# Patient Record
Sex: Female | Born: 1990 | Race: Black or African American | Hispanic: No | Marital: Single | Smoking: Current every day smoker
Health system: Southern US, Community
[De-identification: ages and names within clinical notes are randomized; demographics above are authoritative.]

## PROBLEM LIST (undated history)

## (undated) DIAGNOSIS — R011 Cardiac murmur, unspecified: Secondary | ICD-10-CM

## (undated) HISTORY — PX: NO PAST SURGERIES: SHX2092

---

## 2012-10-09 ENCOUNTER — Encounter (HOSPITAL_COMMUNITY): Payer: Self-pay | Admitting: *Deleted

## 2012-10-09 ENCOUNTER — Emergency Department (HOSPITAL_COMMUNITY)
Admission: EM | Admit: 2012-10-09 | Discharge: 2012-10-10 | Discharge status: Against Medical Advice | Payer: No Typology Code available for payment source | Attending: Emergency Medicine | Admitting: Emergency Medicine

## 2012-10-09 ENCOUNTER — Emergency Department (HOSPITAL_COMMUNITY): Payer: No Typology Code available for payment source

## 2012-10-09 DIAGNOSIS — R079 Chest pain, unspecified: Secondary | ICD-10-CM | POA: Insufficient documentation

## 2012-10-09 DIAGNOSIS — R5381 Other malaise: Secondary | ICD-10-CM | POA: Insufficient documentation

## 2012-10-09 DIAGNOSIS — F17200 Nicotine dependence, unspecified, uncomplicated: Secondary | ICD-10-CM | POA: Insufficient documentation

## 2012-10-09 DIAGNOSIS — R011 Cardiac murmur, unspecified: Secondary | ICD-10-CM | POA: Insufficient documentation

## 2012-10-09 DIAGNOSIS — R42 Dizziness and giddiness: Secondary | ICD-10-CM | POA: Insufficient documentation

## 2012-10-09 DIAGNOSIS — R55 Syncope and collapse: Secondary | ICD-10-CM | POA: Insufficient documentation

## 2012-10-09 HISTORY — DX: Cardiac murmur, unspecified: R01.1

## 2012-10-09 LAB — POCT I-STAT, CHEM 8
BUN: 8 mg/dL (ref 6–23)
HCT: 47 % — ABNORMAL HIGH (ref 36.0–46.0)
Hemoglobin: 16 g/dL — ABNORMAL HIGH (ref 12.0–15.0)
Sodium: 140 mEq/L (ref 135–145)
TCO2: 23 mmol/L (ref 0–100)

## 2012-10-09 LAB — CBC WITH DIFFERENTIAL/PLATELET
Basophils Absolute: 0 10*3/uL (ref 0.0–0.1)
Eosinophils Absolute: 0 10*3/uL (ref 0.0–0.7)
Lymphs Abs: 5.1 10*3/uL — ABNORMAL HIGH (ref 0.7–4.0)
MCH: 28.3 pg (ref 26.0–34.0)
Neutrophils Relative %: 55 % (ref 43–77)
Platelets: 230 10*3/uL (ref 150–400)
RBC: 5.23 MIL/uL — ABNORMAL HIGH (ref 3.87–5.11)
RDW: 12.7 % (ref 11.5–15.5)
WBC: 13.7 10*3/uL — ABNORMAL HIGH (ref 4.0–10.5)

## 2012-10-09 LAB — GLUCOSE, CAPILLARY: Glucose-Capillary: 128 mg/dL — ABNORMAL HIGH (ref 70–99)

## 2012-10-09 LAB — TROPONIN I: Troponin I: <0.3 ng/mL (ref <0.30)

## 2012-10-09 MED ORDER — SODIUM CHLORIDE 0.9 % IV BOLUS (SEPSIS)
1000.0000 mL | Freq: Once | INTRAVENOUS | Status: AC
  Administered 2012-10-10: 1000 mL via INTRAVENOUS

## 2012-10-09 MED ORDER — POTASSIUM CHLORIDE CRYS ER 20 MEQ PO TBCR
40.0000 meq | EXTENDED_RELEASE_TABLET | Freq: Once | ORAL | Status: AC
  Administered 2012-10-10: 40 meq via ORAL
  Filled 2012-10-09: qty 2

## 2012-10-09 NOTE — ED Notes (Signed)
Bed:WA14<BR> Expected date:<BR> Expected time:<BR> Means of arrival:<BR> Comments:<BR> EMS

## 2012-10-09 NOTE — ED Provider Notes (Signed)
History     CSN: [id number]  Arrival date & time 10/09/12  2125   First MD Initiated Contact with Patient 10/09/12 2211      Chief Complaint  Patient presents with  . Loss of Consciousness    (Consider location/radiation/quality/duration/timing/severity/associated sxs/prior treatment) HPI Comments: Patient states she had 2 syncopal episodes.  This evening.  She reports, that she had very little to eat or drink over the and when she went out in the heat.  She felt weak and dizzy during the first episode the second episode.  She had gone in to the house into the cool, but still had a syncopal episode denies any injury.  She does, state that she has a history of his having had 2 previous episodes of acute heat, and not eating.  She is currently on her menstrual cycle  Patient is a 22 y.o. female presenting with syncope. The history is provided by the patient.  Loss of Consciousness Episode history:  Multiple Most recent episode:  Today Timing:  Sporadic Progression:  Unable to specify Chronicity:  Recurrent Context: dehydration   Witnessed: yes   Relieved by:  Nothing Worsened by:  Nothing tried Ineffective treatments:  None tried Associated symptoms: chest pain   Associated symptoms: no dizziness, no fever, no headaches, no nausea, no palpitations, no shortness of breath, no vomiting and no weakness     Past Medical History  Diagnosis Date  . Heart murmur     History reviewed. No pertinent past surgical history.  History reviewed. No pertinent family history.  History  Substance Use Topics  . Smoking status: Current Every Day Smoker -- 1.00 packs/day    Types: Cigarettes  . Smokeless tobacco: Not on file  . Alcohol Use: Yes    OB History   Grav Para Term Preterm Abortions TAB SAB Ect Mult Living                  Review of Systems  Constitutional: Negative for fever and chills.  HENT: Negative for congestion and rhinorrhea.   Respiratory: Negative for cough and  shortness of breath.   Cardiovascular: Positive for chest pain and syncope. Negative for palpitations and leg swelling.  Gastrointestinal: Negative for nausea and vomiting.  Musculoskeletal: Negative for back pain.  Skin: Negative for rash.  Neurological: Negative for dizziness, weakness and headaches.  All other systems reviewed and are negative.    Allergies  Review of patient's allergies indicates no known allergies.  Home Medications  No current outpatient prescriptions on file.  BP 130/95  Pulse 76  Temp(Src) 98 F (36.7 C) (Oral)  Resp 23  SpO2 99%  LMP 10/09/2012  Physical Exam  Nursing note and vitals reviewed. Constitutional: She is oriented to person, place, and time. She appears well-nourished.  HENT:  Head: Normocephalic.  Eyes: Pupils are equal, round, and reactive to light.  Neck: Normal range of motion.  Cardiovascular: Normal rate and regular rhythm.   Pulmonary/Chest: Effort normal and breath sounds normal.  Abdominal: Soft. Bowel sounds are normal.  Musculoskeletal: Normal range of motion.  Neurological: She is alert and oriented to person, place, and time.  Skin: Skin is warm and dry.    ED Course  Procedures (including critical care time)  Labs Reviewed  GLUCOSE, CAPILLARY - Abnormal; Notable for the following:    Glucose-Capillary 128 (*)    All other components within normal limits  CBC WITH DIFFERENTIAL - Abnormal; Notable for the following:    WBC 13.7 (*)  RBC 5.23 (*)    Lymphs Abs 5.1 (*)    Monocytes Absolute 1.1 (*)    All other components within normal limits  POCT I-STAT, CHEM 8 - Abnormal; Notable for the following:    Potassium 3.3 (*)    Hemoglobin 16.0 (*)    HCT 47.0 (*)    All other components within normal limits  TROPONIN I   Ct Head Wo Contrast  10/09/2012   *RADIOLOGY REPORT*  Clinical Data: Syncope.  CT HEAD WITHOUT CONTRAST  Technique:  Contiguous axial images were obtained from the base of the skull through the  vertex without contrast.  Comparison: None  Findings: The ventricles are normal.  No extra-axial fluid collections are seen.  The brainstem and cerebellum are unremarkable.  No acute intracranial findings such as infarction or hemorrhage.  No mass lesions.  The bony calvarium is intact.  The visualized paranasal sinuses and mastoid air cells are clear.  IMPRESSION: No acute intracranial findings or mass lesions.   Original Report Authenticated By: Rudie Meyer, M.D.     1. Syncope     ED ECG REPORT   Date: 10/10/2012  EKG Time: 12:56 AM  Rate: 80  Rhythm: normal sinus rhythm,  there are no previous tracings available for comparison  Axis: normal  Intervals:none  ST&T Change: normal  Narrative Interpretation: normal            MDM  Patient left before completion of exam        Arman Filter, NP 10/10/12 [phone]

## 2012-10-09 NOTE — ED Notes (Signed)
EMS called to home.  Found patient laying supine after syncopal episode. Witnesses state that this is the second time today that this had happen. No seizure activity noted.  Patient was alert and oriented x2.   Patient states that during hot season she has had this type of issue before

## 2012-10-10 NOTE — ED Notes (Signed)
Patient left AMA.  She states that she don't have time to sit here all day and she needed to go back [place].  She was informed that she needed to stay to finish her evaluation an she could return if she needed

## 2012-10-12 NOTE — ED Provider Notes (Signed)
Medical screening examination/treatment/procedure(s) were performed by non-physician practitioner and as supervising physician I was immediately available for consultation/collaboration.   Esten Dollar W. Danford Tat, MD 10/12/12 0754 

## 2013-09-22 ENCOUNTER — Telehealth (HOSPITAL_COMMUNITY): Payer: Self-pay | Admitting: *Deleted

## 2013-09-22 NOTE — Telephone Encounter (Signed)
Telephoned patient and left message to return call to [facility] 

## 2013-11-14 ENCOUNTER — Other Ambulatory Visit: Payer: Self-pay | Admitting: Obstetrics and Gynecology

## 2013-11-14 DIAGNOSIS — R229 Localized swelling, mass and lump, unspecified: Principal | ICD-10-CM

## 2013-11-14 DIAGNOSIS — IMO0002 Reserved for concepts with insufficient information to code with codable children: Secondary | ICD-10-CM

## 2013-11-22 ENCOUNTER — Ambulatory Visit (HOSPITAL_COMMUNITY)
Admission: RE | Admit: 2013-11-22 | Discharge: 2013-11-22 | Disposition: A | Discharge status: Home / Self Care | Payer: PRIVATE HEALTH INSURANCE | Source: Ambulatory Visit | Attending: Obstetrics and Gynecology | Admitting: Obstetrics and Gynecology

## 2013-11-22 ENCOUNTER — Ambulatory Visit: Payer: PRIVATE HEALTH INSURANCE

## 2013-12-13 ENCOUNTER — Other Ambulatory Visit: Payer: PRIVATE HEALTH INSURANCE

## 2013-12-13 ENCOUNTER — Ambulatory Visit (HOSPITAL_COMMUNITY): Payer: PRIVATE HEALTH INSURANCE

## 2015-01-24 ENCOUNTER — Emergency Department (HOSPITAL_BASED_OUTPATIENT_CLINIC_OR_DEPARTMENT_OTHER)
Admission: EM | Admit: 2015-01-24 | Discharge: 2015-01-24 | Disposition: A | Discharge status: Home / Self Care | Payer: Self-pay | Attending: Emergency Medicine | Admitting: Emergency Medicine

## 2015-01-24 ENCOUNTER — Encounter (HOSPITAL_BASED_OUTPATIENT_CLINIC_OR_DEPARTMENT_OTHER): Payer: Self-pay

## 2015-01-24 DIAGNOSIS — R590 Localized enlarged lymph nodes: Secondary | ICD-10-CM | POA: Insufficient documentation

## 2015-01-24 DIAGNOSIS — R011 Cardiac murmur, unspecified: Secondary | ICD-10-CM | POA: Insufficient documentation

## 2015-01-24 DIAGNOSIS — Z720 Tobacco use: Secondary | ICD-10-CM | POA: Insufficient documentation

## 2015-01-24 MED ORDER — DOXYCYCLINE HYCLATE 100 MG PO CAPS: 100.0000 mg | ORAL_CAPSULE | Freq: Two times a day (BID) | ORAL | Status: DC

## 2015-01-24 NOTE — Discharge Instructions (Signed)
Lymphadenopathy °Lymphadenopathy refers to swollen or enlarged lymph glands, also called lymph nodes. Lymph glands are part of your body's defense (immune) system, which protects the body from infections, germs, and diseases. Lymph glands are found in many locations in your body, including the neck, underarm, and groin.  °Many things can cause lymph glands to become enlarged. When your immune system responds to germs, such as viruses or bacteria, infection-fighting cells and fluid build up. This causes the glands to grow in size. Usually, this is not something to worry about. The swelling and any soreness often go away without treatment. However, swollen lymph glands can also be caused by a number of diseases. Your health care provider may do various tests to help determine the cause. If the cause of your swollen lymph glands cannot be found, it is important to monitor your condition to make sure the swelling goes away. °HOME CARE INSTRUCTIONS °Watch your condition for any changes. The following actions may help to lessen any discomfort you are feeling: °· Get plenty of rest. °· Take medicines only as directed by your health care provider. Your health care provider may recommend over-the-counter medicines for pain. °· Apply moist heat compresses to the site of swollen lymph nodes as directed by your health care provider. This can help reduce any pain. °· Check your lymph nodes daily for any changes. °· Keep all follow-up visits as directed by your health care provider. This is important. °SEEK MEDICAL CARE IF: °· Your lymph nodes are still swollen after 2 weeks. °· Your swelling increases or spreads to other areas. °· Your lymph nodes are hard, seem fixed to the skin, or are growing rapidly. °· Your skin over the lymph nodes is red and inflamed. °· You have a fever. °· You have chills. °· You have fatigue. °· You develop a sore throat. °· You have abdominal pain. °· You have weight loss. °· You have night  sweats. °SEEK IMMEDIATE MEDICAL CARE IF: °· You notice fluid leaking from the area of the enlarged lymph node. °· You have severe pain in any area of your body. °· You have chest pain. °· You have shortness of breath. °  °This information is not intended to replace advice given to you by your health care provider. Make sure you discuss any questions you have with your health care provider. °  °Document Released: 01/15/2008 Document Revised: 04/28/2014 Document Reviewed: 11/10/2013 °Elsevier Interactive Patient Education ©2016 Elsevier Inc. ° ° °Emergency Department Resource Guide °1) Find a Doctor and Pay Out of Pocket °Although you won't have to find out who is covered by your insurance plan, it is a good idea to ask around and get recommendations. You will then need to call the office and see if the doctor you have chosen will accept you as a new patient and what types of options they offer for patients who are self-pay. Some doctors offer discounts or will set up payment plans for their patients who do not have insurance, but you will need to ask so you aren't surprised when you get to your appointment. ° °2) Contact Your Local Health Department °Not all health departments have doctors that can see patients for sick visits, but many do, so it is worth a call to see if yours does. If you don't know where your local health department is, you can check in your phone book. The CDC also has a tool to help you locate your state's health department, and many state websites   have listings of all of their local health departments. ° °3) Find a Walk-in Clinic °If your illness is not likely to be very severe or complicated, you may want to try a walk in clinic. These are popping up all over the country in pharmacies, drugstores, and shopping centers. They're usually staffed by nurse practitioners or physician assistants that have been trained to treat common illnesses and complaints. They're usually fairly quick and  inexpensive. However, if you have serious medical issues or chronic medical problems, these are probably not your best option. ° °No Primary Care Doctor: °- Call [facility] at  832-8000 - they can help you locate a primary care doctor that  accepts your insurance, provides certain services, etc. °- Physician Referral Service- 1-800-533-3463 ° °Chronic Pain Problems: °Organization         Address  Phone   Notes  °[facility]  (336) 297-2271 Patients need to be referred by their primary care doctor.  ° °Medication Assistance: °Organization         Address  Phone   Notes  °[place] [facility] [address] °[place], [place] [address] (336) 641-8030 --Must be a resident of [place] °-- Must have NO insurance coverage whatsoever (no Medicaid/ Medicare, etc.) °-- The pt. MUST have a primary care doctor that directs their care regularly and follows them in the community °  °[facility]  (866) 331-1348   °[employer]  (888) 892-1162   ° °Agencies that provide inexpensive medical care: °Organization         Address  Phone   Notes  °[facility]  (336) 832-8035   °[facility]    (336) 832-7272   °[facility] [address] °[place], [place] [address] (336) 832-4777   °[facility] [address], °[place] (336) 271-4999   °[facility]    (336) 373-0678   °[facility]    (336) 272-1050   °[facility] ° [address], [place] Phone:  (336) 832-4444, Fax:  (336) 832-4440 Hours of Operation:  9 am - 6 pm, M-F.  Also accepts Medicaid/Medicare and self-pay.  °[facility] ° [address], [place] Phone: (336) 832-3150, Fax: (336) [phone]. Hours of Operation:  8:30 am - 5:30 pm, M-F.  Also accepts Medicaid and self-pay.  °[facility] [place] [address], [place] Phone: (336) 878-6027   °[facility] [address], [place], [place] (336)723-1848, Ext. 123 Mondays & Thursdays: 7-9 AM.  First 15 patients are seen on a first come, first serve basis. °  ° °Medicaid-accepting [place] Providers: ° °Organization         Address  Phone   Notes  °[facility] [address], [place] (336) 641-2100 Also accepts self-pay patients.  °[facility] [address], [place] ° (336) 856-9996   °[facility] [address], [place] (336) 288-8857   °[facility] [address] [place] [address], [place] (336) 299-7000   °Veita Bland [address], [place]  ° (336) 373-1557 Only accepts [place] Access Medicaid patients after they have their name applied to their card.  ° °Self-Pay (no insurance) in [place]: ° °Organization         Address  Phone     Notes  °Sickle Cell Patients, [facility] [address], [place] (336) 832-1970   °[facility] [address], [place] (336) 832-4400   °[facility] ° [address], [place] (336) 992-4800   °[facility]/Dr. Osei-Bonsu ° [address] [place] [address], [place] or [address], [place] (336) 841-8500 Phone number for both [place] and [place] locations is the same.  °[facility] [address], [place] (336) 299-0000   °[facility] [address] [place] [address], [place] or [address] (336) 852-7530 °(336) 878-2260   °[facility] [address], [place] (336) 350-1642, phone; (336) 294-5005, fax Sees patients 1st and 3rd Saturday of every month.  Must not qualify for public or private insurance (i.e. Medicaid, Medicare, [place] Health Choice, Veterans' Benefits) • Household income should be no more than 200% of the poverty level •The clinic cannot treat you if you are pregnant or  think you are pregnant • Sexually transmitted diseases are not treated at the clinic.  ° ° °Dental Care: °Organization         Address  Phone  Notes  °[facility] [address], [place] (336) 641-6152 Accepts children up to age 21 who are enrolled in Medicaid or [place] Health Choice; pregnant women with a Medicaid card; and children who have applied for Medicaid or [place] Health Choice, but were declined, whose parents can pay a reduced fee at time of service.  °[facility] [place]  [address], [place] (336) 641-7733 Accepts children up to age 21 who are enrolled in Medicaid or [place] Health Choice; pregnant women with a Medicaid card; and children who have applied for Medicaid or [place] Health Choice, but were declined, whose parents can pay a reduced fee at time of service.  °[facility] ° [address], [place] (336) 641-4533 Patients are seen by appointment only. Walk-ins are not accepted. [facility] will see patients 18 years of age and older. °Monday - Tuesday (8am-5pm) °Most Wednesdays (8:30-5pm) °$30 per visit, cash only  °[facility] ° [address], [place] (336) 641-4533 Patients are seen by appointment only. Walk-ins are not accepted. [facility] will see patients 18 years of age and older. °One Wednesday Evening (Monthly: Volunteer Based).  $30 per visit, cash only  °[facility]  (919) 537-3737 for adults; Children under age 4, call Graduate Pediatric Dentistry at (919) [phone]. Children aged 4-14, please call (919) [phone] to request a pediatric application. ° Dental services are provided in all areas of dental care including fillings, crowns and bridges, complete and partial dentures, implants, gum treatment, root canals, and extractions. Preventive care is also provided. Treatment is provided to both adults  and children. °Patients are selected via a lottery and there is often a waiting list. °  °[facility] [address], °[place] ° (336) 763-8833 [email or web address] °  °[facility] [address], [place], [place] (336)723-1848, Ext. 123 Second and Fourth Thursday of each month, opens at 6:30 AM; Clinic ends at 9 AM.  Patients are seen on a first-come first-served basis, and a limited number are seen during each clinic.  ° °[facility] ° [address], [place], [place] (336) 723-7904     Eligibility Requirements °You must have lived in [place], [place], or [place] counties for at least the last three months. °  You cannot be eligible for state or federal sponsored healthcare insurance, including Veterans Administration, Medicaid, or Medicare. °  You generally cannot be eligible for healthcare insurance through your employer.  °  How to apply: °Eligibility screenings are held every Tuesday and Wednesday afternoon from 1:00 pm until 4:00 pm. You do not need an appointment for the interview!  °[facility] [address], [place], [place] 336-631-2330   °[facility]  336-342-8273   °[facility]  336-703-3100   °[facility]  336-570-6415   ° °Behavioral Health Resources in the Community: °Intensive Outpatient Programs °Organization         Address  Phone  Notes  °[facility] [address], [place], [place] 336-878-6098   °[facility] [address], [place], [place] 336-832-9800   °[facility] [address], [place], [place] ° 336-882-2125   °[facility] [address],  °[place], [place] 1-800-853-5163 or 336-641-4981   °Substance Abuse Resources °Organization         Address  Phone  Notes  °[facility]  336-882-2125   °[facility]  336-784-9470   °[facility]  336-285-9073     °[facility]  336-845-3988   °Residential & Outpatient Substance Abuse Program  1-800-659-3381   °Psychological Services °Organization         Address  Phone  Notes  °[facility]  336- 832-9600   °[facility]  336- 378-7881   °[facility] [address], [place] 1-800-853-5163 or 336-641-4981   ° °Mobile Crisis Teams °Organization         Address  Phone  Notes  °[facility], Mobile Crisis Care Unit  1-877-626-1772   °Assertive °[facility] ° [address] [place], [place] 336-834-9664   °Sharon DeEsch [address] °[place] 336-554-5454   ° °Self-Help/Support Groups °Organization         Address  Phone             Notes  °[facility] - variety of support groups  336- 373-1402 Call for more information  °Narcotics Anonymous (NA), [facility] [address], °[place]  2 meetings at this location  ° °Residential Treatment Programs °Organization         Address  Phone  Notes  °[facility] [address],    °[place]  1-866-801-8205   °[facility] ° [address], [place], [place] 704-293-8524   °[facility] [address], [place] 336-845-3988 Admissions: 8am-3pm M-F  °[facility] [address]    °[place], [place] 336-841-1104   °[facility] [address], [place], [place] 336-379-7146   °[facility] [address]  °[place], [place] 336-285-9073   °[facility] - Intensive Outpatient [address], [place], [place] 336-852-3033   °[facility] [address]  °[place], [place] 1-877-615-2722 or 336-784-9470   °[facility] [address] [place], [place] 336-227-7417 Accepts Medicaid  °[facility] [address]  °[place]  1-800-659-3381 Substance Abuse/Addiction Treatment  ° °[place]  Behavioral Health Resources °Organization           Address  Phone  Notes  °[facility]  (888) 581-9988   °Julie Brannon, PhD [address] [place], [place]   (336) 349-5553 or (336) 951-0000   °[facility]   [address] °[place], [place] (336) 349-4454   °[facility] [address], [place], [place] (336) 342-8316 Insurance/Medicaid/sponsorship through [facility]  °[facility] [address]                                    [place], [place] (336) 342-8316 Therapy/tele-psych/case  °[facility] [address]  ° [place], [place] (336) 349-2233    °Dr. Arfeen  (336) 349-4544   °[facility] [place]  [employer] [place] [facility] 1) [address], [place] °2) [address], [place] °3)  [address], [place] (336) 349-3220 °(336) 342-7768 ° °(336) 342-8140   °[place] [facility] (336) 342-1394 or (336) 342-3537 (After Hours)    ° ° ° °

## 2015-01-24 NOTE — ED Provider Notes (Signed)
CSN: [id number]     Arrival date & time 01/24/15  1344 History   First MD Initiated Contact with Patient 01/24/15 1403     Chief Complaint  Patient presents with  . Lymphadenopathy     HPI  Patient presents evaluation of tenderness and swelling of the right neck. Noticed a tender area and area states when she looks in the mirror she feels as though it is more swollen. Coworker pointed out to her that the right side of her neck looked "swollen" no sore throat. She does have a painful left maxillary third molar. No additional signs of head and neck infection. No fever chills night sweats weight loss. No swelling in the axilla groin or abdominal pain.  Past Medical History  Diagnosis Date  . Heart murmur    History reviewed. No pertinent past surgical history. No family history on file. Social History  Substance Use Topics  . Smoking status: Current Every Day Smoker -- 1.00 packs/day    Types: Cigarettes  . Smokeless tobacco: None  . Alcohol Use: No   OB History    No data available     Review of Systems  Constitutional: Negative for fever, chills, diaphoresis, appetite change and fatigue.  HENT: Negative for mouth sores, sore throat and trouble swallowing.        Soft tissue swelling and tenderness in the lower anterior right lateral neck.  Eyes: Negative for visual disturbance.  Respiratory: Negative for cough, chest tightness, shortness of breath and wheezing.   Cardiovascular: Negative for chest pain.  Gastrointestinal: Negative for nausea, vomiting, abdominal pain, diarrhea and abdominal distention.  Endocrine: Negative for polydipsia, polyphagia and polyuria.  Genitourinary: Negative for dysuria, frequency and hematuria.  Musculoskeletal: Negative for gait problem.  Skin: Negative for color change, pallor and rash.  Neurological: Negative for dizziness, syncope, light-headedness and headaches.  Hematological: Does not bruise/bleed easily.  Psychiatric/Behavioral:  Negative for behavioral problems and confusion.      Allergies  Review of patient's allergies indicates no known allergies.  Home Medications   Prior to Admission medications   Medication Sig Start Date End Date Taking? Authorizing Provider  doxycycline (VIBRAMYCIN) 100 MG capsule Take 1 capsule (100 mg total) by mouth 2 (two) times daily. 01/24/15   Rolland Porter, MD   BP 129/71 mmHg  Pulse 101  Temp(Src) 98.5 F (36.9 C) (Oral)  Resp 20  Ht  (1.702 m)  Wt 200 lb (90.719 kg)  BMI 31.32 kg/m2  SpO2 100%  LMP 12/25/2014 Physical Exam  Constitutional: She is oriented to person, place, and time. She appears well-developed and well-nourished. No distress.  HENT:  Head: Normocephalic.  Eyes: Conjunctivae are normal. Pupils are equal, round, and reactive to light. No scleral icterus.  Neck: Normal range of motion. Neck supple. No thyromegaly present.    Cardiovascular: Normal rate and regular rhythm.  Exam reveals no gallop and no friction rub.   No murmur heard. Pulmonary/Chest: Effort normal and breath sounds normal. No respiratory distress. She has no wheezes. She has no rales.  Abdominal: Soft. Bowel sounds are normal. She exhibits no distension. There is no tenderness. There is no rebound.  Musculoskeletal: Normal range of motion.  Neurological: She is alert and oriented to person, place, and time.  Skin: Skin is warm and dry. No rash noted.  Psychiatric: She has a normal mood and affect. Her behavior is normal.    ED Course  Procedures (including critical care time) Labs Review Labs Reviewed -  No data to display  Imaging Review No results found. I have personally reviewed and evaluated these images and lab results as part of my medical decision-making.   EKG Interpretation None      MDM   Final diagnoses:  Cervical lymphadenopathy    She has some enlargement of her thyroid.  However, area of tenderness is distinctly from adjacent anterior cervical lymph  node. No posterior cervical nodes. No axillary or supraclavicular nodes. No hepatosplenomegaly or abdominal pain. Essentially symptomatic enlargement of cervical lymph nodes. Plan continue course doxycycline. Reevaluation primary care for not resolved.    Rolland Porter, MD 01/24/15 1440

## 2015-01-24 NOTE — ED Notes (Signed)
C/o swelling to right side of neck x 1 week

## 2015-01-28 ENCOUNTER — Emergency Department (HOSPITAL_BASED_OUTPATIENT_CLINIC_OR_DEPARTMENT_OTHER): Payer: Self-pay

## 2015-01-28 ENCOUNTER — Encounter (HOSPITAL_BASED_OUTPATIENT_CLINIC_OR_DEPARTMENT_OTHER): Payer: Self-pay | Admitting: Emergency Medicine

## 2015-01-28 ENCOUNTER — Emergency Department (HOSPITAL_BASED_OUTPATIENT_CLINIC_OR_DEPARTMENT_OTHER)
Admission: EM | Admit: 2015-01-28 | Discharge: 2015-01-28 | Disposition: A | Discharge status: Home / Self Care | Payer: Self-pay | Attending: Emergency Medicine | Admitting: Emergency Medicine

## 2015-01-28 DIAGNOSIS — T364X5A Adverse effect of tetracyclines, initial encounter: Secondary | ICD-10-CM | POA: Insufficient documentation

## 2015-01-28 DIAGNOSIS — Z792 Long term (current) use of antibiotics: Secondary | ICD-10-CM | POA: Insufficient documentation

## 2015-01-28 DIAGNOSIS — R011 Cardiac murmur, unspecified: Secondary | ICD-10-CM | POA: Insufficient documentation

## 2015-01-28 DIAGNOSIS — K208 Other esophagitis: Secondary | ICD-10-CM | POA: Insufficient documentation

## 2015-01-28 DIAGNOSIS — Z720 Tobacco use: Secondary | ICD-10-CM | POA: Insufficient documentation

## 2015-01-28 DIAGNOSIS — T462X5A Adverse effect of other antidysrhythmic drugs, initial encounter: Secondary | ICD-10-CM

## 2015-01-28 MED ORDER — OMEPRAZOLE 20 MG PO CPDR: 20.0000 mg | DELAYED_RELEASE_CAPSULE | Freq: Every day | ORAL | Status: DC

## 2015-01-28 MED ORDER — SUCRALFATE 1 GM/10ML PO SUSP: 1.0000 g | Freq: Three times a day (TID) | ORAL | Status: DC

## 2015-01-28 MED ORDER — ONDANSETRON 4 MG PO TBDP
4.0000 mg | ORAL_TABLET | Freq: Once | ORAL | Status: AC
  Administered 2015-01-28: 4 mg via ORAL
  Filled 2015-01-28: qty 1

## 2015-01-28 MED ORDER — GI COCKTAIL ~~LOC~~
30.0000 mL | Freq: Once | ORAL | Status: AC
  Administered 2015-01-28: 30 mL via ORAL
  Filled 2015-01-28: qty 30

## 2015-01-28 NOTE — ED Provider Notes (Signed)
CSN: [id number]     Arrival date & time 01/28/15  4098 History   First MD Initiated Contact with Patient 01/28/15 [phone]     Chief Complaint  Patient presents with  . Dysphagia  . Chest Pain     (Consider location/radiation/quality/duration/timing/severity/associated sxs/prior Treatment) Patient is a 24 y.o. female presenting with chest pain. The history is provided by the patient.  Chest Pain Pain location:  Epigastric Pain quality: burning   Pain radiates to:  Does not radiate Pain radiates to the back: no   Pain severity:  Moderate Onset quality:  Gradual Duration:  1 day Timing:  Constant Progression:  Unchanged Chronicity:  New Context comment:  Took docycycline pill, layed down, felt pain Relieved by:  Nothing Worsened by:  Nothing tried Associated symptoms: abdominal pain (mild upper)     Past Medical History  Diagnosis Date  . Heart murmur    History reviewed. No pertinent past surgical history. No family history on file. Social History  Substance Use Topics  . Smoking status: Current Every Day Smoker -- 1.00 packs/day    Types: Cigarettes  . Smokeless tobacco: None  . Alcohol Use: No   OB History    No data available     Review of Systems  Cardiovascular: Positive for chest pain.  Gastrointestinal: Positive for abdominal pain (mild upper).  All other systems reviewed and are negative.     Allergies  Review of patient's allergies indicates no known allergies.  Home Medications   Prior to Admission medications   Medication Sig Start Date End Date Taking? Authorizing Provider  doxycycline (VIBRAMYCIN) 100 MG capsule Take 1 capsule (100 mg total) by mouth 2 (two) times daily. 01/24/15   Rolland Porter, MD   BP 114/73 mmHg  Pulse 84  Temp(Src) 98.5 F (36.9 C) (Oral)  Resp 20  Ht  (1.702 m)  Wt 200 lb (90.719 kg)  BMI 31.32 kg/m2  SpO2 99%  LMP 12/25/2014 Physical Exam  Constitutional: She is oriented to person, place, and time. She appears  well-developed and well-nourished. No distress.  HENT:  Head: Normocephalic.  Eyes: Conjunctivae are normal.  Neck: Neck supple. No tracheal deviation present.  Cardiovascular: Normal rate, regular rhythm and normal heart sounds.   Pulmonary/Chest: Effort normal and breath sounds normal. No respiratory distress. She has no wheezes. She has no rales. She exhibits no tenderness.  Abdominal: Soft. She exhibits no distension. There is no tenderness. There is no rebound and no guarding.  Neurological: She is alert and oriented to person, place, and time.  Skin: Skin is warm and dry.  Psychiatric: She has a normal mood and affect.    ED Course  Procedures (including critical care time) Labs Review Labs Reviewed - No data to display  Imaging Review Dg Chest 2 View  01/28/2015   CLINICAL DATA:  Chest pain.  EXAM: CHEST  2 VIEW  COMPARISON:  July 21, 2014.  FINDINGS: The heart size and mediastinal contours are within normal limits. Both lungs are clear. No pneumothorax or pleural effusion is noted. The visualized skeletal structures are unremarkable.  IMPRESSION: No active cardiopulmonary disease.   Electronically Signed   By: Lupita Raider, M.D.   On: 01/28/2015 10:37   I have personally reviewed and evaluated these images and lab results as part of my medical decision-making.   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 No signs of perforation clinically or on chest XR. Given gi cocktail with improvement of symptoms. Doxy was empiric therapy for undifferentiated lymphadenitis so will remove offending agent. Started on PPI and carafate to protect mucosal lining. Afebrile, not septic appearing, do not feel further emergent workup of adenitis is warranted. Plan to follow up with a PCP as needed and return precautions discussed for worsening or new concerning  symptoms.    Lyndal Pulley, MD 01/29/15 [phone]

## 2015-01-28 NOTE — Discharge Instructions (Signed)
Esophagitis °Esophagitis is inflammation of the esophagus. The esophagus is the tube that carries food and liquids from your mouth to your stomach. Esophagitis can cause soreness or pain in the esophagus. This condition can make it difficult and painful to swallow.  °CAUSES °Most causes of esophagitis are not serious. Common causes of this condition include: °· Gastroesophageal reflux disease (GERD). This is when stomach contents move back up into the esophagus (reflux). °· Repeated vomiting. °· An allergic-type reaction, especially caused by food allergies (eosinophilic esophagitis). °· Injury to the esophagus by swallowing large pills with or without water, or swallowing certain types of medicines. °· Swallowing (ingesting) harmful chemicals, such as household cleaning products. °· Heavy alcohol use. °· An infection of the esophagus. This most often occurs in people who have a weakened immune system. °· Radiation or chemotherapy treatment for cancer. °· Certain diseases such as sarcoidosis, Crohn disease, and scleroderma. °SYMPTOMS °Symptoms of this condition include: °· Difficult or painful swallowing. °· Pain with swallowing acidic liquids, such as citrus juices. °· Pain with burping. °· Chest pain. °· Difficulty breathing. °· Nausea. °· Vomiting. °· Pain in the abdomen. °· Weight loss. °· Ulcers in the mouth. °· Patches of white material in the mouth (candidiasis). °· Fever. °· Coughing up blood or vomiting blood. °· Stool that is black, tarry, or bright red. °DIAGNOSIS °Your health care provider will take a medical history and perform a physical exam. You may also have other tests, including: °· An endoscopy to examine your stomach and esophagus with a small camera. °· A test that measures the acidity level in your esophagus. °· A test that measures how much pressure is on your esophagus. °· A barium swallow or modified barium swallow to show the shape, size, and functioning of your esophagus. °· Allergy  tests. °TREATMENT °Treatment for this condition depends on the cause of your esophagitis. In some cases, steroids or other medicines may be given to help relieve your symptoms or to treat the underlying cause of your condition. You may have to make some lifestyle changes, such as: °· Avoiding alcohol. °· Quitting smoking. °· Changing your diet. °· Exercising. °· Changing your sleep habits and your sleep environment. °HOME CARE INSTRUCTIONS °Take these actions to decrease your discomfort and to help avoid complications. °Diet °· Follow a diet as recommended by your health care provider. This may involve avoiding foods and drinks such as: °¨ Coffee and tea (with or without caffeine). °¨ Drinks that contain alcohol. °¨ Energy drinks and sports drinks. °¨ Carbonated drinks or sodas. °¨ Chocolate and cocoa. °¨ Peppermint and mint flavorings. °¨ Garlic and onions. °¨ Horseradish. °¨ Spicy and acidic foods, including peppers, chili powder, curry powder, vinegar, hot sauces, and barbecue sauce. °¨ Citrus fruit juices and citrus fruits, such as oranges, lemons, and limes. °¨ Tomato-based foods, such as red sauce, chili, salsa, and pizza with red sauce. °¨ Fried and fatty foods, such as donuts, french fries, potato chips, and high-fat dressings. °¨ High-fat meats, such as hot dogs and fatty cuts of red and white meats, such as rib eye steak, sausage, ham, and bacon. °¨ High-fat dairy items, such as whole milk, butter, and cream cheese. °· Eat small, frequent meals instead of large meals. °· Avoid drinking large amounts of liquid with your meals. °· Avoid eating meals during the 2-3 hours before bedtime. °· Avoid lying down right after you eat. °· Do not exercise right after you eat. °· Avoid foods and drinks that seem to   make your symptoms worse. °General Instructions °· Pay attention to any changes in your symptoms. °· Take over-the-counter and prescription medicines only as told by your health care provider. Do not take  aspirin, ibuprofen, or other NSAIDs unless your health care provider told you to do so. °· If you have trouble taking pills, use a pill splitter to decrease the size of the pill. This will decrease the chance of the pill getting stuck or injuring your esophagus on the way down. Also, drink water after you take a pill. °· Do not use any tobacco products, including cigarettes, chewing tobacco, and e-cigarettes. If you need help quitting, ask your health care provider. °· Wear loose-fitting clothing. Do not wear anything tight around your waist that causes pressure on your abdomen. °· Raise (elevate) the head of your bed about 6 inches (15 cm). °· Try to reduce your stress, such as with yoga or meditation. If you need help reducing stress, ask your health care provider. °· If you are overweight, reduce your weight to an amount that is healthy for you. Ask your health care provider for guidance about a safe weight loss goal. °· Keep all follow-up visits as told by your health care provider. This is important. °SEEK MEDICAL CARE IF: °· You have new symptoms. °· You have unexplained weight loss. °· You have difficulty swallowing, or it hurts to swallow. °· You have wheezing or a persistent cough. °· Your symptoms do not improve with treatment. °· You have frequent heartburn for more than two weeks. °SEEK IMMEDIATE MEDICAL CARE IF: °· You have severe pain in your arms, neck, jaw, teeth, or back. °· You feel sweaty, dizzy, or light-headed. °· You have chest pain or shortness of breath. °· You vomit and your vomit looks like blood or coffee grounds. °· Your stool is bloody or black. °· You have a fever. °· You cannot swallow, drink, or eat. °  °This information is not intended to replace advice given to you by your health care provider. Make sure you discuss any questions you have with your health care provider. °  °Document Released: 05/15/2004 Document Revised: 12/27/2014 Document Reviewed: 08/02/2014 °Elsevier Interactive  Patient Education ©2016 [employer] ° °

## 2015-01-28 NOTE — ED Notes (Signed)
Pt seen on weds for swollen neck glands and last night began having chest pain related to swallowing food or drink.  Pt states it "feels like something is stuck" and has been nauseous and vomiting off and on since weds.

## 2015-04-10 ENCOUNTER — Emergency Department (HOSPITAL_BASED_OUTPATIENT_CLINIC_OR_DEPARTMENT_OTHER)
Admission: EM | Admit: 2015-04-10 | Discharge: 2015-04-10 | Disposition: A | Discharge status: Home / Self Care | Payer: Self-pay | Attending: Emergency Medicine | Admitting: Emergency Medicine

## 2015-04-10 ENCOUNTER — Encounter (HOSPITAL_BASED_OUTPATIENT_CLINIC_OR_DEPARTMENT_OTHER): Payer: Self-pay

## 2015-04-10 DIAGNOSIS — R011 Cardiac murmur, unspecified: Secondary | ICD-10-CM | POA: Insufficient documentation

## 2015-04-10 DIAGNOSIS — Z79899 Other long term (current) drug therapy: Secondary | ICD-10-CM | POA: Insufficient documentation

## 2015-04-10 DIAGNOSIS — K029 Dental caries, unspecified: Secondary | ICD-10-CM | POA: Insufficient documentation

## 2015-04-10 DIAGNOSIS — F17210 Nicotine dependence, cigarettes, uncomplicated: Secondary | ICD-10-CM | POA: Insufficient documentation

## 2015-04-10 MED ORDER — PENICILLIN V POTASSIUM 500 MG PO TABS: 500.0000 mg | ORAL_TABLET | Freq: Three times a day (TID) | ORAL | Status: AC

## 2015-04-10 MED ORDER — ETODOLAC 500 MG PO TABS: 500.0000 mg | ORAL_TABLET | Freq: Two times a day (BID) | ORAL | Status: DC

## 2015-04-10 NOTE — ED Provider Notes (Signed)
CSN: [id number]     Arrival date & time 04/10/15  [id number] History   First MD Initiated Contact with Patient 04/10/15 [phone]     Chief Complaint  Patient presents with  . Dental Pain   HPI Pt has been having trouble with tooth pain for at least the last week.  Pt was seen at [facility].  She was referred to a dentist.  She is scheduled for oral surgery.  She saw the dentist this am.  Pt states they didn't do anything for her pain.  The pain goes to her neck area.  It hurts to chew and press on that side of her face. Past Medical History  Diagnosis Date  . Heart murmur    History reviewed. No pertinent past surgical history. No family history on file. Social History  Substance Use Topics  . Smoking status: Current Every Day Smoker -- 1.00 packs/day    Types: Cigarettes  . Smokeless tobacco: None  . Alcohol Use: No   OB History    No data available     Review of Systems  All other systems reviewed and are negative.     Allergies  Review of patient's allergies indicates no known allergies.  Home Medications   Prior to Admission medications   Medication Sig Start Date End Date Taking? Authorizing Provider  etodolac (LODINE) 500 MG tablet Take 1 tablet (500 mg total) by mouth 2 (two) times daily. 04/10/15   Linwood DibblesJon Jaikob Borgwardt, MD  omeprazole (PRILOSEC) 20 MG capsule Take 1 capsule (20 mg total) by mouth daily. 01/28/15   Lyndal Pulleyaniel Knott, MD  penicillin v potassium (VEETID) 500 MG tablet Take 1 tablet (500 mg total) by mouth 3 (three) times daily. 04/10/15 04/17/15  Linwood DibblesJon Sherie Dobrowolski, MD  sucralfate (CARAFATE) 1 GM/10ML suspension Take 10 mLs (1 g total) by mouth 4 (four) times daily -  with meals and at bedtime. 01/28/15   Lyndal Pulleyaniel Knott, MD   BP 128/92 mmHg  Pulse 82  Temp(Src) 98.2 F (36.8 C) (Oral)  Resp 16  Ht 5\' 7"  (1.702 m)  Wt 90.719 kg  BMI 31.32 kg/m2  SpO2 100%  LMP 03/17/2015 Physical Exam  Constitutional: She appears well-developed and well-nourished. No distress.  HENT:   Head: Normocephalic and atraumatic.  Right Ear: External ear normal.  Left Ear: External ear normal.  Mouth/Throat: No oropharyngeal exudate.  Dental caries left upper molar, tooth decayed to the gums, no swelling   Eyes: Conjunctivae are normal. Right eye exhibits no discharge. Left eye exhibits no discharge. No scleral icterus.  Neck: Neck supple. No tracheal deviation present.  No mass or significant lymphadenopathy, ttp,   Cardiovascular: Normal rate and regular rhythm.  Exam reveals no friction rub.   No murmur heard. Pulmonary/Chest: Effort normal and breath sounds normal. No stridor. No respiratory distress. She has no wheezes.  Musculoskeletal: She exhibits no edema.  Neurological: She is alert. Cranial nerve deficit: no gross deficits.  Skin: Skin is warm and dry. No rash noted. She is not diaphoretic.  Psychiatric: She has a normal mood and affect.  Nursing note and vitals reviewed.   ED Course  Procedures (including critical care time)   MDM   Final diagnoses:  Dental caries    Symptoms are consistent with a tooth infection. There are no obvious signs of an abscess. SHe has no significant facial swelling. At this time there does not appear to be any evidence of an emergency medical condition. Pt has follow up to  have her tooth pulled.  Patient will be given prescriptions  for pain medications and antibiotics.      Linwood Dibbles, MD 04/10/15 [phone]

## 2015-04-10 NOTE — Discharge Instructions (Signed)
Dental Caries °Dental caries is tooth decay. This decay can cause a hole in teeth (cavity) that can get bigger and deeper over time. °HOME CARE °· Brush and floss your teeth. Do this at least two times a day. °· Use a fluoride toothpaste. °· Use a mouth rinse if told by your dentist or doctor. °· Eat less sugary and starchy foods. Drink less sugary drinks. °· Avoid snacking often on sugary and starchy foods. Avoid sipping often on sugary drinks. °· Keep regular checkups and cleanings with your dentist. °· Use fluoride supplements if told by your dentist or doctor. °· Allow fluoride to be applied to teeth if told by your dentist or doctor. °  °This information is not intended to replace advice given to you by your health care provider. Make sure you discuss any questions you have with your health care provider. °  °Document Released: 01/15/2008 Document Revised: 04/28/2014 Document Reviewed: 04/09/2012 °Elsevier Interactive Patient Education ©2016 [employer] ° °

## 2015-04-10 NOTE — ED Notes (Signed)
MD at bedside. 

## 2015-04-10 NOTE — ED Notes (Signed)
Pt was seen at [facility] on 03/25/15 for the same and given a nerve block, abx and motrin

## 2015-04-10 NOTE — ED Notes (Addendum)
Pt c/o dental pain r/t broken tooth and on upper left jaw with swelling and redness since yesterday.  States she saw the dentist today and they did not give her any medication, told her that she would need oral surgery though.

## 2017-11-08 ENCOUNTER — Encounter (HOSPITAL_COMMUNITY): Payer: Self-pay

## 2017-11-08 ENCOUNTER — Other Ambulatory Visit: Payer: Self-pay

## 2017-11-08 ENCOUNTER — Emergency Department (HOSPITAL_COMMUNITY)
Admission: EM | Admit: 2017-11-08 | Discharge: 2017-11-08 | Disposition: A | Discharge status: Home / Self Care | Payer: Self-pay | Attending: Emergency Medicine | Admitting: Emergency Medicine

## 2017-11-08 DIAGNOSIS — K029 Dental caries, unspecified: Secondary | ICD-10-CM | POA: Insufficient documentation

## 2017-11-08 DIAGNOSIS — Z720 Tobacco use: Secondary | ICD-10-CM

## 2017-11-08 DIAGNOSIS — K047 Periapical abscess without sinus: Secondary | ICD-10-CM

## 2017-11-08 DIAGNOSIS — Z79899 Other long term (current) drug therapy: Secondary | ICD-10-CM | POA: Insufficient documentation

## 2017-11-08 DIAGNOSIS — Z91040 Latex allergy status: Secondary | ICD-10-CM | POA: Insufficient documentation

## 2017-11-08 DIAGNOSIS — F17210 Nicotine dependence, cigarettes, uncomplicated: Secondary | ICD-10-CM | POA: Insufficient documentation

## 2017-11-08 MED ORDER — PENICILLIN V POTASSIUM 500 MG PO TABS: 1000.0000 mg | ORAL_TABLET | Freq: Two times a day (BID) | ORAL | 0 refills | Status: DC

## 2017-11-08 NOTE — ED Provider Notes (Signed)
[facility]-EMERGENCY DEPT Provider Note   CSN: [id number] Arrival date & time: 11/08/17  1153     History   Chief Complaint Chief Complaint  Patient presents with  . Dental Pain    HPI Melinda Padilla is a 27 y.o. female with a PMHx of heart murmur, who presents to the ED with complaints of left lower dental pain x 2 days.  She describes the pain as 10/10 constant aching nonradiating left lower dental pain that worsens with cold air exposure and has been unrelieved with Excedrin and ibuprofen.  She reports associated swelling to the gums and face on that side.  She does not have a dentist.  She admits to being a cigarette smoker.  She denies any gum drainage, drooling, trismus, fevers, chills, or any other complaints at this time.  The history is provided by the patient and medical records. No language interpreter was used.  Dental Pain      Past Medical History:  Diagnosis Date  . Heart murmur     There are no active problems to display for this patient.   History reviewed. No pertinent surgical history.   OB History   None      Home Medications    Prior to Admission medications   Medication Sig Start Date End Date Taking? Authorizing Provider  etodolac (LODINE) 500 MG tablet Take 1 tablet (500 mg total) by mouth 2 (two) times daily. 04/10/15   Linwood DibblesKnapp, Jon, MD  omeprazole (PRILOSEC) 20 MG capsule Take 1 capsule (20 mg total) by mouth daily. 01/28/15   Lyndal PulleyKnott, Daniel, MD  sucralfate (CARAFATE) 1 GM/10ML suspension Take 10 mLs (1 g total) by mouth 4 (four) times daily -  with meals and at bedtime. 01/28/15   Lyndal PulleyKnott, Daniel, MD    Family History No family history on file.  Social History Social History   Tobacco Use  . Smoking status: Current Every Day Smoker    Packs/day: 1.00    Types: Cigarettes  . Smokeless tobacco: Never Used  Substance Use Topics  . Alcohol use: No  . Drug use: Yes    Types: Marijuana     Allergies    Latex   Review of Systems Review of Systems  Constitutional: Negative for chills and fever.  HENT: Positive for dental problem and facial swelling. Negative for drooling and trouble swallowing.   Allergic/Immunologic: Negative for immunocompromised state.     Physical Exam Updated Vital Signs BP (!) 135/91 (BP Location: Left Arm)   Pulse 84   Temp 98.7 F (37.1 C) (Oral)   Resp 14   Ht 5\' 7"  (1.702 m)   Wt 95.3 kg (210 lb)   LMP 11/08/2017   SpO2 100%   BMI 32.89 kg/m   Physical Exam  Constitutional: She is oriented to person, place, and time. Vital signs are normal. She appears well-developed and well-nourished.  Non-toxic appearance. No distress.  Afebrile, nontoxic, NAD  HENT:  Head: Normocephalic and atraumatic.  Nose: Nose normal.  Mouth/Throat: Uvula is midline, oropharynx is clear and moist and mucous membranes are normal. No trismus in the jaw. Dental caries present. No dental abscesses or uvula swelling. Tonsils are 0 on the right. Tonsils are 0 on the left. No tonsillar exudate.  Nose clear.  Tooth #17 decayed with moderate TTP, with minimal surrounding gingival swelling and erythema, no definite abscess, no evidence of ludwig's. Multiple other decayed teeth, notably #15 Oropharynx clear and moist, without uvular swelling or deviation, no  trismus or drooling, no tonsillar swelling or erythema, no exudates.    No facial swelling appreciated.   Eyes: Conjunctivae and EOM are normal. Right eye exhibits no discharge. Left eye exhibits no discharge.  Neck: Normal range of motion. Neck supple.  Cardiovascular: Normal rate and intact distal pulses.  Pulmonary/Chest: Effort normal. No respiratory distress.  Abdominal: Normal appearance. She exhibits no distension.  Musculoskeletal: Normal range of motion.  Neurological: She is alert and oriented to person, place, and time. She has normal strength. No sensory deficit.  Skin: Skin is warm, dry and intact. No rash noted.   Psychiatric: She has a normal mood and affect. Her behavior is normal.  Nursing note and vitals reviewed.    ED Treatments / Results  Labs (all labs ordered are listed, but only abnormal results are displayed) Labs Reviewed - No data to display  EKG None  Radiology No results found.  Procedures Procedures (including critical care time)  Medications Ordered in ED Medications - No data to display   Initial Impression / Assessment and Plan / ED Course  I have reviewed the triage vital signs and the nursing notes.  Pertinent labs & imaging results that were available during my care of the patient were reviewed by me and considered in my medical decision making (see chart for details).     28 y.o. female here with Dental pain associated with dental decay and possible dental infection/early abscess, not yet visible along the gum line, not amendable to I&D today; patient afebrile, non toxic appearing and swallowing secretions well, no evidence of ludwig's. I gave patient referral to dentist and stressed the importance of dental follow up for ultimate management of dental pain.  I have also discussed reasons to return immediately to the ER.  Patient expresses understanding and agrees with plan.  I will also give PCN VK and discussed OTC remedies for pain control. Smoking cessation advised.    Final Clinical Impressions(s) / ED Diagnoses   Final diagnoses:  Pain due to dental caries  Infected dental caries  Tobacco user    ED Discharge Orders        Ordered    penicillin v potassium (VEETID) 500 MG tablet  2 times daily     11/08/17 [address] [place], [place] 11/08/17 1249    Bethann Berkshire, MD 11/08/17 1539

## 2017-11-08 NOTE — Discharge Instructions (Addendum)
Apply warm compresses to jaw throughout the day. Take antibiotic until finished. Alternate between tylenol and motrin as needed for pain. Perform salt water swishes to help with pain/swelling. Use over the counter oragel as needed for additional relief. STOP SMOKING! Followup with a dentist is very important for ongoing evaluation and management of recurrent dental pain, call the dentist listed above in the next 24-48 hours to schedule ongoing dental care, or use the list below to find a dentist in the next 24-48 hours for ongoing management of your dental issue. Return to emergency department for emergent changing or worsening symptoms.  °

## 2017-11-08 NOTE — ED Triage Notes (Addendum)
Pt states her face started swelling on Friday. Pt c/o that it's her wisdom teeth. States that she is unable to eat because of the pain. States tried to take excederin extra strength, but couldn't keep it down. Respirs easy, NAD, able to answer questions.

## 2018-07-03 ENCOUNTER — Encounter (HOSPITAL_BASED_OUTPATIENT_CLINIC_OR_DEPARTMENT_OTHER): Payer: Self-pay | Admitting: Emergency Medicine

## 2018-07-03 ENCOUNTER — Emergency Department (HOSPITAL_BASED_OUTPATIENT_CLINIC_OR_DEPARTMENT_OTHER)
Admission: EM | Admit: 2018-07-03 | Discharge: 2018-07-04 | Disposition: A | Discharge status: Home / Self Care | Payer: Self-pay | Attending: Emergency Medicine | Admitting: Emergency Medicine

## 2018-07-03 ENCOUNTER — Other Ambulatory Visit: Payer: Self-pay

## 2018-07-03 DIAGNOSIS — R202 Paresthesia of skin: Secondary | ICD-10-CM | POA: Insufficient documentation

## 2018-07-03 DIAGNOSIS — R112 Nausea with vomiting, unspecified: Secondary | ICD-10-CM | POA: Insufficient documentation

## 2018-07-03 DIAGNOSIS — Z79899 Other long term (current) drug therapy: Secondary | ICD-10-CM | POA: Insufficient documentation

## 2018-07-03 DIAGNOSIS — F17210 Nicotine dependence, cigarettes, uncomplicated: Secondary | ICD-10-CM | POA: Insufficient documentation

## 2018-07-03 DIAGNOSIS — R42 Dizziness and giddiness: Secondary | ICD-10-CM | POA: Insufficient documentation

## 2018-07-03 DIAGNOSIS — J00 Acute nasopharyngitis [common cold]: Secondary | ICD-10-CM | POA: Insufficient documentation

## 2018-07-03 NOTE — ED Notes (Signed)
Pt states she was at work and began to feel weak, began vomiting and feeling "tingly" in her hands. States tingling then moved down to her arms. Also reports dizziness. Pt is able to stand without assistance but states she feels that she cannot walk far.

## 2018-07-03 NOTE — ED Triage Notes (Signed)
Pt BIB [facility] for generalized weakness. Pt states she felt dizzy at work so sat down and tried drinking water. Vomited three times and weakness became progressively worse. She has been treated for hypokalemia before and feels similar

## 2018-07-04 LAB — CBC WITH DIFFERENTIAL/PLATELET
Abs Immature Granulocytes: 0.04 10*3/uL (ref 0.00–0.07)
BASOS PCT: 0 %
Basophils Absolute: 0 10*3/uL (ref 0.0–0.1)
EOS ABS: 0 10*3/uL (ref 0.0–0.5)
Eosinophils Relative: 0 %
HCT: 44.1 % (ref 36.0–46.0)
Hemoglobin: 14.2 g/dL (ref 12.0–15.0)
Immature Granulocytes: 1 %
Lymphocytes Relative: 7 %
Lymphs Abs: 0.6 10*3/uL — ABNORMAL LOW (ref 0.7–4.0)
MCH: 29.3 pg (ref 26.0–34.0)
MCHC: 32.2 g/dL (ref 30.0–36.0)
MCV: 90.9 fL (ref 80.0–100.0)
MONO ABS: 0.9 10*3/uL (ref 0.1–1.0)
Monocytes Relative: 11 %
Neutro Abs: 6.9 10*3/uL (ref 1.7–7.7)
Neutrophils Relative %: 81 %
Platelets: 196 10*3/uL (ref 150–400)
RBC: 4.85 MIL/uL (ref 3.87–5.11)
RDW: 12.1 % (ref 11.5–15.5)
WBC: 8.4 10*3/uL (ref 4.0–10.5)
nRBC: 0 % (ref 0.0–0.2)

## 2018-07-04 LAB — LIPASE, BLOOD: Lipase: 24 U/L (ref 11–51)

## 2018-07-04 LAB — URINALYSIS, ROUTINE W REFLEX MICROSCOPIC
Bilirubin Urine: NEGATIVE
Glucose, UA: NEGATIVE mg/dL
HGB URINE DIPSTICK: NEGATIVE
Ketones, ur: 40 mg/dL — AB
Leukocytes,Ua: NEGATIVE
NITRITE: NEGATIVE
PROTEIN: NEGATIVE mg/dL
Specific Gravity, Urine: 1.015 (ref 1.005–1.030)
pH: 8.5 — ABNORMAL HIGH (ref 5.0–8.0)

## 2018-07-04 LAB — PREGNANCY, URINE: PREG TEST UR: NEGATIVE

## 2018-07-04 LAB — COMPREHENSIVE METABOLIC PANEL
ALBUMIN: 4.1 g/dL (ref 3.5–5.0)
ALK PHOS: 41 U/L (ref 38–126)
ALT: 14 U/L (ref 0–44)
ANION GAP: 6 (ref 5–15)
AST: 25 U/L (ref 15–41)
BILIRUBIN TOTAL: 0.6 mg/dL (ref 0.3–1.2)
BUN: 11 mg/dL (ref 6–20)
CALCIUM: 8.9 mg/dL (ref 8.9–10.3)
CO2: 23 mmol/L (ref 22–32)
CREATININE: 0.83 mg/dL (ref 0.44–1.00)
Chloride: 103 mmol/L (ref 98–111)
GFR calc Af Amer: >60 mL/min (ref >60)
GFR calc non Af Amer: >60 mL/min (ref >60)
Glucose, Bld: 94 mg/dL (ref 70–99)
Potassium: 3.5 mmol/L (ref 3.5–5.1)
Sodium: 132 mmol/L — ABNORMAL LOW (ref 135–145)
TOTAL PROTEIN: 7.5 g/dL (ref 6.5–8.1)

## 2018-07-04 MED ORDER — SODIUM CHLORIDE 0.9 % IV BOLUS
1000.0000 mL | Freq: Once | INTRAVENOUS | Status: AC
  Administered 2018-07-04: 1000 mL via INTRAVENOUS

## 2018-07-04 NOTE — ED Notes (Signed)
ED Provider at bedside. 

## 2018-07-04 NOTE — ED Notes (Signed)
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 Scripts given at [employer]. NAD noted. All questions answered to satisfaction. Pt and mother escorted to check out window.

## 2018-07-04 NOTE — ED Provider Notes (Signed)
[facility] EMERGENCY DEPARTMENT Provider Note  CSN: [id number] Arrival date & time: 07/03/18 2257  Chief Complaint(s) Weakness and Emesis  HPI Melinda Padilla is a 28 y.o. female   The history is provided by the patient.  Dizziness  Quality:  Lightheadedness Severity:  Moderate Onset quality:  Sudden Duration:  1 day Timing:  Intermittent Progression:  Waxing and waning Chronicity:  Recurrent (similar to times when K+ was low) Context: head movement   Context: not with loss of consciousness   Relieved by:  Being still Worsened by:  Movement Associated symptoms: nausea and vomiting (NBNB)   Associated symptoms: no diarrhea    Recently seen 2 weeks ago for flu-like illness.   Past Medical History Past Medical History:  Diagnosis Date  . Heart murmur    There are no active problems to display for this patient.  Home Medication(s) Prior to Admission medications   Medication Sig Start Date End Date Taking? Authorizing Provider  etodolac (LODINE) 500 MG tablet Take 1 tablet (500 mg total) by mouth 2 (two) times daily. 04/10/15   Linwood Dibbles, MD  omeprazole (PRILOSEC) 20 MG capsule Take 1 capsule (20 mg total) by mouth daily. 01/28/15   Lyndal Pulley, MD  penicillin v potassium (VEETID) 500 MG tablet Take 2 tablets (1,000 mg total) by mouth 2 (two) times daily. X 7 days 11/08/17   Street, [place], PA-C  sucralfate (CARAFATE) 1 GM/10ML suspension Take 10 mLs (1 g total) by mouth 4 (four) times daily -  with meals and at bedtime. 01/28/15   Lyndal Pulley, MD                                                                                                                                    Past Surgical History History reviewed. No pertinent surgical history. Family History No family history on file.  Social History Social History   Tobacco Use  . Smoking status: Current Every Day Smoker    Packs/day: 1.00    Types: Cigarettes  . Smokeless tobacco: Never Used   Substance Use Topics  . Alcohol use: No  . Drug use: Yes    Types: Marijuana   Allergies Latex  Review of Systems Review of Systems  Gastrointestinal: Positive for nausea and vomiting (NBNB). Negative for diarrhea.  Neurological: Positive for dizziness.   All other systems are reviewed and are negative for acute change except as noted in the HPI   Physical Exam Vital Signs  I have reviewed the triage vital signs BP 123/82   Pulse 96   Temp 99.3 F (37.4 C) (Oral)   Resp 18   Ht 5\' 7"  (1.702 m)   Wt 97.5 kg   SpO2 100%   BMI 33.67 kg/m   Physical Exam Vitals signs reviewed.  Constitutional:      General: She is not in acute distress.    Appearance: She is well-developed. She is  not diaphoretic.  HENT:     Head: Normocephalic and atraumatic.     Nose: Mucosal edema and rhinorrhea present.     Mouth/Throat:     Pharynx: No oropharyngeal exudate.     Tonsils: No tonsillar exudate.     Comments: Post nasal drip Eyes:     General: No scleral icterus.       Right eye: No discharge.        Left eye: No discharge.     Conjunctiva/sclera: Conjunctivae normal.     Pupils: Pupils are equal, round, and reactive to light.  Neck:     Musculoskeletal: Normal range of motion and neck supple.  Cardiovascular:     Rate and Rhythm: Normal rate and regular rhythm.     Heart sounds: No murmur. No friction rub. No gallop.   Pulmonary:     Effort: Pulmonary effort is normal. No respiratory distress.     Breath sounds: Normal breath sounds. No stridor. No rales.  Abdominal:     General: There is no distension.     Palpations: Abdomen is soft.     Tenderness: There is no abdominal tenderness.  Musculoskeletal:        General: No tenderness.  Skin:    General: Skin is warm and dry.     Findings: No erythema or rash.  Neurological:     Mental Status: She is alert and oriented to person, place, and time.     ED Results and Treatments Labs (all labs ordered are listed, but  only abnormal results are displayed) Labs Reviewed  URINALYSIS, ROUTINE W REFLEX MICROSCOPIC - Abnormal; Notable for the following components:      Result Value   APPearance CLOUDY (*)    pH 8.5 (*)    Ketones, ur 40 (*)    All other components within normal limits  CBC WITH DIFFERENTIAL/PLATELET - Abnormal; Notable for the following components:   Lymphs Abs 0.6 (*)    All other components within normal limits  COMPREHENSIVE METABOLIC PANEL - Abnormal; Notable for the following components:   Sodium 132 (*)    All other components within normal limits  PREGNANCY, URINE  LIPASE, BLOOD                                                                                                                         EKG  EKG Interpretation  Date/Time:    Ventricular Rate:    PR Interval:    QRS Duration:   QT Interval:    QTC Calculation:   R Axis:     Text Interpretation:        Radiology No results found. Pertinent labs & imaging results that were available during my care of the patient were reviewed by me and considered in my medical decision making (see chart for details).  Medications Ordered in ED Medications  sodium chloride 0.9 % bolus 1,000 mL (0 mLs Intravenous Stopped 07/04/18 0129)  Procedures Procedures  (including critical care time)  Medical Decision Making / ED Course I have reviewed the nursing notes for this encounter and the patient's prior records (if available in EHR or on provided paperwork).    Patient presents with URI symptoms and lightheadedness with associated nausea and emesis.  She is afebrile with stable vital signs.  Well-appearing, well-hydrated and nontoxic.  Abdomen benign.  Labs reassuring without leukocytosis, anemia.  No significant electrolyte derangements or renal sufficiency.  No evidence of bili obstruction or  pancreatitis.  UPT negative.  Provided with IV fluids.  Lungs clear to auscultation bilaterally.  Likely viral process. Doubt PNA.  The patient appears reasonably screened and/or stabilized for discharge and I doubt any other medical condition or other [facility] requiring further screening, evaluation, or treatment in the ED at this time prior to discharge.  The patient is safe for discharge with strict return precautions.   Final Clinical Impression(s) / ED Diagnoses Final diagnoses:  Acute nasopharyngitis  Lightheadedness   Disposition: Discharge  Condition: Good  I have discussed the results, Dx and Tx plan with the patient who expressed understanding and agree(s) with the plan. Discharge instructions discussed at great length. The patient was given strict return precautions who verbalized understanding of the instructions. No further questions at time of discharge.    ED Discharge Orders    None       Follow Up: Primary care provider  Schedule an appointment as soon as possible for a visit  If you do not have a primary care physician, contact [facility] at [phone] for referral      This chart was dictated using voice recognition software.  Despite best efforts to proofread,  errors can occur which can change the documentation meaning.   Nira Conn, MD 07/04/18 [phone]

## 2018-07-04 NOTE — Discharge Instructions (Addendum)
You may take over-the-counter medicine for symptomatic relief, such as Tylenol, Motrin, TheraFlu, Alka seltzer , black elderberry, etc. Please limit acetaminophen (Tylenol) to 4000 mg and Ibuprofen (Motrin, Advil, etc.) to 2400 mg for a 24hr period. Please note that other over-the-counter medicine may contain acetaminophen or ibuprofen as a component of their ingredients.   

## 2018-07-08 ENCOUNTER — Other Ambulatory Visit: Payer: Self-pay

## 2018-07-08 ENCOUNTER — Emergency Department (HOSPITAL_COMMUNITY): Payer: Self-pay

## 2018-07-08 ENCOUNTER — Emergency Department (HOSPITAL_COMMUNITY)
Admission: EM | Admit: 2018-07-08 | Discharge: 2018-07-08 | Disposition: A | Discharge status: Home / Self Care | Payer: Self-pay | Attending: Emergency Medicine | Admitting: Emergency Medicine

## 2018-07-08 ENCOUNTER — Encounter (HOSPITAL_COMMUNITY): Payer: Self-pay

## 2018-07-08 DIAGNOSIS — F17210 Nicotine dependence, cigarettes, uncomplicated: Secondary | ICD-10-CM | POA: Insufficient documentation

## 2018-07-08 DIAGNOSIS — R69 Illness, unspecified: Secondary | ICD-10-CM

## 2018-07-08 DIAGNOSIS — Z79899 Other long term (current) drug therapy: Secondary | ICD-10-CM | POA: Insufficient documentation

## 2018-07-08 DIAGNOSIS — J111 Influenza due to unidentified influenza virus with other respiratory manifestations: Secondary | ICD-10-CM | POA: Insufficient documentation

## 2018-07-08 LAB — BASIC METABOLIC PANEL
Anion gap: 12 (ref 5–15)
BUN: 11 mg/dL (ref 6–20)
CO2: 24 mmol/L (ref 22–32)
CREATININE: 0.87 mg/dL (ref 0.44–1.00)
Calcium: 9 mg/dL (ref 8.9–10.3)
Chloride: 99 mmol/L (ref 98–111)
GFR calc Af Amer: >60 mL/min (ref >60)
GLUCOSE: 99 mg/dL (ref 70–99)
Potassium: 3 mmol/L — ABNORMAL LOW (ref 3.5–5.1)
Sodium: 135 mmol/L (ref 135–145)

## 2018-07-08 MED ORDER — ONDANSETRON HCL 4 MG/2ML IJ SOLN
4.0000 mg | Freq: Once | INTRAMUSCULAR | Status: AC
  Administered 2018-07-08: 4 mg via INTRAVENOUS
  Filled 2018-07-08: qty 2

## 2018-07-08 MED ORDER — GUAIFENESIN-DM 100-10 MG/5ML PO SYRP: 5.0000 mL | ORAL_SOLUTION | Freq: Three times a day (TID) | ORAL | 0 refills | Status: DC | PRN

## 2018-07-08 MED ORDER — KETOROLAC TROMETHAMINE 30 MG/ML IJ SOLN
15.0000 mg | Freq: Once | INTRAMUSCULAR | Status: AC
  Administered 2018-07-08: 15 mg via INTRAVENOUS
  Filled 2018-07-08: qty 1

## 2018-07-08 MED ORDER — SODIUM CHLORIDE 0.9 % IV BOLUS
1000.0000 mL | Freq: Once | INTRAVENOUS | Status: AC
  Administered 2018-07-08: 1000 mL via INTRAVENOUS

## 2018-07-08 MED ORDER — PROMETHAZINE HCL 25 MG PO TABS: 25.0000 mg | ORAL_TABLET | Freq: Four times a day (QID) | ORAL | 0 refills | Status: DC | PRN

## 2018-07-08 MED ORDER — ONDANSETRON 4 MG PO TBDP: 4.0000 mg | ORAL_TABLET | Freq: Three times a day (TID) | ORAL | 0 refills | Status: DC | PRN

## 2018-07-08 NOTE — ED Provider Notes (Signed)
[facility]-EMERGENCY DEPT Provider Note   CSN: [id number] Arrival date & time: 07/08/18  0855    History   Chief Complaint Chief Complaint  Patient presents with  . Cough    HPI Melinda Padilla is a 28 y.o. female.     HPI Patient presents with cough fatigue nausea and vomiting for the last week.  Seen in the ER 4 days ago and had negative work-up at that time but did not chest x-ray.  Continues to feel bad.  Still has nausea.  No sick contacts.  No fevers but has had some chills.  No production of the cough. Past Medical History:  Diagnosis Date  . Heart murmur     There are no active problems to display for this patient.   History reviewed. No pertinent surgical history.   OB History   No obstetric history on file.      Home Medications    Prior to Admission medications   Medication Sig Start Date End Date Taking? Authorizing Provider  etodolac (LODINE) 500 MG tablet Take 1 tablet (500 mg total) by mouth 2 (two) times daily. 04/10/15   Linwood Dibbles, MD  guaiFENesin-dextromethorphan (ROBITUSSIN DM) 100-10 MG/5ML syrup Take 5 mLs by mouth 3 (three) times daily as needed for cough. 07/08/18   Benjiman Core, MD  omeprazole (PRILOSEC) 20 MG capsule Take 1 capsule (20 mg total) by mouth daily. 01/28/15   Lyndal Pulley, MD  ondansetron (ZOFRAN-ODT) 4 MG disintegrating tablet Take 1 tablet (4 mg total) by mouth every 8 (eight) hours as needed for nausea or vomiting. 07/08/18   Benjiman Core, MD  penicillin v potassium (VEETID) 500 MG tablet Take 2 tablets (1,000 mg total) by mouth 2 (two) times daily. X 7 days 11/08/17   Street, [place], PA-C  promethazine (PHENERGAN) 25 MG tablet Take 1 tablet (25 mg total) by mouth every 6 (six) hours as needed for nausea. 07/08/18   Benjiman Core, MD  sucralfate (CARAFATE) 1 GM/10ML suspension Take 10 mLs (1 g total) by mouth 4 (four) times daily -  with meals and at bedtime. 01/28/15   Lyndal Pulley, MD     Family History History reviewed. No pertinent family history.  Social History Social History   Tobacco Use  . Smoking status: Current Every Day Smoker    Packs/day: 1.00    Types: Cigarettes  . Smokeless tobacco: Never Used  Substance Use Topics  . Alcohol use: No  . Drug use: Yes    Types: Marijuana     Allergies   Latex   Review of Systems Review of Systems  Constitutional: Positive for appetite change, chills and fatigue.  HENT: Negative for congestion.   Respiratory: Positive for cough and shortness of breath.   Cardiovascular: Positive for chest pain.  Gastrointestinal: Positive for nausea and vomiting.  Genitourinary: Negative for enuresis.  Musculoskeletal: Negative for back pain.  Skin: Negative for pallor.  Neurological: Positive for weakness.  Psychiatric/Behavioral: Negative for confusion.     Physical Exam Updated Vital Signs BP (!) 134/97 (BP Location: Left Arm)   Pulse 66   Temp 98 F (36.7 C) (Oral)   Resp 19   Ht 5\' 7"  (1.702 m)   Wt 97.5 kg   LMP 07/08/2018   SpO2 100%   BMI 33.67 kg/m   Physical Exam Vitals signs and nursing note reviewed.  Constitutional:      General: She is not in acute distress. HENT:     Head: Atraumatic.  Mouth/Throat:     Pharynx: No posterior oropharyngeal erythema.  Eyes:     Pupils: Pupils are equal, round, and reactive to light.  Cardiovascular:     Rate and Rhythm: Regular rhythm.  Pulmonary:     Comments: Mildly harsh breath sounds. Abdominal:     Tenderness: There is no abdominal tenderness.  Musculoskeletal:     Right lower leg: No edema.     Left lower leg: No edema.  Skin:    General: Skin is warm.     Capillary Refill: Capillary refill takes less than 2 seconds.  Neurological:     General: No focal deficit present.     Mental Status: She is alert.      ED Treatments / Results  Labs (all labs ordered are listed, but only abnormal results are displayed) Labs Reviewed  BASIC  METABOLIC PANEL - Abnormal; Notable for the following components:      Result Value   Potassium 3.0 (*)    All other components within normal limits    EKG None  Radiology Dg Chest 2 View  Result Date: 07/08/2018 CLINICAL DATA:  Nonproductive cough with fatigue EXAM: CHEST - 2 VIEW COMPARISON:  01/28/2015 FINDINGS: Normal heart size and mediastinal contours. No acute infiltrate or edema. No effusion or pneumothorax. No acute osseous findings. IMPRESSION: Negative chest. Electronically Signed   By: Marnee Spring M.D.   On: 07/08/2018 09:50    Procedures Procedures (including critical care time)  Medications Ordered in ED Medications  sodium chloride 0.9 % bolus 1,000 mL (0 mLs Intravenous Stopped 07/08/18 1207)  sodium chloride 0.9 % bolus 1,000 mL (0 mLs Intravenous Stopped 07/08/18 1354)  ondansetron (ZOFRAN) injection 4 mg (4 mg Intravenous Given 07/08/18 1210)  ketorolac (TORADOL) 30 MG/ML injection 15 mg (15 mg Intravenous Given 07/08/18 1209)     Initial Impression / Assessment and Plan / ED Course  I have reviewed the triage vital signs and the nursing notes.  Pertinent labs & imaging results that were available during my care of the patient were reviewed by me and considered in my medical decision making (see chart for details).        Patient with URI type symptoms.  Mild hypokalemia.  X-ray reassuring.  Feels mildly improved after treatment.  Think likely viral or potentially influenza.  Does not have other risk factors for covid 19 infection.  Will discharge home.  Symptomatic treatment.  Final Clinical Impressions(s) / ED Diagnoses   Final diagnoses:  Influenza-like illness    ED Discharge Orders         Ordered    ondansetron (ZOFRAN-ODT) 4 MG disintegrating tablet  Every 8 hours PRN     07/08/18 1345    promethazine (PHENERGAN) 25 MG tablet  Every 6 hours PRN     07/08/18 1345    guaiFENesin-dextromethorphan (ROBITUSSIN DM) 100-10 MG/5ML syrup  3 times  daily PRN,   Status:  Discontinued     07/08/18 1345    guaiFENesin-dextromethorphan (ROBITUSSIN DM) 100-10 MG/5ML syrup  3 times daily PRN     07/08/18 1346           Benjiman Core, MD 07/08/18 1512

## 2018-07-08 NOTE — ED Triage Notes (Signed)
Patient c/o a non productive cough, fatigue and chest hurts when taking a deep breath x 1 week.

## 2018-07-15 ENCOUNTER — Emergency Department (HOSPITAL_BASED_OUTPATIENT_CLINIC_OR_DEPARTMENT_OTHER)
Admission: EM | Admit: 2018-07-15 | Discharge: 2018-07-15 | Disposition: A | Discharge status: Home / Self Care | Payer: Self-pay | Attending: Emergency Medicine | Admitting: Emergency Medicine

## 2018-07-15 ENCOUNTER — Other Ambulatory Visit: Payer: Self-pay

## 2018-07-15 ENCOUNTER — Encounter (HOSPITAL_BASED_OUTPATIENT_CLINIC_OR_DEPARTMENT_OTHER): Payer: Self-pay | Admitting: Emergency Medicine

## 2018-07-15 DIAGNOSIS — J069 Acute upper respiratory infection, unspecified: Secondary | ICD-10-CM | POA: Insufficient documentation

## 2018-07-15 DIAGNOSIS — Z79899 Other long term (current) drug therapy: Secondary | ICD-10-CM | POA: Insufficient documentation

## 2018-07-15 DIAGNOSIS — B9789 Other viral agents as the cause of diseases classified elsewhere: Secondary | ICD-10-CM | POA: Insufficient documentation

## 2018-07-15 DIAGNOSIS — F17210 Nicotine dependence, cigarettes, uncomplicated: Secondary | ICD-10-CM | POA: Insufficient documentation

## 2018-07-15 MED ORDER — DEXAMETHASONE 6 MG PO TABS
10.0000 mg | ORAL_TABLET | Freq: Once | ORAL | Status: AC
  Administered 2018-07-15: 10 mg via ORAL
  Filled 2018-07-15: qty 1

## 2018-07-15 MED ORDER — AZITHROMYCIN 250 MG PO TABS: 250.0000 mg | ORAL_TABLET | Freq: Every day | ORAL | 0 refills | Status: DC

## 2018-07-15 MED ORDER — MECLIZINE HCL 25 MG PO TABS: 25.0000 mg | ORAL_TABLET | Freq: Three times a day (TID) | ORAL | 0 refills | Status: DC | PRN

## 2018-07-15 NOTE — Discharge Instructions (Signed)
Take tylenol 2 pills 4 times a day and motrin 4 pills 3 times a day.  Drink plenty of fluids.  Return for worsening shortness of breath, headache, confusion. Follow up with your family doctor.   

## 2018-07-15 NOTE — ED Triage Notes (Signed)
Pt c/o prod cough since around 3/10; also sts she feels dizzy at times

## 2018-07-15 NOTE — ED Provider Notes (Signed)
[facility] EMERGENCY DEPARTMENT Provider Note   CSN: [id number] Arrival date & time: 07/15/18  1257    History   Chief Complaint Chief Complaint  Patient presents with  . Cough    HPI Melinda Padilla is a 28 y.o. female.     28 yo F with a chief complaint of persistent cough.  This been going on for about 3 weeks now.  Started with cough congestion fever chills myalgias vomiting and diarrhea.  The other symptoms have resolved but she continues to have a cough.  She also feels dizzy which she has trouble describing.  Feels like a lightheaded sensation like she may pass out and that the world is spinning around her.  Worse with rapid head movement or sudden standing.  She denies ongoing issues with eating or drinking.  Denies shortness of breath.  She thinks she got this illness from her nephew.  The history is provided by the patient.  Cough  Associated symptoms: no chest pain, no chills, no fever, no headaches, no myalgias, no rhinorrhea, no shortness of breath and no wheezing   Illness  Severity:  Mild Onset quality:  Gradual Duration:  4 weeks Timing:  Constant Progression:  Unchanged Chronicity:  New Associated symptoms: congestion and cough   Associated symptoms: no chest pain, no fever, no headaches, no myalgias, no nausea, no rhinorrhea, no shortness of breath, no vomiting and no wheezing     Past Medical History:  Diagnosis Date  . Heart murmur     There are no active problems to display for this patient.   History reviewed. No pertinent surgical history.   OB History   No obstetric history on file.      Home Medications    Prior to Admission medications   Medication Sig Start Date End Date Taking? Authorizing Provider  azithromycin (ZITHROMAX) 250 MG tablet Take 1 tablet (250 mg total) by mouth daily. Take first 2 tablets together, then 1 every day until finished. 07/15/18   Melene Plan, DO  etodolac (LODINE) 500 MG tablet Take 1 tablet (500  mg total) by mouth 2 (two) times daily. 04/10/15   Linwood Dibbles, MD  guaiFENesin-dextromethorphan (ROBITUSSIN DM) 100-10 MG/5ML syrup Take 5 mLs by mouth 3 (three) times daily as needed for cough. 07/08/18   Benjiman Core, MD  meclizine (ANTIVERT) 25 MG tablet Take 1 tablet (25 mg total) by mouth 3 (three) times daily as needed for dizziness. 07/15/18   Melene Plan, DO  omeprazole (PRILOSEC) 20 MG capsule Take 1 capsule (20 mg total) by mouth daily. 01/28/15   Lyndal Pulley, MD  ondansetron (ZOFRAN-ODT) 4 MG disintegrating tablet Take 1 tablet (4 mg total) by mouth every 8 (eight) hours as needed for nausea or vomiting. 07/08/18   Benjiman Core, MD  penicillin v potassium (VEETID) 500 MG tablet Take 2 tablets (1,000 mg total) by mouth 2 (two) times daily. X 7 days 11/08/17   Street, [place], PA-C  promethazine (PHENERGAN) 25 MG tablet Take 1 tablet (25 mg total) by mouth every 6 (six) hours as needed for nausea. 07/08/18   Benjiman Core, MD  sucralfate (CARAFATE) 1 GM/10ML suspension Take 10 mLs (1 g total) by mouth 4 (four) times daily -  with meals and at bedtime. 01/28/15   Lyndal Pulley, MD    Family History No family history on file.  Social History Social History   Tobacco Use  . Smoking status: Current Every Day Smoker    Packs/day: 1.00  Types: Cigarettes  . Smokeless tobacco: Never Used  Substance Use Topics  . Alcohol use: No  . Drug use: Yes    Types: Marijuana     Allergies   Latex   Review of Systems Review of Systems  Constitutional: Negative for chills and fever.  HENT: Positive for congestion. Negative for rhinorrhea.   Eyes: Negative for redness and visual disturbance.  Respiratory: Positive for cough. Negative for shortness of breath and wheezing.   Cardiovascular: Negative for chest pain and palpitations.  Gastrointestinal: Negative for nausea and vomiting.  Genitourinary: Negative for dysuria and urgency.  Musculoskeletal: Negative for arthralgias and  myalgias.  Skin: Negative for pallor and wound.  Neurological: Positive for dizziness. Negative for headaches.     Physical Exam Updated Vital Signs BP 112/81 (BP Location: Right Arm)   Pulse 90   Temp 98.6 F (37 C) (Oral)   Resp 14   Ht  (1.702 m)   Wt 95.3 kg   LMP 07/08/2018   SpO2 97%   BMI 32.89 kg/m   Physical Exam Vitals signs and nursing note reviewed.  Constitutional:      General: She is not in acute distress.    Appearance: She is well-developed. She is not diaphoretic.  HENT:     Head: Normocephalic and atraumatic.     Comments: Swollen turbinates, posterior nasal drip, no noted sinus ttp, tm normal bilaterally.   Eyes:     Pupils: Pupils are equal, round, and reactive to light.  Neck:     Musculoskeletal: Normal range of motion and neck supple.  Cardiovascular:     Rate and Rhythm: Normal rate and regular rhythm.     Heart sounds: No murmur. No friction rub. No gallop.   Pulmonary:     Effort: Pulmonary effort is normal.     Breath sounds: No wheezing or rales.  Abdominal:     General: There is no distension.     Palpations: Abdomen is soft.     Tenderness: There is no abdominal tenderness.  Musculoskeletal:        General: No tenderness.  Skin:    General: Skin is warm and dry.  Neurological:     Mental Status: She is alert and oriented to person, place, and time.     GCS: GCS eye subscore is 4. GCS verbal subscore is 5. GCS motor subscore is 6.     Cranial Nerves: Cranial nerves are intact.     Sensory: Sensation is intact.     Motor: Motor function is intact.     Coordination: Coordination is intact. Romberg sign negative. Coordination normal. Finger-Nose-Finger Test and Heel to [facility] Test normal.     Gait: Gait is intact.     Comments: Ambulates without difficulty, no noted nystagmus.    Psychiatric:        Behavior: Behavior normal.      ED Treatments / Results  Labs (all labs ordered are listed, but only abnormal results are  displayed) Labs Reviewed - No data to display  EKG None  Radiology No results found.  Procedures Procedures (including critical care time) Discussed smoking cessation with patient and was they were offerred resources to help stop.  Total time was 5 min CPT code 16109.   Medications Ordered in ED Medications  dexamethasone (DECADRON) tablet 10 mg (has no administration in time range)     Initial Impression / Assessment and Plan / ED Course  I have reviewed the triage vital  signs and the nursing notes.  Pertinent labs & imaging results that were available during my care of the patient were reviewed by me and considered in my medical decision making (see chart for details).        28 yo F with a chief complaint of persistent cough after an upper respiratory illness.  She has been coughing for about 3 weeks to a month now.  This may be due to her being an active pack-a-day smoker.  I discussed this with her and talked about possibly quitting.  [place] pertuss[place] endemic [place] I will treat her presumptively with a Z-Pak.  She is feeling dizzy which she has trouble describing, she has a benign neurologic exam appears well-hydrated clinically.  I will have her push fluids at home for the next 48 hours and will trial a course of meclizine.  With his recent respiratory illness we will give a dose of Decadron to treat for possible labyrinthitis.  Discharge home.  1:28 PM:  I have discussed the diagnosis/risks/treatment options with the patient and believe the pt to be eligible for discharge home to follow-up with PCP. We also discussed returning to the ED immediately if new or worsening sx occur. We discussed the sx which are most concerning (e.g., sudden worsening pain, fever, inability to tolerate by mouth) that necessitate immediate return. Medications administered to the patient during their visit and any new prescriptions provided to the patient are listed below.  Medications  given during this visit Medications  dexamethasone (DECADRON) tablet 10 mg (has no administration in time range)     The patient appears reasonably screen and/or stabilized for discharge and I doubt any other medical condition or other [facility] requiring further screening, evaluation, or treatment in the ED at this time prior to discharge.    Final Clinical Impressions(s) / ED Diagnoses   Final diagnoses:  Viral URI with cough    ED Discharge Orders         Ordered    azithromycin (ZITHROMAX) 250 MG tablet  Daily     07/15/18 1324    meclizine (ANTIVERT) 25 MG tablet  3 times daily PRN     07/15/18 1324           Melene Plan, DO 07/15/18 1328

## 2019-04-22 ENCOUNTER — Other Ambulatory Visit: Payer: Self-pay

## 2019-04-22 ENCOUNTER — Encounter (HOSPITAL_COMMUNITY): Payer: Self-pay | Admitting: Emergency Medicine

## 2019-04-22 ENCOUNTER — Ambulatory Visit (HOSPITAL_COMMUNITY)
Admission: EM | Admit: 2019-04-22 | Discharge: 2019-04-22 | Disposition: A | Discharge status: Home / Self Care | Payer: Self-pay | Attending: Physician Assistant | Admitting: Physician Assistant

## 2019-04-22 DIAGNOSIS — K047 Periapical abscess without sinus: Secondary | ICD-10-CM

## 2019-04-22 MED ORDER — HYDROCODONE-ACETAMINOPHEN 5-325 MG PO TABS: 1.0000 | ORAL_TABLET | ORAL | 0 refills | Status: DC | PRN

## 2019-04-22 MED ORDER — AMOXICILLIN-POT CLAVULANATE 875-125 MG PO TABS: 1.0000 | ORAL_TABLET | Freq: Two times a day (BID) | ORAL | 0 refills | Status: AC

## 2019-04-22 NOTE — Discharge Instructions (Signed)
I have sent in an antibiotic, augmentin, take this as prescribed.  I have also sent in pain medications, please use 1-2 tablets for moderate to severe pain. For mild pain use ibuprofen or tylenol.   Avoid chewing hard foods on your right side. Rinse your mouth following any meals.  Please call to schedule a dental appoint as soon as possible.   If you are not improving at all or worsening over the next 48 hours please return to this clinic for re-evaluation.

## 2019-04-22 NOTE — ED Provider Notes (Addendum)
[facility]    CSN: [id number] Arrival date & time: 04/22/19  1453      History   Chief Complaint Chief Complaint  Patient presents with  . Dental Pain    HPI Melinda Padilla is a 29 y.o. female.   Patient reports to urgent care today for 2 days of worsening right upper dental pain. Patient reports 7/10 and worsening pain in her right upper teeth that is shooting into her right cheek area and in front of her right ear. She reports any touching or chewing on this right side causes the shooting pain. She is also reporting pain into her right lower jaw as well. She denies foul breath or noticing any discharge. She denies nasal drainage or recent upper respiratory symptoms. She has been taking over the counter medications and avoiding eating to help with the pain. She denies fever and chills. She is able to swallow liquids currently.  She reports a known history of issues with her dental health however has not been able to follow up due to cost. She reports broken and lost teeth on the right side of her mouth.      Past Medical History:  Diagnosis Date  . Heart murmur     There are no problems to display for this patient.   History reviewed. No pertinent surgical history.  OB History   No obstetric history on file.      Home Medications    Prior to Admission medications   Medication Sig Start Date End Date Taking? Authorizing Provider  amoxicillin-clavulanate (AUGMENTIN) 875-125 MG tablet Take 1 tablet by mouth every 12 (twelve) hours for 10 days. 04/22/19 05/02/19  Mackenna Kamer, Veryl Speak, PA-C  azithromycin (ZITHROMAX) 250 MG tablet Take 1 tablet (250 mg total) by mouth daily. Take first 2 tablets together, then 1 every day until finished. 07/15/18   Melene Plan, DO  etodolac (LODINE) 500 MG tablet Take 1 tablet (500 mg total) by mouth 2 (two) times daily. 04/10/15   Linwood Dibbles, MD  guaiFENesin-dextromethorphan (ROBITUSSIN DM) 100-10 MG/5ML syrup Take 5 mLs by mouth 3  (three) times daily as needed for cough. 07/08/18   Benjiman Core, MD  HYDROcodone-acetaminophen (NORCO/VICODIN) 5-325 MG tablet Take 1-2 tablets by mouth every 4 (four) hours as needed for moderate pain or severe pain. 04/22/19   Lewanna Petrak, Veryl Speak, PA-C  meclizine (ANTIVERT) 25 MG tablet Take 1 tablet (25 mg total) by mouth 3 (three) times daily as needed for dizziness. 07/15/18   Melene Plan, DO  omeprazole (PRILOSEC) 20 MG capsule Take 1 capsule (20 mg total) by mouth daily. 01/28/15   Lyndal Pulley, MD  ondansetron (ZOFRAN-ODT) 4 MG disintegrating tablet Take 1 tablet (4 mg total) by mouth every 8 (eight) hours as needed for nausea or vomiting. 07/08/18   Benjiman Core, MD  penicillin v potassium (VEETID) 500 MG tablet Take 2 tablets (1,000 mg total) by mouth 2 (two) times daily. X 7 days 11/08/17   Street, [place], PA-C  promethazine (PHENERGAN) 25 MG tablet Take 1 tablet (25 mg total) by mouth every 6 (six) hours as needed for nausea. 07/08/18   Benjiman Core, MD  sucralfate (CARAFATE) 1 GM/10ML suspension Take 10 mLs (1 g total) by mouth 4 (four) times daily -  with meals and at bedtime. 01/28/15   Lyndal Pulley, MD    Family History History reviewed. No pertinent family history.  Social History Social History   Tobacco Use  . Smoking status: Current Every Day  Smoker    Packs/day: 1.00    Types: Cigarettes  . Smokeless tobacco: Never Used  Substance Use Topics  . Alcohol use: No  . Drug use: Yes    Types: Marijuana     Allergies   Latex   Review of Systems Review of Systems  Constitutional: Negative for chills and fever.  HENT: Positive for dental problem, facial swelling and sinus pain. Negative for congestion, drooling, ear discharge, ear pain, mouth sores, sinus pressure, sore throat and trouble swallowing.   Respiratory: Negative for cough and shortness of breath.   Cardiovascular: Negative for chest pain.  Musculoskeletal: Negative for myalgias, neck pain and neck  stiffness.  Neurological: Negative for facial asymmetry and headaches.  Hematological: Negative.      Physical Exam Triage Vital Signs ED Triage Vitals  Enc Vitals Group     BP 04/22/19 1529 128/71     Pulse Rate 04/22/19 1529 63     Resp 04/22/19 1529 20     Temp 04/22/19 1529 98.4 F (36.9 C)     Temp Source 04/22/19 1529 Oral     SpO2 04/22/19 1529 100 %     Weight --      Height --      Head Circumference --      Peak Flow --      Pain Score 04/22/19 1527 2     Pain Loc --      Pain Edu? --      Excl. in GC? --    No data found.  Updated Vital Signs BP 128/71 (BP Location: Right Arm)   Pulse 63   Temp 98.4 F (36.9 C) (Oral)   Resp 20   LMP 04/17/2019   SpO2 100%   Visual Acuity Right Eye Distance:   Left Eye Distance:   Bilateral Distance:    Right Eye Near:   Left Eye Near:    Bilateral Near:     Physical Exam Vitals and nursing note reviewed.  Constitutional:      General: She is not in acute distress.    Appearance: She is well-developed.     Comments: Uncomfortable appearing  HENT:     Head: Normocephalic and atraumatic.     Comments: Mild right sided facial swelling. TTP over right side of face    Right Ear: External ear normal.     Left Ear: External ear normal.     Nose: Nose normal. No congestion or rhinorrhea.     Mouth/Throat:     Mouth: Mucous membranes are moist.     Dentition: Abnormal dentition. Dental tenderness, gingival swelling and dental caries present.     Tongue: No lesions. Tongue does not deviate from midline.     Palate: No mass and lesions.     Pharynx: Oropharynx is clear. No pharyngeal swelling, oropharyngeal exudate or posterior oropharyngeal erythema.      Comments: Poor overall dentition with multiple missing teeth.  Eyes:     General: No scleral icterus.       Right eye: No discharge.        Left eye: No discharge.     Extraocular Movements: Extraocular movements intact.     Conjunctiva/sclera: Conjunctivae  normal.     Pupils: Pupils are equal, round, and reactive to light.  Cardiovascular:     Rate and Rhythm: Normal rate and regular rhythm.  Pulmonary:     Effort: Pulmonary effort is normal. No respiratory distress.  Musculoskeletal:  General: Normal range of motion.     Cervical back: Neck supple.     Right lower leg: No edema.     Left lower leg: No edema.  Skin:    General: Skin is warm and dry.     Capillary Refill: Capillary refill takes less than 2 seconds.  Neurological:     General: No focal deficit present.     Mental Status: She is alert and oriented to person, place, and time.  Psychiatric:        Mood and Affect: Mood normal.        Behavior: Behavior normal.        Thought Content: Thought content normal.        Judgment: Judgment normal.      UC Treatments / Results  Labs (all labs ordered are listed, but only abnormal results are displayed) Labs Reviewed - No data to display  EKG   Radiology No results found.  Procedures Procedures (including critical care time)  Medications Ordered in UC Medications - No data to display  Initial Impression / Assessment and Plan / UC Course  I have reviewed the triage vital signs and the nursing notes.  Pertinent labs & imaging results that were available during my care of the patient were reviewed by me and considered in my medical decision making (see chart for details).     #Dental infection - Poor dentition. Exam and history consistent with an acute dental infection. No current systemic involvement, however stressed the importance of evaluation in the next 2-3 days by a dentist to patient and resources given. Strict return precautions given. Sent augmentin for infection and norco for pain management until she can be evaluated.    Final Clinical Impressions(s) / UC Diagnoses   Final diagnoses:  Dental infection     Discharge Instructions     I have sent in an antibiotic, augmentin, take this as  prescribed.  I have also sent in pain medications, please use 1-2 tablets for moderate to severe pain. For mild pain use ibuprofen or tylenol.   Avoid chewing hard foods on your right side. Rinse your mouth following any meals.  Please call to schedule a dental appoint as soon as possible.   If you are not improving at all or worsening over the next 48 hours please return to this clinic for re-evaluation.       ED Prescriptions    Medication Sig Dispense Auth. Provider   amoxicillin-clavulanate (AUGMENTIN) 875-125 MG tablet Take 1 tablet by mouth every 12 (twelve) hours for 10 days. 14 tablet Gryffin Altice, Marguerita Beards, PA-C   HYDROcodone-acetaminophen (NORCO/VICODIN) 5-325 MG tablet Take 1-2 tablets by mouth every 4 (four) hours as needed for moderate pain or severe pain. 10 tablet Jeanita Carneiro, Marguerita Beards, PA-C     I have reviewed the PDMP during this encounter.   Purnell Shoemaker, PA-C 04/22/19 1621    Alfrieda Tarry, Marguerita Beards, PA-C 04/22/19 1622

## 2019-04-22 NOTE — ED Triage Notes (Signed)
Pt here for right upper dental pain onset last night  Taking Aleve w/no relief  Denies fevers  A&O x4... NAD.Marland Kitchen. ambulatory

## 2019-09-22 ENCOUNTER — Ambulatory Visit (HOSPITAL_COMMUNITY)
Admission: EM | Admit: 2019-09-22 | Discharge: 2019-09-22 | Disposition: A | Discharge status: Home / Self Care | Payer: Medicaid Other | Attending: Family Medicine | Admitting: Family Medicine

## 2019-09-22 ENCOUNTER — Encounter (HOSPITAL_COMMUNITY): Payer: Self-pay

## 2019-09-22 ENCOUNTER — Other Ambulatory Visit: Payer: Self-pay

## 2019-09-22 DIAGNOSIS — U071 COVID-19: Secondary | ICD-10-CM | POA: Insufficient documentation

## 2019-09-22 DIAGNOSIS — R5383 Other fatigue: Secondary | ICD-10-CM | POA: Diagnosis present

## 2019-09-22 DIAGNOSIS — J029 Acute pharyngitis, unspecified: Secondary | ICD-10-CM | POA: Diagnosis present

## 2019-09-22 DIAGNOSIS — Z20822 Contact with and (suspected) exposure to covid-19: Secondary | ICD-10-CM | POA: Diagnosis present

## 2019-09-22 DIAGNOSIS — R0981 Nasal congestion: Secondary | ICD-10-CM | POA: Diagnosis present

## 2019-09-22 DIAGNOSIS — Z1152 Encounter for screening for COVID-19: Secondary | ICD-10-CM

## 2019-09-22 LAB — SARS CORONAVIRUS 2 (TAT 6-24 HRS): SARS Coronavirus 2: POSITIVE — AB

## 2019-09-22 NOTE — Discharge Instructions (Signed)
Your COVID test is pending.  You should self quarantine until the test result is back.    Take Tylenol as needed for fever or discomfort.  Rest and keep yourself hydrated.    Go to the emergency department if you develop shortness of breath, severe diarrhea, high fever not relieved by Tylenol or ibuprofen, or other concerning symptoms.    

## 2019-09-22 NOTE — ED Triage Notes (Signed)
Pt c/o sore throat and difficulty swallowing, HA, congestion, fatigue for approx 6 days; also reports subjective fever.  Denies abdom pain, n/v/d, dysuria, cough, SOB.  Last dose OTC ibuprofen 2 days ago.

## 2019-09-22 NOTE — ED Provider Notes (Signed)
[place]   [id number] 09/22/19 Arrival Time: 4287   CC: COVID symptoms  SUBJECTIVE: History from: patient.  Melinda Padilla is a 29 y.o. female who presents with abrupt onset of nasal congestion, PND, sore throat, and persistent dry cough for the last 5 days. Reports 2 positive Covid cases at work with a direct contact exposure. Has not had Covid vaccines. Denies recent travel. Has taken zyrtec and ibuprofen with little relief. There are no aggravating symptoms. Denies previous symptoms in the past. Denies fever, chills, sinus pain, rhinorrhea, SOB, wheezing, chest pain, nausea, changes in bowel or bladder habits.    ROS: As per HPI.  All other pertinent ROS negative.     Past Medical History:  Diagnosis Date  . Heart murmur    History reviewed. No pertinent surgical history. Allergies  Allergen Reactions  . Latex Hives   No current facility-administered medications on file prior to encounter.   Current Outpatient Medications on File Prior to Encounter  Medication Sig Dispense Refill  . azithromycin (ZITHROMAX) 250 MG tablet Take 1 tablet (250 mg total) by mouth daily. Take first 2 tablets together, then 1 every day until finished. 6 tablet 0  . etodolac (LODINE) 500 MG tablet Take 1 tablet (500 mg total) by mouth 2 (two) times daily. 20 tablet 0  . guaiFENesin-dextromethorphan (ROBITUSSIN DM) 100-10 MG/5ML syrup Take 5 mLs by mouth 3 (three) times daily as needed for cough. 118 mL 0  . HYDROcodone-acetaminophen (NORCO/VICODIN) 5-325 MG tablet Take 1-2 tablets by mouth every 4 (four) hours as needed for moderate pain or severe pain. 10 tablet 0  . meclizine (ANTIVERT) 25 MG tablet Take 1 tablet (25 mg total) by mouth 3 (three) times daily as needed for dizziness. 30 tablet 0  . omeprazole (PRILOSEC) 20 MG capsule Take 1 capsule (20 mg total) by mouth daily. 30 capsule 0  . ondansetron (ZOFRAN-ODT) 4 MG disintegrating tablet Take 1 tablet (4 mg total) by mouth every 8  (eight) hours as needed for nausea or vomiting. 8 tablet 0  . penicillin v potassium (VEETID) 500 MG tablet Take 2 tablets (1,000 mg total) by mouth 2 (two) times daily. X 7 days 28 tablet 0  . promethazine (PHENERGAN) 25 MG tablet Take 1 tablet (25 mg total) by mouth every 6 (six) hours as needed for nausea. 10 tablet 0  . sucralfate (CARAFATE) 1 GM/10ML suspension Take 10 mLs (1 g total) by mouth 4 (four) times daily -  with meals and at bedtime. 420 mL 0   Social History   Socioeconomic History  . Marital status: Single    Spouse name: Not on file  . Number of children: Not on file  . Years of education: Not on file  . Highest education level: Not on file  Occupational History  . Not on file  Tobacco Use  . Smoking status: Current Every Day Smoker    Packs/day: 1.00    Types: Cigarettes  . Smokeless tobacco: Never Used  Substance and Sexual Activity  . Alcohol use: No  . Drug use: Yes    Types: Marijuana  . Sexual activity: Never    Birth control/protection: None  Other Topics Concern  . Not on file  Social History Narrative  . Not on file   Social Determinants of Health   Financial Resource Strain:   . Difficulty of Paying Living Expenses:   Food Insecurity:   . Worried About Charity fundraiser in the Last Year:   .  Ran Out of Food in the Last Year:   Transportation Needs:   . Freight forwarder (Medical):   Marland Kitchen Lack of Transportation (Non-Medical):   Physical Activity:   . Days of Exercise per Week:   . Minutes of Exercise per Session:   Stress:   . Feeling of Stress :   Social Connections:   . Frequency of Communication with Friends and Family:   . Frequency of Social Gatherings with Friends and Family:   . Attends Religious Services:   . Active Member of Clubs or Organizations:   . Attends Banker Meetings:   Marland Kitchen Marital Status:   Intimate Partner Violence:   . Fear of Current or Ex-Partner:   . Emotionally Abused:   Marland Kitchen Physically Abused:     . Sexually Abused:    History reviewed. No pertinent family history.  OBJECTIVE:  Vitals:   09/22/19 0856  BP: 106/76  Pulse: 80  Resp: 17  Temp: 98.2 F (36.8 C)  TempSrc: Oral  SpO2: 100%     General appearance: alert; appears fatigued, but nontoxic; speaking in full sentences and tolerating own secretions HEENT: NCAT; Ears: EACs clear, TMs pearly gray; Eyes: PERRL.  EOM grossly intact. Sinuses: nontender; Nose: nares patent without rhinorrhea, Throat: oropharynx clear, tonsils non erythematous or enlarged, uvula midline  Neck: supple without LAD Lungs: unlabored respirations, symmetrical air entry; cough: mild; no respiratory distress; CTAB Heart: regular rate and rhythm.  Radial pulses 2+ symmetrical bilaterally Skin: warm and dry Psychological: alert and cooperative; normal mood and affect  LABS:  No results found for this or any previous visit (from the past 24 hour(s)).   ASSESSMENT & PLAN:  1. Pharyngitis, unspecified etiology   2. Nasal congestion   3. Other fatigue   4. Contact with and (suspected) exposure to covid-19   5. Encounter for screening for COVID-19     No orders of the defined types were placed in this encounter.   Pharyngitis Nasal Congestion Fatigue Covid exposure Covid screen COVID testing ordered.  It will take between 1-2 days for test results.  Someone will contact you regarding abnormal results.    Patient should remain in quarantine until they have received Covid results.  If negative you may resume normal activities (go back to work/school) while practicing hand hygiene, social distance, and mask wearing.  If positive, patient should remain in quarantine for 10 days from symptom onset AND greater than 72 hours after symptoms resolution (absence of fever without the use of fever-reducing medication and improvement in respiratory symptoms), whichever is longer Get plenty of rest and push fluids Use OTC zyrtec for nasal congestion, runny  nose, and/or sore throat Use OTC flonase for nasal congestion and runny nose Use medications daily for symptom relief Use OTC medications like ibuprofen or tylenol as needed fever or pain Call or go to the ED if you have any new or worsening symptoms such as fever, worsening cough, shortness of breath, chest tightness, chest pain, turning blue, changes in mental status.  Reviewed expectations re: course of current medical issues. Questions answered. Outlined signs and symptoms indicating need for more acute intervention. Patient verbalized understanding. After Visit Summary given.         Moshe Cipro, NP 09/22/19 0930

## 2019-09-23 ENCOUNTER — Telehealth: Payer: Self-pay | Admitting: Physician Assistant

## 2019-09-23 NOTE — Telephone Encounter (Signed)
Called to discuss with Melinda Padilla about Covid symptoms and the use of bamlanivimab/etesevimab or casirivimab/imdevimab, a monoclonal antibody infusion for those with mild to moderate Covid symptoms and at a high risk of hospitalization.     Pt is qualified for this infusion at the [facility] infusion center due to co-morbid conditions and/or a member of an at-risk group (BMI >25), however she wants to think more about the infusion at this time. Symptoms tier reviewed as well as criteria for ending isolation.  Symptoms reviewed that would warrant ED/Hospital evaluation. Preventative practices reviewed. Patient verbalized understanding. Patient advised to call back if he decides that he does want to get infusion. Callback number to the infusion center given. Patient advised to go to Urgent care or ED with severe symptoms. Last date pt would be eligible for infusion is 6/8/1. She has our hotline number if she decides she wants the infusion.    Cline Crock PA-C

## 2020-10-22 ENCOUNTER — Emergency Department (HOSPITAL_COMMUNITY)
Admission: EM | Admit: 2020-10-22 | Discharge: 2020-10-23 | Disposition: A | Discharge status: Home / Self Care | Payer: Self-pay | Attending: Emergency Medicine | Admitting: Emergency Medicine

## 2020-10-22 ENCOUNTER — Other Ambulatory Visit: Payer: Self-pay

## 2020-10-22 ENCOUNTER — Emergency Department (HOSPITAL_COMMUNITY): Payer: Self-pay

## 2020-10-22 ENCOUNTER — Encounter (HOSPITAL_COMMUNITY): Payer: Self-pay | Admitting: Emergency Medicine

## 2020-10-22 DIAGNOSIS — W19XXXA Unspecified fall, initial encounter: Secondary | ICD-10-CM | POA: Insufficient documentation

## 2020-10-22 DIAGNOSIS — S82831A Other fracture of upper and lower end of right fibula, initial encounter for closed fracture: Secondary | ICD-10-CM | POA: Insufficient documentation

## 2020-10-22 DIAGNOSIS — F17210 Nicotine dependence, cigarettes, uncomplicated: Secondary | ICD-10-CM | POA: Insufficient documentation

## 2020-10-22 DIAGNOSIS — Z91040 Latex allergy status: Secondary | ICD-10-CM | POA: Insufficient documentation

## 2020-10-22 MED ORDER — OXYCODONE-ACETAMINOPHEN 5-325 MG PO TABS: 1.0000 | ORAL_TABLET | Freq: Three times a day (TID) | ORAL | 0 refills | Status: DC | PRN

## 2020-10-22 MED ORDER — OXYCODONE-ACETAMINOPHEN 5-325 MG PO TABS
1.0000 | ORAL_TABLET | Freq: Once | ORAL | Status: AC
  Administered 2020-10-22: 1 via ORAL
  Filled 2020-10-22: qty 1

## 2020-10-22 MED ORDER — KETOROLAC TROMETHAMINE 30 MG/ML IJ SOLN
60.0000 mg | Freq: Once | INTRAMUSCULAR | Status: AC
  Administered 2020-10-22: 60 mg via INTRAMUSCULAR
  Filled 2020-10-22: qty 2

## 2020-10-22 NOTE — Discharge Instructions (Addendum)
Do not bear weight on the leg and use the crutches. Follow-up with Dr. Luiz Blare, the orthopedist that I spoke to today regarding her fracture. Return to the ER if you start to experience worsening pain, additional injuries

## 2020-10-22 NOTE — ED Provider Notes (Signed)
[facility]-EMERGENCY DEPT Provider Note   CSN: [id number] Arrival date & time: 10/22/20  1918     History Chief Complaint  Patient presents with   Ankle Injury    Melinda Padilla is a 30 y.o. female.   Ankle Injury This is a new problem. The current episode started 1 to 2 hours ago. The problem has not changed since onset.The symptoms are aggravated by walking. Nothing relieves the symptoms. She has tried nothing for the symptoms.      Past Medical History:  Diagnosis Date   Heart murmur     There are no problems to display for this patient.   History reviewed. No pertinent surgical history.   OB History   No obstetric history on file.     No family history on file.  Social History   Tobacco Use   Smoking status: Every Day    Packs/day: 1.00    Pack years: 0.00    Types: Cigarettes   Smokeless tobacco: Never  Vaping Use   Vaping Use: Never used  Substance Use Topics   Alcohol use: No   Drug use: Yes    Types: Marijuana    Home Medications Prior to Admission medications   Medication Sig Start Date End Date Taking? Authorizing Provider  oxyCODONE-acetaminophen (PERCOCET/ROXICET) 5-325 MG tablet Take 1 tablet by mouth every 8 (eight) hours as needed for severe pain. 10/22/20  Yes Myran Arcia, PA-C  azithromycin (ZITHROMAX) 250 MG tablet Take 1 tablet (250 mg total) by mouth daily. Take first 2 tablets together, then 1 every day until finished. 07/15/18   Melene Plan, DO  etodolac (LODINE) 500 MG tablet Take 1 tablet (500 mg total) by mouth 2 (two) times daily. 04/10/15   Linwood Dibbles, MD  guaiFENesin-dextromethorphan (ROBITUSSIN DM) 100-10 MG/5ML syrup Take 5 mLs by mouth 3 (three) times daily as needed for cough. 07/08/18   Benjiman Core, MD  HYDROcodone-acetaminophen (NORCO/VICODIN) 5-325 MG tablet Take 1-2 tablets by mouth every 4 (four) hours as needed for moderate pain or severe pain. 04/22/19   Darr, Gerilyn Pilgrim, PA-C  meclizine (ANTIVERT)  25 MG tablet Take 1 tablet (25 mg total) by mouth 3 (three) times daily as needed for dizziness. 07/15/18   Melene Plan, DO  omeprazole (PRILOSEC) 20 MG capsule Take 1 capsule (20 mg total) by mouth daily. 01/28/15   Lyndal Pulley, MD  ondansetron (ZOFRAN-ODT) 4 MG disintegrating tablet Take 1 tablet (4 mg total) by mouth every 8 (eight) hours as needed for nausea or vomiting. 07/08/18   Benjiman Core, MD  penicillin v potassium (VEETID) 500 MG tablet Take 2 tablets (1,000 mg total) by mouth 2 (two) times daily. X 7 days 11/08/17   Street, Hazen, PA-C  promethazine (PHENERGAN) 25 MG tablet Take 1 tablet (25 mg total) by mouth every 6 (six) hours as needed for nausea. 07/08/18   Benjiman Core, MD  sucralfate (CARAFATE) 1 GM/10ML suspension Take 10 mLs (1 g total) by mouth 4 (four) times daily -  with meals and at bedtime. 01/28/15   Lyndal Pulley, MD    Allergies    Latex  Review of Systems   Review of Systems  Constitutional:  Negative for chills and fever.  Musculoskeletal:  Positive for arthralgias and joint swelling.  Neurological:  Negative for numbness.   Physical Exam Updated Vital Signs BP (!) 134/93 (BP Location: Right Arm)   Pulse 76   Temp 98.7 F (37.1 C) (Oral)   Resp (!) 22  LMP 10/22/2020 (Exact Date)   SpO2 100%   Physical Exam Vitals and nursing note reviewed.  Constitutional:      General: She is not in acute distress.    Appearance: She is well-developed. She is not diaphoretic.  HENT:     Head: Normocephalic and atraumatic.  Eyes:     General: No scleral icterus.    Conjunctiva/sclera: Conjunctivae normal.  Pulmonary:     Effort: Pulmonary effort is normal. No respiratory distress.  Musculoskeletal:        General: Swelling and tenderness present.     Cervical back: Normal range of motion.     Comments: Diffuse edema noted of the right ankle with tenderness palpation of the lateral aspect.  2+ DP pulse palpated.  Pain with active range of motion of  the ankle.  Normal sensation to light touch.  Moving digits.  No overlying skin changes.  Skin:    Findings: No rash.  Neurological:     Mental Status: She is alert.    ED Results / Procedures / Treatments   Labs (all labs ordered are listed, but only abnormal results are displayed) Labs Reviewed - No data to display  EKG None  Radiology DG Ankle Complete Right  Result Date: 10/22/2020 CLINICAL DATA:  Status post fall swelling.  Unable to bear weight. EXAM: RIGHT ANKLE - COMPLETE 3+ VIEW COMPARISON:  None. FINDINGS: Transsyndesmotic distal fibular laterally and posteriorly displaced comminuted fracture. Associated widening of the medial clear space. No definite posterior malleolar fracture. Ankle effusion. Associated subcutaneus soft tissue edema. IMPRESSION: 1. Acute displaced, comminuted transsyndesmotic distal fibular fracture. 2. Widening of the medial clear space suggestive of medial collateral ligament disruption. Electronically Signed   By: Tish Frederickson M.D.   On: 10/22/2020 20:10    Procedures Procedures   Medications Ordered in ED Medications  oxyCODONE-acetaminophen (PERCOCET/ROXICET) 5-325 MG per tablet 1 tablet (1 tablet Oral Given 10/22/20 2033)    ED Course  I have reviewed the triage vital signs and the nursing notes.  Pertinent labs & imaging results that were available during my care of the patient were reviewed by me and considered in my medical decision making (see chart for details).  Clinical Course as of 10/22/20 2146  Mon Oct 22, 2020  2050 Compressive splint with padding and kerlex. Posterior splint, compressor dressing first, then splint. Crutches, NWB, follow up Dr. Luiz Blare tomorrow [HK]    Clinical Course User Index [HK] Dietrich Pates, PA-C   MDM Rules/Calculators/A&P                          30 year old female presenting to the ED for right ankle pain and swelling after accidentally twisting it prior to arrival while doing squats.  There is edema  and tenderness noted of the right lateral ankle on physical exam.  Areas are vastly intact.  There is not an open fracture.  X-ray shows a comminuted trances does myotic distal fibular fracture and possible medial collateral ligament disruption.  Per Dr. Luiz Blare recommendations will place in a compressive posterior splint, advised nonweightbearing and give crutches.  Advised to follow-up with Dr. Luiz Blare tomorrow.  Patient is agreeable to the plan.  Return precautions given.  All imaging, if done today, including plain films, CT scans, and ultrasounds, independently reviewed by me, and interpretations confirmed via formal radiology reads.  Patient is hemodynamically stable, in NAD. Evaluation does not show pathology that would require ongoing emergent intervention or inpatient  treatment. I explained the diagnosis to the patient. Pain has been managed and has no complaints prior to discharge. Patient is comfortable with above plan and is stable for discharge at this time. All questions were answered prior to disposition. Strict return precautions for returning to the ED were discussed. Encouraged follow up with PCP.   Prior to providing a prescription for a controlled substance, I independently reviewed the patient's recent prescription history on the [place] Controlled Substance Reporting System. The patient had no recent or regular prescriptions and was deemed appropriate for a brief, less than 3 day prescription of narcotic for acute analgesia.  An After Visit Summary was printed and given to the patient.   Portions of this note were generated with Scientist, clinical (histocompatibility and immunogenetics). Dictation errors may occur despite best attempts at proofreading.  Final Clinical Impression(s) / ED Diagnoses Final diagnoses:  Other closed fracture of distal end of right fibula, initial encounter    Rx / DC Orders ED Discharge Orders          Ordered    oxyCODONE-acetaminophen (PERCOCET/ROXICET) 5-325 MG tablet   Every 8 hours PRN        10/22/20 2141             Dietrich Pates, PA-C 10/22/20 2146    Terrilee Files, MD 10/23/20 1145

## 2020-10-22 NOTE — ED Notes (Signed)
Pt's friend [place] would like a call when the ortho tech is here. Pt's number is in the chart.

## 2020-10-22 NOTE — ED Triage Notes (Signed)
Patient arrives complaining of an ankle injury. Patient was in a squatting position, when she lost her balance and her ankle "snapped" underneath her. No tib/fib pain, no foot pain, swelling localized to ankle. Patient unable to bear weight.

## 2020-10-23 NOTE — Progress Notes (Signed)
Orthopedic Tech Progress Note Patient Details:  Melinda Padilla [birth year] [id number]  Ortho Devices Type of Ortho Device: Post (short leg) splint Ortho Device/Splint Location: rle. I applied a well padded splint with kerlix applied on top of the padding as requested. Ortho Device/Splint Interventions: Ordered, Application, Adjustment   Post Interventions Patient Tolerated: Well Instructions Provided: Care of device, Adjustment of device  [name] Post 10/23/2020, 12:59 AM

## 2020-10-23 NOTE — ED Provider Notes (Signed)
Patient received at signout pending compressive posterior splint placement.  Blood placed by [facility].  Patient has been given crutches.  Pain is well controlled at time of discharge.  Please see previous provider's note for outpatient care plan.   Frederik Pear A, PA-C 10/23/20 0041    Gilda Crease, MD 10/23/20 [phone]

## 2020-10-29 ENCOUNTER — Other Ambulatory Visit: Payer: Self-pay

## 2020-10-29 ENCOUNTER — Emergency Department (HOSPITAL_COMMUNITY): Payer: Self-pay

## 2020-10-29 ENCOUNTER — Encounter (HOSPITAL_COMMUNITY): Payer: Self-pay

## 2020-10-29 ENCOUNTER — Emergency Department (HOSPITAL_COMMUNITY)
Admission: EM | Admit: 2020-10-29 | Discharge: 2020-10-30 | Disposition: A | Discharge status: Home / Self Care | Payer: Self-pay | Attending: Emergency Medicine | Admitting: Emergency Medicine

## 2020-10-29 DIAGNOSIS — Z4789 Encounter for other orthopedic aftercare: Secondary | ICD-10-CM

## 2020-10-29 DIAGNOSIS — Z91040 Latex allergy status: Secondary | ICD-10-CM | POA: Insufficient documentation

## 2020-10-29 DIAGNOSIS — F17210 Nicotine dependence, cigarettes, uncomplicated: Secondary | ICD-10-CM | POA: Insufficient documentation

## 2020-10-29 DIAGNOSIS — Z4689 Encounter for fitting and adjustment of other specified devices: Secondary | ICD-10-CM | POA: Insufficient documentation

## 2020-10-29 DIAGNOSIS — R202 Paresthesia of skin: Secondary | ICD-10-CM | POA: Insufficient documentation

## 2020-10-29 MED ORDER — OXYCODONE HCL 5 MG PO TABS
5.0000 mg | ORAL_TABLET | Freq: Once | ORAL | Status: AC
  Administered 2020-10-29: 5 mg via ORAL
  Filled 2020-10-29: qty 1

## 2020-10-29 NOTE — ED Triage Notes (Signed)
+  Tib/fib fracture 7/4. Today patient started having pin/needle sensation to right lower extremity. Patient is crying on arrival. Pt denies fall/trauma.

## 2020-10-29 NOTE — Progress Notes (Addendum)
Orthopedic Tech Progress Note Patient Details:  Melinda Padilla 08-May-1990 [id number]     Post Interventions Patient Tolerated: Fair Instructions Provided: Adjustment of device    Ortho Devices Type of Ortho Device: Ankle splint Ortho Device/Splint Location: R posterior short leg splint, used plaster this time (was in fiberglass) Ortho Device/Splint Interventions: Application   Post Interventions Patient Tolerated: Fair Instructions Provided: Adjustment of device  Pt reports her previous pre fab fiberglass splint was causing significant heel pain (old splint was padded with ABD pad).  Splint was already removed.  Ortho tech inspected skin, no signs of heel break down, however, tender to the touch.  Pt padded with stokinette and multiple layers of webril, posterior short leg plaster splint applied and then ortho tech attempted to get her into more neutral ankle, however, she has been plantarflexed since 7/4 and did not tolerate getting to neutral or even close to neutral dorsiflexion (she is plantarflexed nearly 30 degrees).  I held her at end range as best I could until the plaster hardened.  No other fracture blisters, or pressure spots seen.  Pt requesting pain meds, Geophysical data processor made aware.    Thanks,  Corinna Capra, PT, DPT  Acute Rehabilitation Ortho Tech Supervisor [phone] pager #[phone] office     Lurena Joiner B Caidon Foti 10/29/2020, 8:17 PM

## 2020-10-29 NOTE — ED Provider Notes (Signed)
Emergency Medicine Provider Triage Evaluation Note  Melinda Padilla , a 30 y.o. female  was evaluated in triage.  Pt complains of patient presents with severe right ankle ankle pain, started today, describes as a burning sensation in the back of her heel, states that on the fourth she had a tibia-fibula fracture, was placed in a splint, [place] follow-up with orthopedic surgery, she is unfortunate unable to do so.  She denies any leaving factors,.  Review of Systems  Positive: Right heel pain and right foot pain Negative: Paresthesias or weakness  Physical Exam  BP 132/78 (BP Location: Left Arm)   Pulse (!) 110   Temp 98 F (36.7 C) (Oral)   Resp (!) 40   Ht 5\' 7"  (1.702 m)   Wt 95 kg   LMP 10/22/2020 (Exact Date)   SpO2 98%   BMI 32.80 kg/m  Gen:   Awake, no distress   Resp:  Normal effort  MSK:   Moves extremities without difficulty  Other:  Splint was removed, palpable pedal pulse, good capillary refill, sensation intact, of the right foot.  Medical Decision Making  Medically screening exam initiated at 7:30 PM.  Appropriate orders placed.  [name] was informed that the remainder of the evaluation will be completed by another provider, this initial triage assessment does not replace that evaluation, and the importance of remaining in the ED until their evaluation is complete.  Patient presents with right heel pain, imaging has been ordered, will order redressing of right ankle, patient will need further work-up in the emergency department.   12/23/2020, PA-C 10/29/20 1932    12/30/20, MD 10/30/20 [phone]

## 2020-10-30 MED ORDER — OXYCODONE-ACETAMINOPHEN 5-325 MG PO TABS: 1.0000 | ORAL_TABLET | Freq: Three times a day (TID) | ORAL | 0 refills | Status: DC | PRN

## 2020-10-30 MED ORDER — IBUPROFEN 200 MG PO TABS: 600.0000 mg | ORAL_TABLET | Freq: Once | ORAL | Status: DC

## 2020-10-30 MED ORDER — OXYCODONE-ACETAMINOPHEN 5-325 MG PO TABS
1.0000 | ORAL_TABLET | Freq: Once | ORAL | Status: AC
  Administered 2020-10-30: 1 via ORAL
  Filled 2020-10-30: qty 1

## 2020-10-30 NOTE — Discharge Instructions (Addendum)
Keep your scheduled appointment with the orthopedist for August 8. Return to the ED as needed for further concerning symptoms.

## 2020-10-30 NOTE — ED Provider Notes (Signed)
[facility]-EMERGENCY DEPT Provider Note   CSN: [id number] Arrival date & time: 10/29/20  1848     History Chief Complaint  Patient presents with   Leg Injury    Melinda Padilla is a 30 y.o. female.  Patient to eD for evaluation of increasing pain and throbbing associated with recent right ankle injury on 7/4, wearing splint since that was placed in the ED. Ortho follow up scheduled for 8/8. She states her pain has not been managed at home and is now experiencing a tingling sensation to her toes and a burning sensation to her heel. Chart reviewed: ankle injury included a displaced, comminuted transsyndesmotic distal fibular fracture and widening of the medial clear space suggestive of medial collateral ligament disruption. No swelling above the short leg splint. No new injury.  The history is provided by the patient. No language interpreter was used.      Past Medical History:  Diagnosis Date   Heart murmur     There are no problems to display for this patient.   History reviewed. No pertinent surgical history.   OB History   No obstetric history on file.     History reviewed. No pertinent family history.  Social History   Tobacco Use   Smoking status: Every Day    Packs/day: 1.00    Pack years: 0.00    Types: Cigarettes   Smokeless tobacco: Never  Vaping Use   Vaping Use: Never used  Substance Use Topics   Alcohol use: No   Drug use: Yes    Types: Marijuana    Home Medications Prior to Admission medications   Medication Sig Start Date End Date Taking? Authorizing Provider  oxyCODONE-acetaminophen (PERCOCET/ROXICET) 5-325 MG tablet Take 1 tablet by mouth every 8 (eight) hours as needed for severe pain. 10/22/20   Khatri, Hillary Bow, PA-C    Allergies    Latex  Review of Systems   Review of Systems  Constitutional:  Negative for chills and fever.  Respiratory: Negative.  Negative for shortness of breath.   Cardiovascular: Negative.   Negative for chest pain and leg swelling.  Musculoskeletal:        See HPI.  Skin: Negative.  Negative for color change.  Neurological:  Negative for weakness and numbness.       Paresthesia of toes on right (splinted foot).    Physical Exam Updated Vital Signs BP 131/82 (BP Location: Left Arm)   Pulse 68   Temp 98 F (36.7 C) (Oral)   Resp 20   Ht 5\' 7"  (1.702 m)   Wt 95 kg   LMP 10/22/2020 (Exact Date)   SpO2 98%   BMI 32.80 kg/m   Physical Exam Vitals and nursing note reviewed.  Constitutional:      General: She is not in acute distress.    Appearance: She is well-developed. She is not ill-appearing.  Pulmonary:     Effort: Pulmonary effort is normal.  Musculoskeletal:        General: Normal range of motion.     Cervical back: Normal range of motion.     Comments: Patient examined after splint change during triage and MSE process. No swelling or discoloration of right toes or proximal splint border. Good cap refill.   Skin:    General: Skin is warm and dry.  Neurological:     Mental Status: She is alert and oriented to person, place, and time.    ED Results / Procedures / Treatments  Labs (all labs ordered are listed, but only abnormal results are displayed) Labs Reviewed - No data to display  EKG None  Radiology DG Ankle Complete Right  Result Date: 10/29/2020 CLINICAL DATA:  Increased pain status post knee ankle fracture EXAM: RIGHT ANKLE - COMPLETE 3+ VIEW COMPARISON:  October 22, 2020. FINDINGS: Similar appearance of the transsyndesmotic comminuted distal fibular fracture with lateral posterior displacement of the distal fracture fragment. There is associated widening of the medial clear space. Associated subcutaneous edema. IMPRESSION: Similar appearance of the transsyndesmotic distal fibular fracture and medial clear space widening. Electronically Signed   By: Maudry Mayhew MD   On: 10/29/2020 20:34    Procedures Procedures   Medications Ordered in  ED Medications  oxyCODONE (Oxy IR/ROXICODONE) immediate release tablet 5 mg (5 mg Oral Given 10/29/20 2020)    ED Course  I have reviewed the triage vital signs and the nursing notes.  Pertinent labs & imaging results that were available during my care of the patient were reviewed by me and considered in my medical decision making (see chart for details).    MDM Rules/Calculators/A&P                          The patient reports presenting symptoms are improved or resolved after splint change. Will provide additional Percocet as she has demonstrated responsible use and has a significant time until ortho follow up.   Final Clinical Impression(s) / ED Diagnoses Final diagnoses:  None   Splint change  Rx / DC Orders ED Discharge Orders     None        Danne Harbor 10/30/20 2355    Tegeler, Canary Brim, MD 10/30/20 [phone]

## 2020-11-22 ENCOUNTER — Other Ambulatory Visit: Payer: Self-pay

## 2020-11-22 ENCOUNTER — Encounter: Payer: Self-pay | Admitting: Orthopedic Surgery

## 2020-11-22 ENCOUNTER — Ambulatory Visit (INDEPENDENT_AMBULATORY_CARE_PROVIDER_SITE_OTHER): Payer: 59 | Admitting: Orthopedic Surgery

## 2020-11-22 ENCOUNTER — Ambulatory Visit (INDEPENDENT_AMBULATORY_CARE_PROVIDER_SITE_OTHER): Payer: 59

## 2020-11-22 DIAGNOSIS — S8261XA Displaced fracture of lateral malleolus of right fibula, initial encounter for closed fracture: Secondary | ICD-10-CM | POA: Diagnosis not present

## 2020-11-22 DIAGNOSIS — M25571 Pain in right ankle and joints of right foot: Secondary | ICD-10-CM | POA: Diagnosis not present

## 2020-11-22 MED ORDER — OXYCODONE-ACETAMINOPHEN 5-325 MG PO TABS: 1.0000 | ORAL_TABLET | ORAL | 0 refills | Status: DC | PRN

## 2020-11-22 NOTE — Progress Notes (Signed)
Office Visit Note   Patient: Melinda Padilla           Date of Birth: February 27, 1991           MRN: [id number] Visit Date: 11/22/2020              Requested by: No referring provider defined for this encounter. PCP: Patient, No Pcp Per (Inactive)  Chief Complaint  Patient presents with   Right Ankle - Pain, Injury      HPI: Patient is a 30 year old woman who states that a month ago on October 22, 2020 she was squatting and lost her balance to the right and she states her foot went to the left.  Patient states she has had difficulty with insurance and has been unable to be seen for her ankle fracture.  Assessment & Plan: Visit Diagnoses:  1. Pain in right ankle and joints of right foot   2. Closed displaced fracture of lateral malleolus of right fibula, initial encounter     Plan:  We will have patient seen by Melinda Padilla tomorrow morning with anticipated surgery.  We will place her in a fracture boot at this time.  Discussed the importance of not smoking for the next 4 weeks.  Prescription provided for Percocet.  Follow-Up Instructions: Return in about 1 day (around 11/23/2020) for Follow-up with Melinda Padilla tomorrow Friday.     Ortho Exam  Patient is alert, oriented, no adenopathy, well-dressed, normal affect, normal respiratory effort. Examination patient has swelling ecchymosis and bruising around the right ankle there are no blisters no skin breakdown.  She has a displaced Weber B fibular fracture.  The deltoid is tender to palpation.  Imaging: XR Ankle Complete Right  Result Date: 11/22/2020 Three-view radiographs of the right ankle shows a widened mortise with a displaced Weber B fibular fracture.  The syndesmosis is intact.  No images are attached to the encounter.  Labs: No results found for: HGBA1C, ESRSEDRATE, CRP, LABURIC, REPTSTATUS, GRAMSTAIN, CULT, LABORGA   Lab Results  Component Value Date   ALBUMIN 4.1 07/04/2018    No results found for: MG No results found for:  VD25OH  No results found for: PREALBUMIN CBC EXTENDED Latest Ref Rng & Units 07/04/2018 10/09/2012 10/09/2012  WBC 4.0 - 10.5 K/uL 8.4 - 13.7(H)  RBC 3.87 - 5.11 MIL/uL 4.85 - 5.23(H)  HGB 12.0 - 15.0 g/dL 56.4 16.0(H) 14.8  HCT 36.0 - 46.0 % 44.1 47.0(H) 42.5  PLT 150 - 400 K/uL 196 - 230  NEUTROABS 1.7 - 7.7 K/uL 6.9 - 7.5  LYMPHSABS 0.7 - 4.0 K/uL 0.6(L) - 5.1(H)     There is no height or weight on file to calculate BMI.  Orders:  Orders Placed This Encounter  Procedures   XR Ankle Complete Right   No orders of the defined types were placed in this encounter.    Procedures: No procedures performed  Clinical Data: No additional findings.  ROS:  All other systems negative, except as noted in the HPI. Review of Systems  Objective: Vital Signs: There were no vitals taken for this visit.  Specialty Comments:  No specialty comments available.  PMFS History: There are no problems to display for this patient.  Past Medical History:  Diagnosis Date   Heart murmur     History reviewed. No pertinent family history.  History reviewed. No pertinent surgical history. Social History   Occupational History   Not on file  Tobacco Use   Smoking status: Every Day  Packs/day: 1.00    Types: Cigarettes   Smokeless tobacco: Never  Vaping Use   Vaping Use: Never used  Substance and Sexual Activity   Alcohol use: No   Drug use: Yes    Types: Marijuana   Sexual activity: Never    Birth control/protection: None

## 2020-11-23 ENCOUNTER — Encounter (HOSPITAL_BASED_OUTPATIENT_CLINIC_OR_DEPARTMENT_OTHER): Payer: Self-pay | Admitting: Orthopaedic Surgery

## 2020-11-26 ENCOUNTER — Ambulatory Visit (HOSPITAL_BASED_OUTPATIENT_CLINIC_OR_DEPARTMENT_OTHER): Payer: 59 | Admitting: Certified Registered"

## 2020-11-26 ENCOUNTER — Other Ambulatory Visit: Payer: Self-pay

## 2020-11-26 ENCOUNTER — Encounter (HOSPITAL_BASED_OUTPATIENT_CLINIC_OR_DEPARTMENT_OTHER): Payer: Self-pay | Admitting: Orthopaedic Surgery

## 2020-11-26 ENCOUNTER — Ambulatory Visit (HOSPITAL_COMMUNITY): Payer: 59

## 2020-11-26 ENCOUNTER — Ambulatory Visit (HOSPITAL_BASED_OUTPATIENT_CLINIC_OR_DEPARTMENT_OTHER)
Admission: RE | Admit: 2020-11-26 | Discharge: 2020-11-26 | Disposition: A | Discharge status: Home / Self Care | Payer: 59 | Attending: Orthopaedic Surgery | Admitting: Orthopaedic Surgery

## 2020-11-26 ENCOUNTER — Encounter (HOSPITAL_BASED_OUTPATIENT_CLINIC_OR_DEPARTMENT_OTHER)
Admission: RE | Disposition: A | Discharge status: Home / Self Care | Payer: Self-pay | Source: Home / Self Care | Attending: Orthopaedic Surgery

## 2020-11-26 DIAGNOSIS — X58XXXA Exposure to other specified factors, initial encounter: Secondary | ICD-10-CM | POA: Diagnosis not present

## 2020-11-26 DIAGNOSIS — Z419 Encounter for procedure for purposes other than remedying health state, unspecified: Secondary | ICD-10-CM

## 2020-11-26 DIAGNOSIS — S8261XA Displaced fracture of lateral malleolus of right fibula, initial encounter for closed fracture: Secondary | ICD-10-CM | POA: Insufficient documentation

## 2020-11-26 DIAGNOSIS — Z87891 Personal history of nicotine dependence: Secondary | ICD-10-CM | POA: Insufficient documentation

## 2020-11-26 HISTORY — PX: ORIF ANKLE FRACTURE: SHX5408

## 2020-11-26 LAB — POCT PREGNANCY, URINE: Preg Test, Ur: NEGATIVE

## 2020-11-26 SURGERY — OPEN REDUCTION INTERNAL FIXATION (ORIF) ANKLE FRACTURE
Anesthesia: General | Site: Ankle | Laterality: Right

## 2020-11-26 MED ORDER — FENTANYL CITRATE (PF) 100 MCG/2ML IJ SOLN
INTRAMUSCULAR | Status: DC | PRN
  Administered 2020-11-26 (×2): 50 ug via INTRAVENOUS

## 2020-11-26 MED ORDER — CEFAZOLIN SODIUM-DEXTROSE 2-4 GM/100ML-% IV SOLN
2.0000 g | INTRAVENOUS | Status: AC
  Administered 2020-11-26: 3 g via INTRAVENOUS

## 2020-11-26 MED ORDER — MIDAZOLAM HCL 2 MG/2ML IJ SOLN
2.0000 mg | Freq: Once | INTRAMUSCULAR | Status: AC
  Administered 2020-11-26: 2 mg via INTRAVENOUS

## 2020-11-26 MED ORDER — MIDAZOLAM HCL 2 MG/2ML IJ SOLN
INTRAMUSCULAR | Status: AC
  Filled 2020-11-26: qty 2

## 2020-11-26 MED ORDER — CEFAZOLIN SODIUM-DEXTROSE 2-4 GM/100ML-% IV SOLN
INTRAVENOUS | Status: AC
  Filled 2020-11-26: qty 100

## 2020-11-26 MED ORDER — OXYCODONE HCL 5 MG/5ML PO SOLN
5.0000 mg | Freq: Once | ORAL | Status: DC | PRN
Start: 2020-11-26 — End: 2020-11-26

## 2020-11-26 MED ORDER — FENTANYL CITRATE (PF) 100 MCG/2ML IJ SOLN
INTRAMUSCULAR | Status: AC
  Filled 2020-11-26: qty 2

## 2020-11-26 MED ORDER — KETOROLAC TROMETHAMINE 30 MG/ML IJ SOLN
INTRAMUSCULAR | Status: AC
  Filled 2020-11-26: qty 1

## 2020-11-26 MED ORDER — ONDANSETRON HCL 4 MG/2ML IJ SOLN
INTRAMUSCULAR | Status: DC | PRN
  Administered 2020-11-26: 4 mg via INTRAVENOUS

## 2020-11-26 MED ORDER — MEPERIDINE HCL 25 MG/ML IJ SOLN: 6.2500 mg | INTRAMUSCULAR | Status: DC | PRN

## 2020-11-26 MED ORDER — LACTATED RINGERS IV SOLN: INTRAVENOUS | Status: DC

## 2020-11-26 MED ORDER — OXYCODONE HCL 5 MG PO TABS: 5.0000 mg | ORAL_TABLET | Freq: Once | ORAL | Status: DC | PRN

## 2020-11-26 MED ORDER — MIDAZOLAM HCL 5 MG/5ML IJ SOLN
INTRAMUSCULAR | Status: DC | PRN
  Administered 2020-11-26: .5 mg via INTRAVENOUS

## 2020-11-26 MED ORDER — ONDANSETRON HCL 4 MG/2ML IJ SOLN
INTRAMUSCULAR | Status: AC
  Filled 2020-11-26: qty 2

## 2020-11-26 MED ORDER — CEFAZOLIN SODIUM-DEXTROSE 1-4 GM/50ML-% IV SOLN
INTRAVENOUS | Status: AC
  Filled 2020-11-26: qty 50

## 2020-11-26 MED ORDER — DEXAMETHASONE SODIUM PHOSPHATE 10 MG/ML IJ SOLN
INTRAMUSCULAR | Status: AC
  Filled 2020-11-26: qty 1

## 2020-11-26 MED ORDER — LIDOCAINE HCL (CARDIAC) PF 100 MG/5ML IV SOSY
PREFILLED_SYRINGE | INTRAVENOUS | Status: DC | PRN
  Administered 2020-11-26: 60 mg via INTRAVENOUS

## 2020-11-26 MED ORDER — AMISULPRIDE (ANTIEMETIC) 5 MG/2ML IV SOLN: 10.0000 mg | Freq: Once | INTRAVENOUS | Status: DC | PRN

## 2020-11-26 MED ORDER — PROPOFOL 10 MG/ML IV BOLUS
INTRAVENOUS | Status: DC | PRN
Start: 2020-11-26 — End: 2020-11-26
  Administered 2020-11-26: 200 mg via INTRAVENOUS

## 2020-11-26 MED ORDER — PROMETHAZINE HCL 25 MG/ML IJ SOLN: 6.2500 mg | INTRAMUSCULAR | Status: DC | PRN

## 2020-11-26 MED ORDER — ZINC SULFATE 220 (50 ZN) MG PO CAPS: 220.0000 mg | ORAL_CAPSULE | Freq: Every day | ORAL | 0 refills | Status: DC

## 2020-11-26 MED ORDER — ROPIVACAINE HCL 5 MG/ML IJ SOLN
INTRAMUSCULAR | Status: DC | PRN
  Administered 2020-11-26: 30 mL via PERINEURAL

## 2020-11-26 MED ORDER — DEXAMETHASONE SODIUM PHOSPHATE 10 MG/ML IJ SOLN
INTRAMUSCULAR | Status: DC | PRN
  Administered 2020-11-26: 10 mg via INTRAVENOUS

## 2020-11-26 MED ORDER — HYDROMORPHONE HCL 1 MG/ML IJ SOLN: 0.2500 mg | INTRAMUSCULAR | Status: DC | PRN

## 2020-11-26 MED ORDER — ASPIRIN EC 81 MG PO TBEC: 81.0000 mg | DELAYED_RELEASE_TABLET | Freq: Two times a day (BID) | ORAL | 0 refills | Status: DC

## 2020-11-26 MED ORDER — PROPOFOL 500 MG/50ML IV EMUL
INTRAVENOUS | Status: AC
  Filled 2020-11-26: qty 50

## 2020-11-26 MED ORDER — TRAMADOL HCL 50 MG PO TABS: 50.0000 mg | ORAL_TABLET | Freq: Every day | ORAL | 0 refills | Status: DC | PRN

## 2020-11-26 MED ORDER — KETOROLAC TROMETHAMINE 30 MG/ML IJ SOLN
INTRAMUSCULAR | Status: DC | PRN
  Administered 2020-11-26: 30 mg via INTRAVENOUS

## 2020-11-26 MED ORDER — FENTANYL CITRATE (PF) 100 MCG/2ML IJ SOLN
100.0000 ug | Freq: Once | INTRAMUSCULAR | Status: AC
  Administered 2020-11-26: 100 ug via INTRAVENOUS

## 2020-11-26 MED ORDER — 0.9 % SODIUM CHLORIDE (POUR BTL) OPTIME
TOPICAL | Status: DC | PRN
  Administered 2020-11-26: 3000 mL

## 2020-11-26 MED ORDER — CALCIUM CARBONATE-VITAMIN D 500-200 MG-UNIT PO TABS: 1.0000 | ORAL_TABLET | Freq: Three times a day (TID) | ORAL | 6 refills | Status: DC

## 2020-11-26 SURGICAL SUPPLY — 92 items
BANDAGE ESMARK 6X9 LF (GAUZE/BANDAGES/DRESSINGS) ×1 IMPLANT
BIT DRILL 2.4X140 LONG SOLID (BIT) ×2 IMPLANT
BIT DRILL 2.8 (BIT) ×2
BIT DRILL LNG 140X2.8XSLD (BIT) ×1 IMPLANT
BIT DRILL SOLID 2.0 X 110MM (DRILL) ×1 IMPLANT
BIT DRL LNG 140X2.8XSLD (BIT) ×1
BLADE HEX COATED 2.75 (ELECTRODE) IMPLANT
BLADE SURG 15 STRL LF DISP TIS (BLADE) ×2 IMPLANT
BLADE SURG 15 STRL SS (BLADE) ×4
BNDG CMPR 9X6 STRL LF SNTH (GAUZE/BANDAGES/DRESSINGS) ×1
BNDG COHESIVE 6X5 TAN ST LF (GAUZE/BANDAGES/DRESSINGS) ×2 IMPLANT
BNDG ELASTIC 4X5.8 VLCR STR LF (GAUZE/BANDAGES/DRESSINGS) IMPLANT
BNDG ELASTIC 6X5.8 VLCR STR LF (GAUZE/BANDAGES/DRESSINGS) ×2 IMPLANT
BNDG ESMARK 6X9 LF (GAUZE/BANDAGES/DRESSINGS) ×2
BRUSH SCRUB EZ PLAIN DRY (MISCELLANEOUS) ×2 IMPLANT
CANISTER SUCT 1200ML W/VALVE (MISCELLANEOUS) ×2 IMPLANT
COVER BACK TABLE 60X90IN (DRAPES) ×2 IMPLANT
COVER MAYO STAND STRL (DRAPES) IMPLANT
CUFF TOURN SGL QUICK 34 (TOURNIQUET CUFF)
CUFF TRNQT CYL 34X4.125X (TOURNIQUET CUFF) IMPLANT
DECANTER SPIKE VIAL GLASS SM (MISCELLANEOUS) IMPLANT
DRAPE C-ARM 42X72 X-RAY (DRAPES) ×2 IMPLANT
DRAPE C-ARMOR (DRAPES) ×2 IMPLANT
DRAPE EXTREMITY T 121X128X90 (DISPOSABLE) ×2 IMPLANT
DRAPE IMP U-DRAPE 54X76 (DRAPES) ×2 IMPLANT
DRAPE INCISE IOBAN 66X45 STRL (DRAPES) IMPLANT
DRAPE SURG 17X23 STRL (DRAPES) ×4 IMPLANT
DRAPE U-SHAPE 47X51 STRL (DRAPES) ×2 IMPLANT
DRILL SOLID 2.0 X 110MM (DRILL) ×2
DRSG PAD ABDOMINAL 8X10 ST (GAUZE/BANDAGES/DRESSINGS) ×4 IMPLANT
DURAPREP 26ML APPLICATOR (WOUND CARE) ×4 IMPLANT
ELECT REM PT RETURN 9FT ADLT (ELECTROSURGICAL) ×2
ELECTRODE REM PT RTRN 9FT ADLT (ELECTROSURGICAL) ×1 IMPLANT
GAUZE SPONGE 4X4 12PLY STRL (GAUZE/BANDAGES/DRESSINGS) ×2 IMPLANT
GAUZE XEROFORM 1X8 LF (GAUZE/BANDAGES/DRESSINGS) ×2 IMPLANT
GLOVE SURG NEOP MICRO LF SZ7.5 (GLOVE) ×2 IMPLANT
GLOVE SURG SYN 7.5  E (GLOVE) ×1
GLOVE SURG SYN 7.5 E (GLOVE) ×1 IMPLANT
GLOVE SURG UNDER POLY LF SZ7 (GLOVE) ×2 IMPLANT
GLOVE SURG UNDER POLY LF SZ7.5 (GLOVE) ×2 IMPLANT
GOWN STRL REIN XL XLG (GOWN DISPOSABLE) ×2 IMPLANT
GOWN STRL REUS W/ TWL LRG LVL3 (GOWN DISPOSABLE) ×1 IMPLANT
GOWN STRL REUS W/ TWL XL LVL3 (GOWN DISPOSABLE) ×1 IMPLANT
GOWN STRL REUS W/TWL LRG LVL3 (GOWN DISPOSABLE) ×2
GOWN STRL REUS W/TWL XL LVL3 (GOWN DISPOSABLE) ×2
MANIFOLD NEPTUNE II (INSTRUMENTS) ×2 IMPLANT
NEEDLE HYPO 22GX1.5 SAFETY (NEEDLE) IMPLANT
NS IRRIG 1000ML POUR BTL (IV SOLUTION) ×2 IMPLANT
PACK BASIN DAY SURGERY FS (CUSTOM PROCEDURE TRAY) ×2 IMPLANT
PAD CAST 3X4 CTTN HI CHSV (CAST SUPPLIES) IMPLANT
PAD CAST 4YDX4 CTTN HI CHSV (CAST SUPPLIES) IMPLANT
PADDING CAST COTTON 3X4 STRL (CAST SUPPLIES)
PADDING CAST COTTON 4X4 STRL (CAST SUPPLIES)
PADDING CAST COTTON 6X4 STRL (CAST SUPPLIES) IMPLANT
PADDING CAST SYN 6 (CAST SUPPLIES) ×1
PADDING CAST SYNTHETIC 4 (CAST SUPPLIES) ×1
PADDING CAST SYNTHETIC 4X4 STR (CAST SUPPLIES) ×1 IMPLANT
PADDING CAST SYNTHETIC 6X4 NS (CAST SUPPLIES) ×1 IMPLANT
PENCIL SMOKE EVACUATOR (MISCELLANEOUS) ×2 IMPLANT
PLATE FIBULA 9HOLE ANATOMICAL (Plate) ×2 IMPLANT
PUTTY DBM STAGRAFT 2CC (Putty) ×2 IMPLANT
SCREW 3.5X16 NONLOCKING (Screw) ×4 IMPLANT
SCREW LOCK PLATE R3 2.7X17 (Screw) ×6 IMPLANT
SCREW LOCK PLATE R3 4.2X16 (Screw) ×2 IMPLANT
SCREW LOCK PLT 16X4.2XNS R3CON (Screw) ×1 IMPLANT
SCREW NON LOCK 3.5X18 (Screw) ×2 IMPLANT
SCREW NON LOCKING 3.5X12 (Screw) ×2 IMPLANT
SCREW NON LOCKING 3.5X14 (Screw) ×2 IMPLANT
SHEET MEDIUM DRAPE 40X70 STRL (DRAPES) ×4 IMPLANT
SLEEVE SCD COMPRESS KNEE MED (STOCKING) ×2 IMPLANT
SPLINT FIBERGLASS 4X30 (CAST SUPPLIES) IMPLANT
SPONGE T-LAP 18X18 ~~LOC~~+RFID (SPONGE) ×2 IMPLANT
STOCKINETTE 6  STRL (DRAPES) ×1
STOCKINETTE 6 STRL (DRAPES) ×1 IMPLANT
STOCKINETTE TUBULAR 6 INCH (GAUZE/BANDAGES/DRESSINGS) ×2 IMPLANT
SUCTION FRAZIER HANDLE 10FR (MISCELLANEOUS) ×1
SUCTION TUBE FRAZIER 10FR DISP (MISCELLANEOUS) ×1 IMPLANT
SUT ETHILON 3 0 PS 1 (SUTURE) IMPLANT
SUT VIC AB 0 CT1 27 (SUTURE)
SUT VIC AB 0 CT1 27XBRD ANBCTR (SUTURE) IMPLANT
SUT VIC AB 2-0 CT1 27 (SUTURE) ×2
SUT VIC AB 2-0 CT1 TAPERPNT 27 (SUTURE) ×1 IMPLANT
SUT VIC AB 3-0 SH 27 (SUTURE)
SUT VIC AB 3-0 SH 27X BRD (SUTURE) IMPLANT
SYR BULB EAR ULCER 3OZ GRN STR (SYRINGE) IMPLANT
SYR CONTROL 10ML LL (SYRINGE) IMPLANT
TOWEL GREEN STERILE FF (TOWEL DISPOSABLE) ×2 IMPLANT
TRAY DSU PREP LF (CUSTOM PROCEDURE TRAY) ×2 IMPLANT
TUBE CONNECTING 20X1/4 (TUBING) ×2 IMPLANT
UNDERPAD 30X36 HEAVY ABSORB (UNDERPADS AND DIAPERS) ×2 IMPLANT
WIRE OLIVE SMOOTH 1.4MMX60MM (WIRE) ×4 IMPLANT
YANKAUER SUCT BULB TIP NO VENT (SUCTIONS) ×2 IMPLANT

## 2020-11-26 NOTE — Anesthesia Procedure Notes (Signed)
Procedure Name: LMA Insertion Date/Time: 11/26/2020 1:09 PM Performed by: Lance Coon, CRNA Pre-anesthesia Checklist: Patient identified, Emergency Drugs available, Suction available and Patient being monitored Patient Re-evaluated:Patient Re-evaluated prior to induction Oxygen Delivery Method: Circle system utilized Preoxygenation: Pre-oxygenation with 100% oxygen Induction Type: IV induction Ventilation: Mask ventilation without difficulty LMA: LMA inserted LMA Size: 4.0 Number of attempts: 1 Airway Equipment and Method: Bite block Placement Confirmation: positive ETCO2 Tube secured with: Tape Dental Injury: Teeth and Oropharynx as per pre-operative assessment

## 2020-11-26 NOTE — H&P (Signed)
    PREOPERATIVE H&P  Chief Complaint: right lateral malleolus fracture  HPI: Melinda Padilla is a 30 y.o. female who presents for surgical treatment of right lateral malleolus fracture.  She denies any changes in medical history.  Past Medical History:  Diagnosis Date   Heart murmur    Past Surgical History:  Procedure Laterality Date   NO PAST SURGERIES     Social History   Socioeconomic History   Marital status: Single    Spouse name: Not on file   Number of children: Not on file   Years of education: Not on file   Highest education level: Not on file  Occupational History   Not on file  Tobacco Use   Smoking status: Former    Packs/day: 1.00    Types: Cigarettes    Quit date: 08/19/2020    Years since quitting: 0.2   Smokeless tobacco: Never  Vaping Use   Vaping Use: Some days  Substance and Sexual Activity   Alcohol use: No   Drug use: Yes    Types: Marijuana   Sexual activity: Never    Birth control/protection: None  Other Topics Concern   Not on file  Social History Narrative   Not on file   Social Determinants of Health   Financial Resource Strain: Not on file  Food Insecurity: Not on file  Transportation Needs: Not on file  Physical Activity: Not on file  Stress: Not on file  Social Connections: Not on file   History reviewed. No pertinent family history. Allergies  Allergen Reactions   Latex Hives   Prior to Admission medications   Medication Sig Start Date End Date Taking? Authorizing Provider  oxyCODONE-acetaminophen (PERCOCET/ROXICET) 5-325 MG tablet Take 1 tablet by mouth every 8 (eight) hours as needed for severe pain. 10/30/20  Yes Elpidio Anis, PA-C  oxyCODONE-acetaminophen (PERCOCET/ROXICET) 5-325 MG tablet Take 1 tablet by mouth every 4 (four) hours as needed for severe pain. 11/22/20  Yes Nadara Mustard, MD     Positive ROS: All other systems have been reviewed and were otherwise negative with the exception of those mentioned in the  HPI and as above.  Physical Exam: General: Alert, no acute distress Cardiovascular: No pedal edema Respiratory: No cyanosis, no use of accessory musculature GI: abdomen soft Skin: No lesions in the area of chief complaint Neurologic: Sensation intact distally Psychiatric: Patient is competent for consent with normal mood and affect Lymphatic: no lymphedema  MUSCULOSKELETAL: exam stable  Assessment: right lateral malleolus fracture  Plan: Plan for Procedure(s): OPEN REDUCTION INTERNAL FIXATION (ORIF) RIGHT LATERAL MALLEOLUS  The risks benefits and alternatives were discussed with the patient including but not limited to the risks of nonoperative treatment, versus surgical intervention including infection, bleeding, nerve injury,  blood clots, cardiopulmonary complications, morbidity, mortality, among others, and they were willing to proceed.   Preoperative templating of the joint replacement has been completed, documented, and submitted to the Operating Room personnel in order to optimize intra-operative equipment management.   Glee Arvin, MD 11/26/2020 12:40 PM

## 2020-11-26 NOTE — Discharge Instructions (Addendum)
    1. Keep splint clean and dry 2. Elevate foot above level of the heart 3. Take aspirin to prevent blood clots 4. Take pain meds as needed 5. Strict non weight bearing to operative extremity  May take NSAIDS (Ibuprofen, Motrin) after 8:50pm, if needed.   Post Anesthesia Home Care Instructions  Activity: Get plenty of rest for the remainder of the day. A responsible individual must stay with you for 24 hours following the procedure.  For the next 24 hours, DO NOT: -Drive a car -Advertising copywriter -Drink alcoholic beverages -Take any medication unless instructed by your physician -Make any legal decisions or sign important papers.  Meals: Start with liquid foods such as gelatin or soup. Progress to regular foods as tolerated. Avoid greasy, spicy, heavy foods. If nausea and/or vomiting occur, drink only clear liquids until the nausea and/or vomiting subsides. Call your physician if vomiting continues.  Special Instructions/Symptoms: Your throat may feel dry or sore from the anesthesia or the breathing tube placed in your throat during surgery. If this causes discomfort, gargle with warm salt water. The discomfort should disappear within 24 hours.  If you had a scopolamine patch placed behind your ear for the management of post- operative nausea and/or vomiting:  1. The medication in the patch is effective for 72 hours, after which it should be removed.  Wrap patch in a tissue and discard in the trash. Wash hands thoroughly with soap and water. 2. You may remove the patch earlier than 72 hours if you experience unpleasant side effects which may include dry mouth, dizziness or visual disturbances. 3. Avoid touching the patch. Wash your hands with soap and water after contact with the patch.      Regional Anesthesia Blocks  1. Numbness or the inability to move the "blocked" extremity may last from 3-48 hours after placement. The length of time depends on the medication injected and  your individual response to the medication. If the numbness is not going away after 48 hours, call your surgeon.  2. The extremity that is blocked will need to be protected until the numbness is gone and the  Strength has returned. Because you cannot feel it, you will need to take extra care to avoid injury. Because it may be weak, you may have difficulty moving it or using it. You may not know what position it is in without looking at it while the block is in effect.  3. For blocks in the legs and feet, returning to weight bearing and walking needs to be done carefully. You will need to wait until the numbness is entirely gone and the strength has returned. You should be able to move your leg and foot normally before you try and bear weight or walk. You will need someone to be with you when you first try to ensure you do not fall and possibly risk injury.  4. Bruising and tenderness at the needle site are common side effects and will resolve in a few days.  5. Persistent numbness or new problems with movement should be communicated to the surgeon or the [facility] [phone] [facility] [phone]).

## 2020-11-26 NOTE — Transfer of Care (Signed)
Immediate Anesthesia Transfer of Care Note  Patient: Melinda Padilla  Procedure(s) Performed: OPEN REDUCTION INTERNAL FIXATION (ORIF) RIGHT LATERAL MALLEOLUS (Right: Ankle)  Patient Location: PACU  Anesthesia Type:GA combined with regional for post-op pain  Level of Consciousness: awake  Airway & Oxygen Therapy: Patient Spontanous Breathing and Patient connected to face mask oxygen  Post-op Assessment: Report given to RN and Post -op Vital signs reviewed and stable  Post vital signs: Reviewed and stable  Last Vitals:  Vitals Value Taken Time  BP 117/82 11/26/20 1509  Temp    Pulse 82 11/26/20 1510  Resp 16 11/26/20 1510  SpO2 97 % 11/26/20 1510  Vitals shown include unvalidated device data.  Last Pain:  Vitals:   11/26/20 1036  TempSrc: Oral  PainSc: 2          Complications: No notable events documented.

## 2020-11-26 NOTE — Anesthesia Preprocedure Evaluation (Signed)
Anesthesia Evaluation  Patient identified by MRN, date of birth, ID band Patient awake    Reviewed: Allergy & Precautions, NPO status , Patient's Chart, lab work & pertinent test results  Airway Mallampati: II  TM Distance: >3 FB Neck ROM: Full    Dental no notable dental hx.    Pulmonary neg pulmonary ROS, Patient abstained from smoking., former smoker,    Pulmonary exam normal breath sounds clear to auscultation       Cardiovascular negative cardio ROS Normal cardiovascular exam Rhythm:Regular Rate:Normal     Neuro/Psych negative neurological ROS  negative psych ROS   GI/Hepatic negative GI ROS, Neg liver ROS,   Endo/Other  Morbid obesity  Renal/GU negative Renal ROS  negative genitourinary   Musculoskeletal negative musculoskeletal ROS (+)   Abdominal (+) + obese,   Peds negative pediatric ROS (+)  Hematology negative hematology ROS (+)   Anesthesia Other Findings   Reproductive/Obstetrics negative OB ROS                             Anesthesia Physical Anesthesia Plan  ASA: 3  Anesthesia Plan: General   Post-op Pain Management:  Regional for Post-op pain   Induction: Intravenous  PONV Risk Score and Plan: 3 and Ondansetron, Dexamethasone, Midazolam and Treatment may vary due to age or medical condition  Airway Management Planned: LMA  Additional Equipment:   Intra-op Plan:   Post-operative Plan: Extubation in OR  Informed Consent: I have reviewed the patients History and Physical, chart, labs and discussed the procedure including the risks, benefits and alternatives for the proposed anesthesia with the patient or authorized representative who has indicated his/her understanding and acceptance.     Dental advisory given  Plan Discussed with: CRNA  Anesthesia Plan Comments:         Anesthesia Quick Evaluation

## 2020-11-26 NOTE — Anesthesia Postprocedure Evaluation (Signed)
Anesthesia Post Note  Patient: Public house manager  Procedure(s) Performed: OPEN REDUCTION INTERNAL FIXATION (ORIF) RIGHT LATERAL MALLEOLUS (Right: Ankle)     Patient location during evaluation: PACU Anesthesia Type: General Level of consciousness: awake and alert Pain management: pain level controlled Vital Signs Assessment: post-procedure vital signs reviewed and stable Respiratory status: spontaneous breathing, nonlabored ventilation and respiratory function stable Cardiovascular status: blood pressure returned to baseline and stable Postop Assessment: no apparent nausea or vomiting Anesthetic complications: no   No notable events documented.  Last Vitals:  Vitals:   11/26/20 1530 11/26/20 1545  BP: 116/81 123/86  Pulse: 71 67  Resp: (!) 22 (!) 9  Temp:    SpO2: 97% 95%    Last Pain:  Vitals:   11/26/20 1601  TempSrc:   PainSc: 0-No pain                 Lowella Curb

## 2020-11-26 NOTE — Op Note (Signed)
Date of Surgery: 11/26/2020  INDICATIONS: Melinda Padilla is a 30 y.o.-year-old female who sustained a right ankle fracture approximately 4 weeks ago.  Unfortunately she had trouble finding an orthopedic office that took her insurance; she was indicated for open reduction and internal fixation due to the displaced nature of the articular fracture and came to the operating room today for this procedure. The patient and Patient did consent to the procedure after discussion of the risks and benefits.  PREOPERATIVE DIAGNOSIS: right lateral malleolus ankle fracture  POSTOPERATIVE DIAGNOSIS: Same.  PROCEDURE: Open treatment of right ankle fracture with internal fixation. Lateral malleolar CPT [phone]  SURGEON: N. Glee Arvin, M.D.  ASSIST: Oneal Grout, PA-C  ANESTHESIA:  general, popliteal block  TOURNIQUET TIME: 1 hour  IV FLUIDS AND URINE: See anesthesia.  ESTIMATED BLOOD LOSS: Minimal mL.  IMPLANTS: [employer]  COMPLICATIONS: see description of procedure.  DESCRIPTION OF PROCEDURE: The patient was brought to the operating room and placed supine on the operating table.  The patient had been signed prior to the procedure and this was documented. The patient had the anesthesia placed by the anesthesiologist.  A nonsterile tourniquet was placed on the upper thigh.  The prep verification and incision time-outs were performed to confirm that this was the correct patient, site, side and location. The patient had an SCD on the opposite lower extremity. The patient did receive antibiotics prior to the incision and was re-dosed during the procedure as needed at indicated intervals.  The patient had the lower extremity prepped and draped in the standard surgical fashion.  The extremity was exsanguinated using an esmarch bandage and the tourniquet was inflated to 300 mm Hg.  A lateral incision was made over the ankle.  Dissection was carried down through the subcutaneous tissue onto the fascia which  was sharply incised in line with the incision.  Subperiosteal elevation was then performed.  Soft tissue releases were performed in order to mobilize the distal fibula.  There was already immature callus within the fracture site which had to be removed in order to mobilize the fracture.  Periosteum was removed from the fracture site as well.  I used combination of curette, rondure, osteotome in order to mobilize the fracture.  There was a small void in the bone.  Once the fracture was adequately mobilized provisional reduction was obtained and checked under fluoroscopy.  The clamp was then released and I first placed about 1.5 cc of DBM putty within the fracture site.  I then we reduced the fracture with a clamp.  I was able to place a lag screw across the fracture.  A precontoured distal fibula plate was then placed at the appropriate position using fluoroscopic guidance.  The appropriate holes were then filled with nonlocking and locking screws.  Each screw had excellent fixation.  Stress exam of the ankle showed no widening of the medial clear space.  Surgical site was thoroughly irrigated and closed in a layered fashion.  Sterile dressings were applied.  Short leg splint placed.  Patient tolerated procedure well had no many complications.  Melinda Padilla, my PA, was a medical necessity for the entirety of the surgery including opening, closing, limb positioning, retracting, exposing, and repairing.  POSTOPERATIVE PLAN: Melinda Padilla will remain nonweightbearing on this leg for approximately 6 weeks; Melinda Padilla will return for suture removal in 2 weeks.  He will be immobilized in a short leg splint and then transitioned to a CAM walker at his first follow up  appointment.  Melinda Padilla will receive DVT prophylaxis based on other medications, activity level, and risk ratio of bleeding to thrombosis.  Melinda Reel, MD [facility] 3:45 PM

## 2020-11-26 NOTE — Progress Notes (Signed)
Assisted Dr. Miller with right, ultrasound guided block. Side rails up, monitors on throughout procedure. See vital signs in flow sheet. Tolerated Procedure well. 

## 2020-11-26 NOTE — Anesthesia Procedure Notes (Signed)
Anesthesia Regional Block: Popliteal block   Pre-Anesthetic Checklist: , timeout performed,  Correct Patient, Correct Site, Correct Laterality,  Correct Procedure, Correct Position, site marked,  Risks and benefits discussed,  Surgical consent,  Pre-op evaluation,  At surgeon's request and post-op pain management  Laterality: Right  Prep: chloraprep       Needles:  Injection technique: Single-shot  Needle Type: Stimiplex     Needle Length: 9cm  Needle Gauge: 21     Additional Needles:   Procedures:,,,, ultrasound used (permanent image in chart),,    Narrative:  Start time: 11/26/2020 12:22 PM End time: 11/26/2020 12:27 PM Injection made incrementally with aspirations every 5 mL.  Performed by: Personally  Anesthesiologist: Lowella Curb, MD

## 2020-11-27 ENCOUNTER — Encounter (HOSPITAL_BASED_OUTPATIENT_CLINIC_OR_DEPARTMENT_OTHER): Payer: Self-pay | Admitting: Orthopaedic Surgery

## 2020-12-11 ENCOUNTER — Encounter: Payer: Self-pay | Admitting: Orthopaedic Surgery

## 2020-12-11 ENCOUNTER — Ambulatory Visit (INDEPENDENT_AMBULATORY_CARE_PROVIDER_SITE_OTHER): Payer: 59

## 2020-12-11 ENCOUNTER — Ambulatory Visit (INDEPENDENT_AMBULATORY_CARE_PROVIDER_SITE_OTHER): Payer: 59 | Admitting: Orthopaedic Surgery

## 2020-12-11 VITALS — Ht 67.0 in | Wt 266.0 lb

## 2020-12-11 DIAGNOSIS — S82891D Other fracture of right lower leg, subsequent encounter for closed fracture with routine healing: Secondary | ICD-10-CM | POA: Diagnosis not present

## 2020-12-11 NOTE — Progress Notes (Signed)
Post-Op Visit Note   Patient: Melinda Padilla           Date of Birth: 03-30-1991           MRN: [id number] Visit Date: 12/11/2020 PCP: Patient, No Pcp Per (Inactive)   Assessment & Plan:  Chief Complaint:  Chief Complaint  Patient presents with   Right Ankle - Routine Post Op    ORIF right ankle 11/26/2020   Visit Diagnoses:  1. Closed fracture of right ankle with routine healing, subsequent encounter     Plan: Patient is a pleasant 30 year old female who comes in today 2 weeks status post ORIF right lateral malleolus fracture, date of surgery 11/26/2020.  She has been doing well.  She has been compliant nonweightbearing in a short leg splint.  She has been in a moderate amount of pain which is relieved with narcotic pain medication.  She says she has been elevating for swelling.  No chest current chest pain or shortness of breath.  No personal or family history of dvt/pe.  She is a non-smoker and is not on oral contraceptives.  She has been taking a baby aspirin daily.  Examination of her right ankle reveals a well-healed surgical incision with nylon sutures in place.  Moderate swelling.  Calf is moderately tender.  I am unable to have her dorsiflex her ankle secondary to apprehension.  Decreased sensation to the top of the foot.  At this point, we will remove her sutures and apply Steri-Strips.  We will allow her to transition into a cam walker nonweightbearing.  We have also provided her with an ASO brace to wear at night.  We will go ahead and start her in outpatient physical therapy to work on gentle range of motion without bearing weight.  Internal referral has been made.  For the calf pain, we order an ultrasound to rule out DVT.  She will go to the ED should she develop chest pain or shortness of breath.  She will follow-up with [place] in 4 weeks time for repeat evaluation and three-view x-rays of the right ankle.  Call with concerns or questions in the meantime.  Follow-Up Instructions:  Return in about 4 weeks (around 01/08/2021).   Orders:  Orders Placed This Encounter  Procedures   XR Ankle Complete Right   Ambulatory referral to Physical Therapy   VAS [place] LOWER EXTREMITY VENOUS (DVT)   No orders of the defined types were placed in this encounter.   Imaging: XR Ankle Complete Right  Result Date: 12/11/2020 X-rays demonstrate stable fixation of the fracture without hardware complication   PMFS History: Patient Active Problem List   Diagnosis Date Noted   Displaced fracture of lateral malleolus of right fibula, initial encounter for closed fracture    Past Medical History:  Diagnosis Date   Heart murmur     No family history on file.  Past Surgical History:  Procedure Laterality Date   NO PAST SURGERIES     ORIF ANKLE FRACTURE Right 11/26/2020   Procedure: OPEN REDUCTION INTERNAL FIXATION (ORIF) RIGHT LATERAL MALLEOLUS;  Surgeon: Tarry Kos, MD;  Location: [facility];  Service: Orthopedics;  Laterality: Right;   Social History   Occupational History   Not on file  Tobacco Use   Smoking status: Former    Packs/day: 1.00    Types: Cigarettes    Quit date: 08/19/2020    Years since quitting: 0.3   Smokeless tobacco: Never  Vaping Use  Vaping Use: Some days  Substance and Sexual Activity   Alcohol use: No   Drug use: Yes    Types: Marijuana   Sexual activity: Never    Birth control/protection: None

## 2020-12-12 ENCOUNTER — Other Ambulatory Visit: Payer: Self-pay

## 2020-12-12 ENCOUNTER — Ambulatory Visit (HOSPITAL_COMMUNITY): Payer: 59

## 2020-12-12 ENCOUNTER — Ambulatory Visit (HOSPITAL_COMMUNITY)
Admission: RE | Admit: 2020-12-12 | Discharge: 2020-12-12 | Disposition: A | Discharge status: Home / Self Care | Payer: 59 | Source: Ambulatory Visit | Attending: Orthopaedic Surgery | Admitting: Orthopaedic Surgery

## 2020-12-12 ENCOUNTER — Encounter (HOSPITAL_COMMUNITY): Payer: 59

## 2020-12-12 ENCOUNTER — Telehealth: Payer: Self-pay

## 2020-12-12 DIAGNOSIS — S82891D Other fracture of right lower leg, subsequent encounter for closed fracture with routine healing: Secondary | ICD-10-CM | POA: Diagnosis present

## 2020-12-12 NOTE — Progress Notes (Signed)
Lower extremity venous RT study completed.   Please see CV Proc for preliminary results.   Austen Wygant, RDMS, RVT  

## 2020-12-12 NOTE — Telephone Encounter (Signed)
Pt called stating that her medication was never sent in for pain

## 2020-12-12 NOTE — Telephone Encounter (Signed)
Pt called again asking about her medication again.

## 2020-12-13 ENCOUNTER — Ambulatory Visit: Payer: 59

## 2020-12-13 ENCOUNTER — Other Ambulatory Visit: Payer: Self-pay | Admitting: Physician Assistant

## 2020-12-13 MED ORDER — OXYCODONE-ACETAMINOPHEN 5-325 MG PO TABS: 1.0000 | ORAL_TABLET | Freq: Three times a day (TID) | ORAL | 0 refills | Status: DC | PRN

## 2020-12-13 MED ORDER — METHOCARBAMOL 500 MG PO TABS: 500.0000 mg | ORAL_TABLET | Freq: Two times a day (BID) | ORAL | 0 refills | Status: DC | PRN

## 2020-12-13 NOTE — Telephone Encounter (Signed)
Patient aware.

## 2020-12-13 NOTE — Telephone Encounter (Signed)
Sent in

## 2020-12-14 ENCOUNTER — Other Ambulatory Visit: Payer: Self-pay | Admitting: Physician Assistant

## 2020-12-14 ENCOUNTER — Telehealth: Payer: Self-pay | Admitting: Physician Assistant

## 2020-12-14 MED ORDER — TRAMADOL HCL 50 MG PO TABS: 50.0000 mg | ORAL_TABLET | Freq: Three times a day (TID) | ORAL | 2 refills | Status: DC | PRN

## 2020-12-14 NOTE — Telephone Encounter (Signed)
Pt called requesting a refill of oxycodone. Pt states she called to pick up meds and they informed that medication was not filled and need Dr. Roda Shutters or PA Dub Mikes to resend. Please call pt about this matter at [phone].

## 2020-12-14 NOTE — Telephone Encounter (Signed)
Patient aware.  Tramadol sent into pharm patient aware.

## 2020-12-18 ENCOUNTER — Ambulatory Visit: Payer: 59 | Attending: Physician Assistant

## 2020-12-18 ENCOUNTER — Other Ambulatory Visit: Payer: Self-pay

## 2020-12-18 DIAGNOSIS — M6281 Muscle weakness (generalized): Secondary | ICD-10-CM | POA: Diagnosis present

## 2020-12-18 DIAGNOSIS — M25572 Pain in left ankle and joints of left foot: Secondary | ICD-10-CM | POA: Diagnosis present

## 2020-12-18 DIAGNOSIS — M25672 Stiffness of left ankle, not elsewhere classified: Secondary | ICD-10-CM | POA: Insufficient documentation

## 2020-12-18 DIAGNOSIS — R262 Difficulty in walking, not elsewhere classified: Secondary | ICD-10-CM | POA: Diagnosis present

## 2020-12-19 NOTE — Therapy (Signed)
[facility] [address] [place], [place], [address] Phone: [phone]   Fax:  [phone]  Physical Therapy Evaluation  Patient Details  Name: Melinda Padilla MRN: [id number] Date of Birth: 1990/11/03 Referring Provider (PT): Cristie HemStanbery, Mary L, [place]   Encounter Date: 12/18/2020   PT End of Session - 12/18/20 1528     Visit Number 1    Number of Visits 17    Date for PT Re-Evaluation 02/16/21    Authorization Type [employer]    PT Start Time 1528    PT Stop Time 1611    PT Time Calculation (min) 43 min    Equipment Utilized During Treatment Other (comment)   axillary crutches and cam boot   Activity Tolerance Patient tolerated treatment well    Behavior During Therapy [facility] for tasks assessed/performed             Past Medical History:  Diagnosis Date   Heart murmur     Past Surgical History:  Procedure Laterality Date   NO PAST SURGERIES     ORIF ANKLE FRACTURE Right 11/26/2020   Procedure: OPEN REDUCTION INTERNAL FIXATION (ORIF) RIGHT LATERAL MALLEOLUS;  Surgeon: Tarry KosXu, Naiping M, MD;  Location: [facility];  Service: Orthopedics;  Laterality: Right;    There were no vitals filed for this visit.    Subjective Assessment - 12/18/20 1529     Subjective Patient is s/p ORIF Rt lateral malleolus fracture on 11/26/20 from a fall while she was squatting on 10/22/20. She has been NWB since surgery and is to remain NWB until f/u with surgeon on 01/08/21. She feels that the CAM boot doesn't fit properly, so wants to look at this. She recently had doppler for DVT, which was negative.    Limitations Walking;Standing;Lifting;House hold activities    Patient Stated Goals To walk    Currently in Pain? Yes    Pain Score 4     Pain Location Ankle    Pain Orientation Right    Pain Descriptors / Indicators Aching    Pain Type Surgical pain    Pain Onset 1 to 4 weeks ago    Pain Frequency Intermittent     Aggravating Factors  the boot    Pain Relieving Factors moves until she finds a comfortable position                [facility] PT Assessment - 12/19/20 0001       Assessment   Medical Diagnosis S82.891D (ICD-10-CM) - Closed fracture of right ankle with routine healing, subsequent encounter    Referring Provider (PT) Cristie HemStanbery, Mary L, PA-C    Onset Date/Surgical Date 11/26/20    Hand Dominance Right    Next MD Visit 01/08/21    Prior Therapy no      Precautions   Precautions None      Restrictions   Weight Bearing Restrictions Yes    Other Position/Activity Restrictions NWB RLE      Balance Screen   Has the patient fallen in the past 6 months Yes    How many times? 1   slipped   Has the patient had a decrease in activity level because of a fear of falling?  No    Is the patient reluctant to leave their home because of a fear of falling?  Yes      Home Environment   Living Environment Private residence    Living Arrangements Non-relatives/Friends    Type  of Home Apartment    Additional Comments 3 steps      Prior Function   Level of Independence Needs assistance with ADLs   needs help with cooking/cleaning; not driving   Vocation Unemployed    Leisure playstation      Cognition   Overall Cognitive Status Within Functional Limits for tasks assessed      Observation/Other Assessments   Observations steri strips in place about lateral malleoli; swelling about the ankle; maintains foot in significant ER at rest    Skin Integrity dry, cracked skin about the lateral ankle    Focus on Therapeutic Outcomes (FOTO)  38% to 67%      Sensation   Light Touch Not tested      Posture/Postural Control   Posture/Postural Control No significant limitations      AROM   Right Ankle Dorsiflexion --   lacking 10   Right Ankle Plantar Flexion --   unable to elicit active movement   Right Ankle Inversion --   unable to elicit active movement   Right Ankle Eversion --   unable to elicit  active movement   Left Ankle Dorsiflexion 5    Left Ankle Plantar Flexion 70    Left Ankle Inversion 5    Left Ankle Eversion 18      PROM   Overall PROM Comments guarding with PROM, unable to accurately assess      Strength   Overall Strength Comments MMT deferred due to post-op acuity      Palpation   Palpation comment diffuse tenderness about the lateral ankle      Ambulation/Gait   Ambulation/Gait Yes    Assistive device Crutches    Gait Comments NWB RLE                        Objective measurements completed on examination: See above findings.       [facility] Adult PT Treatment/Exercise - 12/19/20 0001       Self-Care   Self-Care Other Self-Care Comments    Other Self-Care Comments  see patient education      Exercises   Exercises Ankle      Ankle Exercises: Stretches   Gastroc Stretch 30 seconds      Ankle Exercises: Seated   Ankle Circles/Pumps 10 reps    Other Seated Ankle Exercises toe flexion/extension AROM 10 reps                    PT Education - 12/19/20 0905     Education Details Education on current condition, POC, HEP, FOTO score, ice/elevation for swelling/pain control, NWB status.    Person(s) Educated Patient    Methods Explanation;Demonstration;Verbal cues;Handout    Comprehension Verbalized understanding;Returned demonstration;Verbal cues required              PT Short Term Goals - 12/18/20 1543       PT SHORT TERM GOAL #1   Title Patient will be independent with initial HEP.    Baseline issued at eval.    Status New    Target Date 01/01/21      PT SHORT TERM GOAL #2   Title Patient will demonstrate at least 5 degrees of ankle inversion/eversion AROM on the RLE.    Baseline unable    Status New    Target Date 01/15/21      PT SHORT TERM GOAL #3   Title Patient will demonstrate at least neutral  dorsiflexion to assist in normalizing gait mechanics.    Baseline lacking 10    Status New    Target Date  01/15/21      PT SHORT TERM GOAL #4   Title Patient will maintain SLS on the RLE for at least 3 seconds once cleared for weight bearing activity to improve stability with gait pattern.    Status New    Target Date 01/16/21               PT Long Term Goals - 12/18/20 1807       PT LONG TERM GOAL #1   Title Patient will demonstrate at least 4+/5 strength in Rt ankle to improve stability with walking/standing activity    Baseline MMT deferred due to post-op acuity.    Status New    Target Date 02/13/21      PT LONG TERM GOAL #2   Title Patient will demonstrate normalized gait pattern with LRAD once cleared by surgeon.    Baseline NWB RLE    Status New    Target Date 02/13/21      PT LONG TERM GOAL #3   Title Patient will maintain SLS on the RLE for at least 20 seconds to demonstrate increased ankle stability with functional activities.    Baseline unable    Status New    Target Date 02/13/21      PT LONG TERM GOAL #4   Title Patient will score at least 67% function on FOTO to signify clinically meaningful improvement in functional abilities.    Baseline 38%    Status New    Target Date 02/13/21                    Plan - 12/18/20 1544     Clinical Impression Statement Patient is a 30 y/o female who presents to OPPT s/p ORIF Rt lateral malleolar fracture on 11/26/20. She is NWB on the RLE and has been following her restrictions well utilizing axillary crutches and CAM boot. She is able to elicit minimal active dorsiflexion, though is unable to complete active ankle inversion, eversion, or plantarflexion at this time. She has swelling about the entire ankle and diffuse tenderness about the lateral aspect of the ankle/foot. She will benefit from skilled PT to first address her ROM and strength deficits and once cleared by Dr. Roda Shutters address her gait and balance deficits that are anticipated given her length of time as NWB status.    Personal Factors and Comorbidities  Fitness    Examination-Activity Limitations Stairs;Stand;Locomotion Level;Transfers;Squat;Bend;Lift;Carry    Examination-Participation Restrictions Meal Prep;Driving;Cleaning;Community Activity;Shop;Laundry    Stability/Clinical Decision Making Stable/Uncomplicated    Clinical Decision Making Low    Rehab Potential Good    PT Frequency --   1-2/week   PT Duration 8 weeks    PT Treatment/Interventions ADLs/Self Care Home Management;Cryotherapy;Electrical Stimulation;Moist Heat;Gait training;Stair training;Functional mobility training;Therapeutic activities;Therapeutic exercise;Balance training;Neuromuscular re-education;Patient/family education;Manual techniques;Passive range of motion;Dry needling;Taping;Vasopneumatic Device    PT Next Visit Plan manual to ankle, ankle AROM    PT Home Exercise Plan Access Code: [email or web address]    Consulted and Agree with Plan of Care Patient             Patient will benefit from skilled therapeutic intervention in order to improve the following deficits and impairments:  Difficulty walking, Abnormal gait, Decreased range of motion, Decreased activity tolerance, Pain, Decreased balance, Impaired flexibility, Decreased mobility, Decreased strength, Impaired sensation  Visit Diagnosis: Pain in left  ankle and joints of left foot  Stiffness of left ankle, not elsewhere classified  Difficulty in walking, not elsewhere classified  Muscle weakness (generalized)     Problem List Patient Active Problem List   Diagnosis Date Noted   Displaced fracture of lateral malleolus of right fibula, initial encounter for closed fracture    Letitia Libra, PT, DPT, ATC 12/19/20 9:19 AM   [facility] [address] [place], [place], [address] Phone: [phone]   Fax:  [phone]  Name: Rylynn Kobs MRN: [id number] Date of Birth: Nov 22, 1990

## 2020-12-25 ENCOUNTER — Ambulatory Visit: Payer: 59 | Attending: Physician Assistant

## 2020-12-25 ENCOUNTER — Other Ambulatory Visit: Payer: Self-pay

## 2020-12-25 DIAGNOSIS — M25672 Stiffness of left ankle, not elsewhere classified: Secondary | ICD-10-CM | POA: Diagnosis present

## 2020-12-25 DIAGNOSIS — R262 Difficulty in walking, not elsewhere classified: Secondary | ICD-10-CM | POA: Insufficient documentation

## 2020-12-25 DIAGNOSIS — M25572 Pain in left ankle and joints of left foot: Secondary | ICD-10-CM | POA: Insufficient documentation

## 2020-12-25 DIAGNOSIS — M6281 Muscle weakness (generalized): Secondary | ICD-10-CM | POA: Insufficient documentation

## 2020-12-25 NOTE — Patient Instructions (Signed)
  [id number] 

## 2020-12-25 NOTE — Therapy (Signed)
[facility] [address] [place], [place], [address] Phone: [phone]   Fax:  [phone]  Physical Therapy Treatment  Patient Details  Name: Melinda Padilla MRN: [id number] Date of Birth: 07/11/1990 Referring Provider (PT): Cristie Hem, [place]   Encounter Date: 12/25/2020   PT End of Session - 12/25/20 1904     Visit Number 2    Number of Visits 17    Date for PT Re-Evaluation 02/16/21    Authorization Type [employer]    PT Start Time 1830    PT Stop Time 1915    PT Time Calculation (min) 45 min    Activity Tolerance Patient tolerated treatment well    Behavior During Therapy [facility] for tasks assessed/performed             Past Medical History:  Diagnosis Date   Heart murmur     Past Surgical History:  Procedure Laterality Date   NO PAST SURGERIES     ORIF ANKLE FRACTURE Right 11/26/2020   Procedure: OPEN REDUCTION INTERNAL FIXATION (ORIF) RIGHT LATERAL MALLEOLUS;  Surgeon: Tarry Kos, MD;  Location: [facility];  Service: Orthopedics;  Laterality: Right;    There were no vitals filed for this visit.   Subjective Assessment - 12/25/20 1830     Subjective Pt reports being adherent with her HEP, performing er exercises daily. She reports moderate R ankle pain today and reports improvement since last week.    Currently in Pain? Yes    Pain Score 4     Pain Location Ankle    Pain Orientation Right    Pain Descriptors / Indicators Aching    Pain Type Surgical pain    Pain Onset More than a month ago    Pain Frequency Constant                               [facility] Adult PT Treatment/Exercise - 12/25/20 0001       Manual Therapy   Manual Therapy Joint mobilization;Passive ROM;Soft tissue mobilization    Joint Mobilization Grade 2 talocrural AP/PA, subtalar medial/lateral joint mobilization    Soft tissue mobilization Graded exposure from light touch to deep tissue  massage to R foot/ ankle    Passive ROM Gentle PROM into DF, PF, inversion, and eversion 2x5 with 5sec hold each      Ankle Exercises: Stretches   Gastroc Stretch Other (comment)   manual stretch 2x61min on R     Ankle Exercises: Seated   Heel Raises 20 reps    Toe Raise 20 reps    BAPS Level 1;Sitting;Other (comment)   2x10 CW and CCW on R                 Upper Extremity Functional Index Score :   /80   PT Education - 12/25/20 1903     Education Details Updated HEP, instructed on desensitization techniques to reduce pain    Person(s) Educated Patient    Methods Explanation;Demonstration;Verbal cues;Handout    Comprehension Verbalized understanding;Returned demonstration;Verbal cues required              PT Short Term Goals - 12/18/20 1543       PT SHORT TERM GOAL #1   Title Patient will be independent with initial HEP.    Baseline issued at eval.    Status New    Target Date 01/01/21  PT SHORT TERM GOAL #2   Title Patient will demonstrate at least 5 degrees of ankle inversion/eversion AROM on the RLE.    Baseline unable    Status New    Target Date 01/15/21      PT SHORT TERM GOAL #3   Title Patient will demonstrate at least neutral dorsiflexion to assist in normalizing gait mechanics.    Baseline lacking 10    Status New    Target Date 01/15/21      PT SHORT TERM GOAL #4   Title Patient will maintain SLS on the RLE for at least 3 seconds once cleared for weight bearing activity to improve stability with gait pattern.    Status New    Target Date 01/16/21               PT Long Term Goals - 12/18/20 1807       PT LONG TERM GOAL #1   Title Patient will demonstrate at least 4+/5 strength in Rt ankle to improve stability with walking/standing activity    Baseline MMT deferred due to post-op acuity.    Status New    Target Date 02/13/21      PT LONG TERM GOAL #2   Title Patient will demonstrate normalized gait pattern with LRAD once  cleared by surgeon.    Baseline NWB RLE    Status New    Target Date 02/13/21      PT LONG TERM GOAL #3   Title Patient will maintain SLS on the RLE for at least 20 seconds to demonstrate increased ankle stability with functional activities.    Baseline unable    Status New    Target Date 02/13/21      PT LONG TERM GOAL #4   Title Patient will score at least 67% function on FOTO to signify clinically meaningful improvement in functional abilities.    Baseline 38%    Status New    Target Date 02/13/21                   Plan - 12/25/20 1907     Clinical Impression Statement Pt is 4 weeks 1 day s/p ORIF R lateral malleolus fx on 11/26/2020. She presents with continued high levels of pain and demonstrates potential central sensitization of pain as pt is highly TTP to light touch to surgical area. Through desensitization techniques and graded exposure, the pt was able to accomplish seated ankle ROM exercises after manual therapy and joint mobilization. The pt will continue to benefit from skilled PT to address her primary impairments and return to her prior level of function without limitation.    Personal Factors and Comorbidities Fitness    Examination-Activity Limitations Stairs;Stand;Locomotion Level;Transfers;Squat;Bend;Lift;Carry    Examination-Participation Restrictions Meal Prep;Driving;Cleaning;Community Activity;Shop;Laundry    Stability/Clinical Decision Making Stable/Uncomplicated    Clinical Decision Making Low    Rehab Potential Good    PT Frequency --   1-2x/week   PT Duration 8 weeks    PT Treatment/Interventions ADLs/Self Care Home Management;Cryotherapy;Electrical Stimulation;Moist Heat;Gait training;Stair training;Functional mobility training;Therapeutic activities;Therapeutic exercise;Balance training;Neuromuscular re-education;Patient/family education;Manual techniques;Passive range of motion;Dry needling;Taping;Vasopneumatic Device    PT Next Visit Plan manual  to ankle, ankle AROM    PT Home Exercise Plan Access Code: [email or web address]    Consulted and Agree with Plan of Care Patient             Patient will benefit from skilled therapeutic intervention in order to improve the following deficits and impairments:  Difficulty walking, Abnormal gait, Decreased range of motion, Decreased activity tolerance, Pain, Decreased balance, Impaired flexibility, Decreased mobility, Decreased strength, Impaired sensation  Visit Diagnosis: Pain in left ankle and joints of left foot  Stiffness of left ankle, not elsewhere classified  Difficulty in walking, not elsewhere classified  Muscle weakness (generalized)     Problem List Patient Active Problem List   Diagnosis Date Noted   Displaced fracture of lateral malleolus of right fibula, initial encounter for closed fracture     Carmelina Dane, PT, DPT 12/25/20 7:15 PM   [facility] [address] [place], [place], [address] Phone: [phone]   Fax:  [phone]  Name: Melinda Padilla MRN: [id number] Date of Birth: [birth year]

## 2020-12-27 ENCOUNTER — Telehealth: Payer: Self-pay | Admitting: Physician Assistant

## 2020-12-27 ENCOUNTER — Ambulatory Visit: Payer: 59

## 2020-12-27 NOTE — Telephone Encounter (Signed)
Pt would like a refill on robaxin

## 2020-12-31 ENCOUNTER — Other Ambulatory Visit: Payer: Self-pay | Admitting: Physician Assistant

## 2020-12-31 MED ORDER — METHOCARBAMOL 500 MG PO TABS: 500.0000 mg | ORAL_TABLET | Freq: Two times a day (BID) | ORAL | 0 refills | Status: DC | PRN

## 2020-12-31 NOTE — Telephone Encounter (Signed)
sent 

## 2021-01-01 ENCOUNTER — Other Ambulatory Visit: Payer: Self-pay

## 2021-01-01 ENCOUNTER — Ambulatory Visit: Payer: 59

## 2021-01-01 DIAGNOSIS — M25572 Pain in left ankle and joints of left foot: Secondary | ICD-10-CM

## 2021-01-01 DIAGNOSIS — M25672 Stiffness of left ankle, not elsewhere classified: Secondary | ICD-10-CM

## 2021-01-01 DIAGNOSIS — R262 Difficulty in walking, not elsewhere classified: Secondary | ICD-10-CM

## 2021-01-01 DIAGNOSIS — M6281 Muscle weakness (generalized): Secondary | ICD-10-CM

## 2021-01-01 NOTE — Patient Instructions (Signed)
  [id number] 

## 2021-01-01 NOTE — Therapy (Signed)
[facility] [address] [place], [place], [address] Phone: [phone]   Fax:  [phone]  Physical Therapy Treatment  Patient Details  Name: Melinda Padilla MRN: [id number] Date of Birth: Nov 11, 1990 Referring Provider (PT): Cristie Hem, [place]   Encounter Date: 01/01/2021   PT End of Session - 01/01/21 1910     Visit Number 3    Number of Visits 17    Date for PT Re-Evaluation 02/16/21    Authorization Type [employer]    PT Start Time 1830    PT Stop Time 1920   of vasopneumatic   PT Time Calculation (min) 50 min    Activity Tolerance Patient tolerated treatment well;Patient limited by pain    Behavior During Therapy [facility] for tasks assessed/performed             Past Medical History:  Diagnosis Date   Heart murmur     Past Surgical History:  Procedure Laterality Date   NO PAST SURGERIES     ORIF ANKLE FRACTURE Right 11/26/2020   Procedure: OPEN REDUCTION INTERNAL FIXATION (ORIF) RIGHT LATERAL MALLEOLUS;  Surgeon: Tarry Kos, MD;  Location: [facility];  Service: Orthopedics;  Laterality: Right;    There were no vitals filed for this visit.   Subjective Assessment - 01/01/21 1824     Subjective Pt reports she has noticed achieving more motion in her ankle since her last visit, adding that she has continued to perform her HEP daily. She states that she is in increased discomfort today due to readjusting her foot in her boot. Her upcoming orthopedic appointment is schedule for 01/08/2021.    Currently in Pain? Yes    Pain Score 6     Pain Location Ankle    Pain Orientation Right    Pain Descriptors / Indicators Aching    Pain Type Surgical pain                               [facility] Adult PT Treatment/Exercise - 01/01/21 0001       Neuro Re-ed    Neuro Re-ed Details  mirror therapy with seated heel/toe raises 3x15      Modalities   Modalities Vasopneumatic       Vasopneumatic   Number Minutes Vasopneumatic  10 minutes    Vasopnuematic Location  Ankle   R   Vasopneumatic Pressure Medium    Vasopneumatic Temperature  34      Ankle Exercises: Stretches   Gastroc Stretch 2 reps;60 seconds;Other (comment)   seated with heel on stool using strap     Ankle Exercises: Seated   Towel Crunch 5 reps;Other (comment)   R inversion and eversion   BAPS Level 1;Sitting;Other (comment)   2x10 CW and CCW on R                    PT Education - 01/01/21 1910     Education Details Updated HEP    Person(s) Educated Patient    Methods Explanation;Demonstration;Verbal cues;Handout    Comprehension Returned demonstration;Verbalized understanding;Verbal cues required              PT Short Term Goals - 12/18/20 1543       PT SHORT TERM GOAL #1   Title Patient will be independent with initial HEP.    Baseline issued at eval.    Status New  Target Date 01/01/21      PT SHORT TERM GOAL #2   Title Patient will demonstrate at least 5 degrees of ankle inversion/eversion AROM on the RLE.    Baseline unable    Status New    Target Date 01/15/21      PT SHORT TERM GOAL #3   Title Patient will demonstrate at least neutral dorsiflexion to assist in normalizing gait mechanics.    Baseline lacking 10    Status New    Target Date 01/15/21      PT SHORT TERM GOAL #4   Title Patient will maintain SLS on the RLE for at least 3 seconds once cleared for weight bearing activity to improve stability with gait pattern.    Status New    Target Date 01/16/21               PT Long Term Goals - 12/18/20 1807       PT LONG TERM GOAL #1   Title Patient will demonstrate at least 4+/5 strength in Rt ankle to improve stability with walking/standing activity    Baseline MMT deferred due to post-op acuity.    Status New    Target Date 02/13/21      PT LONG TERM GOAL #2   Title Patient will demonstrate normalized gait pattern with LRAD once  cleared by surgeon.    Baseline NWB RLE    Status New    Target Date 02/13/21      PT LONG TERM GOAL #3   Title Patient will maintain SLS on the RLE for at least 20 seconds to demonstrate increased ankle stability with functional activities.    Baseline unable    Status New    Target Date 02/13/21      PT LONG TERM GOAL #4   Title Patient will score at least 67% function on FOTO to signify clinically meaningful improvement in functional abilities.    Baseline 38%    Status New    Target Date 02/13/21                   Plan - 01/01/21 1911     Clinical Impression Statement Pt is 5 weeks 1 day s/p ORIF R lateral malleolus fx on 11/26/2020. By utilizing mirror therapy, the pt was able to achieve improved R ankle AROM, which carried over into [employer] training. The pt reports decrease in pain to 4/10 following vasopneumatic treatment. The pt will continue to benefit from skilled PT to address her primary impairments and return to her prior level of function without limitation.    Personal Factors and Comorbidities Fitness    Examination-Activity Limitations Stairs;Stand;Locomotion Level;Transfers;Squat;Bend;Lift;Carry    Examination-Participation Restrictions Meal Prep;Driving;Cleaning;Community Activity;Shop;Laundry    Stability/Clinical Decision Making Stable/Uncomplicated    Clinical Decision Making Low    Rehab Potential Good    PT Frequency --   1-2x/week   PT Duration 8 weeks    PT Treatment/Interventions ADLs/Self Care Home Management;Cryotherapy;Electrical Stimulation;Moist Heat;Gait training;Stair training;Functional mobility training;Therapeutic activities;Therapeutic exercise;Balance training;Neuromuscular re-education;Patient/family education;Manual techniques;Passive range of motion;Dry needling;Taping;Vasopneumatic Device    PT Next Visit Plan manual to ankle, ankle AROM    PT Home Exercise Plan Access Code: [email or web address]    Consulted and Agree with Plan of Care Patient              Patient will benefit from skilled therapeutic intervention in order to improve the following deficits and impairments:  Difficulty walking, Abnormal gait, Decreased range of  motion, Decreased activity tolerance, Pain, Decreased balance, Impaired flexibility, Decreased mobility, Decreased strength, Impaired sensation  Visit Diagnosis: Pain in left ankle and joints of left foot  Stiffness of left ankle, not elsewhere classified  Difficulty in walking, not elsewhere classified  Muscle weakness (generalized)     Problem List Patient Active Problem List   Diagnosis Date Noted   Displaced fracture of lateral malleolus of right fibula, initial encounter for closed fracture     Carmelina Dane, PT, DPT 01/01/21 7:14 PM   [facility] [address] [place], [place], [address] Phone: [phone]   Fax:  [phone]  Name: Melinda Padilla MRN: [id number] Date of Birth: 1990/08/09

## 2021-01-03 ENCOUNTER — Ambulatory Visit: Payer: 59

## 2021-01-03 ENCOUNTER — Other Ambulatory Visit: Payer: Self-pay

## 2021-01-03 DIAGNOSIS — M25572 Pain in left ankle and joints of left foot: Secondary | ICD-10-CM

## 2021-01-03 DIAGNOSIS — M25672 Stiffness of left ankle, not elsewhere classified: Secondary | ICD-10-CM

## 2021-01-03 DIAGNOSIS — M6281 Muscle weakness (generalized): Secondary | ICD-10-CM

## 2021-01-03 DIAGNOSIS — R262 Difficulty in walking, not elsewhere classified: Secondary | ICD-10-CM

## 2021-01-03 NOTE — Patient Instructions (Signed)
  [id number]

## 2021-01-03 NOTE — Therapy (Signed)
[facility] Outpatient Rehabilitation [facility] [address] [place], [place], [address] Phone: [phone]   Fax:  [phone]  Physical Therapy Treatment  Patient Details  Name: Melinda Padilla MRN: [id number] Date of Birth: 06/28/1990 Referring Provider (PT): Cristie Hem, [place]   Encounter Date: 01/03/2021   PT End of Session - 01/03/21 1814     Visit Number 4    Number of Visits 17    Date for PT Re-Evaluation 02/16/21    Authorization Type [employer]    PT Start Time 1745    PT Stop Time 1840   10 minutes of vasopneumatic   PT Time Calculation (min) 55 min    Activity Tolerance Patient tolerated treatment well;Patient limited by pain    Behavior During Therapy [facility] for tasks assessed/performed             Past Medical History:  Diagnosis Date   Heart murmur     Past Surgical History:  Procedure Laterality Date   NO PAST SURGERIES     ORIF ANKLE FRACTURE Right 11/26/2020   Procedure: OPEN REDUCTION INTERNAL FIXATION (ORIF) RIGHT LATERAL MALLEOLUS;  Surgeon: Tarry Kos, MD;  Location:  [facility];  Service: Orthopedics;  Laterality: Right;    There were no vitals filed for this visit.   Subjective Assessment - 01/03/21 1749     Subjective Pt reports continued pain and new onset of "pulling pain" in her R Achilles' tendon which is random and not associated with any particular activity. She reports not doing her stretches, only performing the towel scrunches for her HEP.    Currently in Pain? Yes    Pain Score 6     Pain Location Ankle    Pain Orientation Right    Pain Descriptors / Indicators Aching    Pain Type Surgical pain                               [facility] Adult PT Treatment/Exercise - 01/03/21 0001       Modalities   Modalities Vasopneumatic      Vasopneumatic   Number Minutes Vasopneumatic  10 minutes    Vasopnuematic Location  Ankle    Vasopneumatic Pressure Medium     Vasopneumatic Temperature  34      Manual Therapy   Manual Therapy Joint mobilization;Passive ROM    Joint Mobilization Grade 2 talocrural AP/PA, subtalar medial/lateral joint mobilization    Passive ROM AAROM into PF, DF, eversion, and eversion; transitioning into ankle rolling AAROM      Ankle Exercises: Stretches   Gastroc Stretch 2 reps;60 seconds;Other (comment)      Ankle Exercises: Aerobic   Other Aerobic Prone hamstring curls with 2# ankle weights 2x10 BIL      Ankle Exercises: Seated   Heel Raises Other (comment)   3x10 with handhold resistance   Toe Raise Other (comment)   3x10   Other Seated Ankle Exercises toe flexion/extension AROM 3x10                     PT Education - 01/03/21 1813     Education Details Updated HEP    Person(s) Educated Patient    Methods Explanation;Demonstration;Handout    Comprehension Verbalized understanding;Returned demonstration              PT Short Term Goals - 12/18/20 1543       PT SHORT TERM  GOAL #1   Title Patient will be independent with initial HEP.    Baseline issued at eval.    Status New    Target Date 01/01/21      PT SHORT TERM GOAL #2   Title Patient will demonstrate at least 5 degrees of ankle inversion/eversion AROM on the RLE.    Baseline unable    Status New    Target Date 01/15/21      PT SHORT TERM GOAL #3   Title Patient will demonstrate at least neutral dorsiflexion to assist in normalizing gait mechanics.    Baseline lacking 10    Status New    Target Date 01/15/21      PT SHORT TERM GOAL #4   Title Patient will maintain SLS on the RLE for at least 3 seconds once cleared for weight bearing activity to improve stability with gait pattern.    Status New    Target Date 01/16/21               PT Long Term Goals - 12/18/20 1807       PT LONG TERM GOAL #1   Title Patient will demonstrate at least 4+/5 strength in Rt ankle to improve stability with walking/standing activity     Baseline MMT deferred due to post-op acuity.    Status New    Target Date 02/13/21      PT LONG TERM GOAL #2   Title Patient will demonstrate normalized gait pattern with LRAD once cleared by surgeon.    Baseline NWB RLE    Status New    Target Date 02/13/21      PT LONG TERM GOAL #3   Title Patient will maintain SLS on the RLE for at least 20 seconds to demonstrate increased ankle stability with functional activities.    Baseline unable    Status New    Target Date 02/13/21      PT LONG TERM GOAL #4   Title Patient will score at least 67% function on FOTO to signify clinically meaningful improvement in functional abilities.    Baseline 38%    Status New    Target Date 02/13/21                   Plan - 01/03/21 1817     Clinical Impression Statement Pt is 5 weeks 3 days s/p ORIF R lateral malleolus fx on 11/26/2020. She responded well to all interventions today, demonstrating improved ankle ROM following AAROM and no increase in pain with selected exercises. Following vasopneumatic treatment, the pt reports 2/10 pain. The pt will continue to benefit from skilled PT to address her primary impairments and return to her prior level of function without limitation.    Personal Factors and Comorbidities Fitness    Examination-Activity Limitations Stairs;Stand;Locomotion Level;Transfers;Squat;Bend;Lift;Carry    Examination-Participation Restrictions Meal Prep;Driving;Cleaning;Community Activity;Shop;Laundry    Stability/Clinical Decision Making Stable/Uncomplicated    Clinical Decision Making Low    Rehab Potential Good    PT Frequency --   1-2x/week   PT Duration 8 weeks    PT Treatment/Interventions ADLs/Self Care Home Management;Cryotherapy;Electrical Stimulation;Moist Heat;Gait training;Stair training;Functional mobility training;Therapeutic activities;Therapeutic exercise;Balance training;Neuromuscular re-education;Patient/family education;Manual techniques;Passive range of  motion;Dry needling;Taping;Vasopneumatic Device    PT Next Visit Plan manual to ankle, ankle AROM, vasopneumatic PRN    PT Home Exercise Plan Access Code: [email or web address]    Consulted and Agree with Plan of Care Patient  Patient will benefit from skilled therapeutic intervention in order to improve the following deficits and impairments:  Difficulty walking, Abnormal gait, Decreased range of motion, Decreased activity tolerance, Pain, Decreased balance, Impaired flexibility, Decreased mobility, Decreased strength, Impaired sensation  Visit Diagnosis: Pain in left ankle and joints of left foot  Stiffness of left ankle, not elsewhere classified  Difficulty in walking, not elsewhere classified  Muscle weakness (generalized)     Problem List Patient Active Problem List   Diagnosis Date Noted   Displaced fracture of lateral malleolus of right fibula, initial encounter for closed fracture     Carmelina Dane, PT, DPT 01/03/21 6:29 PM   [facility] [address] [place], [place], [address] Phone: [phone]   Fax:  [phone]  Name: Melinda Padilla MRN: [id number] Date of Birth: 03-Apr-1991

## 2021-01-08 ENCOUNTER — Other Ambulatory Visit: Payer: Self-pay

## 2021-01-08 ENCOUNTER — Ambulatory Visit (INDEPENDENT_AMBULATORY_CARE_PROVIDER_SITE_OTHER): Payer: 59

## 2021-01-08 ENCOUNTER — Ambulatory Visit: Payer: 59

## 2021-01-08 ENCOUNTER — Ambulatory Visit (INDEPENDENT_AMBULATORY_CARE_PROVIDER_SITE_OTHER): Payer: 59 | Admitting: Orthopaedic Surgery

## 2021-01-08 ENCOUNTER — Encounter: Payer: Self-pay | Admitting: Orthopaedic Surgery

## 2021-01-08 DIAGNOSIS — M25672 Stiffness of left ankle, not elsewhere classified: Secondary | ICD-10-CM

## 2021-01-08 DIAGNOSIS — S82891D Other fracture of right lower leg, subsequent encounter for closed fracture with routine healing: Secondary | ICD-10-CM

## 2021-01-08 DIAGNOSIS — R262 Difficulty in walking, not elsewhere classified: Secondary | ICD-10-CM

## 2021-01-08 DIAGNOSIS — M25572 Pain in left ankle and joints of left foot: Secondary | ICD-10-CM

## 2021-01-08 DIAGNOSIS — M6281 Muscle weakness (generalized): Secondary | ICD-10-CM

## 2021-01-08 MED ORDER — HYDROCODONE-ACETAMINOPHEN 5-325 MG PO TABS: 1.0000 | ORAL_TABLET | Freq: Three times a day (TID) | ORAL | 0 refills | Status: DC | PRN

## 2021-01-08 NOTE — Therapy (Signed)
[facility] Outpatient Rehabilitation [facility] [address] [place], [place], [address] Phone: [phone]   Fax:  [phone]  Physical Therapy Treatment  Patient Details  Name: Melinda Padilla MRN: [id number] Date of Birth: 06-Feb-1991 Referring Provider (PT): Cristie Hem, [place]   Encounter Date: 01/08/2021   PT End of Session - 01/08/21 1705     Visit Number 5    Number of Visits 17    Date for PT Re-Evaluation 02/16/21    Authorization Type [employer]    PT Start Time 1705    PT Stop Time 1755   game ready 10 minutes at end of session   PT Time Calculation (min) 50 min    Equipment Utilized During Treatment Other (comment)   crutches and cam boot   Activity Tolerance Patient tolerated treatment well    Behavior During Therapy [facility] for tasks assessed/performed             Past Medical History:  Diagnosis Date   Heart murmur     Past Surgical History:  Procedure Laterality Date   NO PAST SURGERIES     ORIF ANKLE FRACTURE Right 11/26/2020   Procedure: OPEN REDUCTION INTERNAL FIXATION (ORIF) RIGHT LATERAL MALLEOLUS;  Surgeon: Tarry Kos, MD;  Location: [facility];  Service: Orthopedics;  Laterality: Right;    There were no vitals filed for this visit.   Subjective Assessment - 01/08/21 1706     Subjective Patient had f/u with surgeon today who has instructed that she may begin weightbearing as tolerated in the cam walker.  She may transition to an ASO brace when therapy feels she is ready. She reports the pain is improving, but it still hurts.    Currently in Pain? Yes    Pain Score 4     Pain Location Ankle    Pain Orientation Right    Pain Descriptors / Indicators Aching    Pain Type Surgical pain    Pain Onset More than a month ago    Pain Frequency Constant    Aggravating Factors  prolonged positioning, spasm    Pain Relieving Factors ice, elevation, and pain medication                [facility] PT Assessment  - 01/08/21 0001       Restrictions   Weight Bearing Restrictions Yes    Other Position/Activity Restrictions WBAT in CAM boot      AROM   Right Ankle Eversion 20                           [facility] Adult PT Treatment/Exercise - 01/08/21 0001       Ambulation/Gait   Ambulation/Gait Yes    Ambulation Distance (Feet) 75 Feet    Assistive device Crutches    Gait Pattern Step-to pattern    Stairs Yes    Stairs Assistance 6: Modified independent (Device/Increase time)    Stair Management Technique Step to pattern   focusing on appropriate pattern with crutches   Number of Stairs 6    Gait Comments WBAT RLE, focusing on appropriate pattern with crutches      Self-Care   Other Self-Care Comments  see patient education      Vasopneumatic   Number Minutes Vasopneumatic  10 minutes    Vasopnuematic Location  Ankle    Vasopneumatic Pressure Low    Vasopneumatic Temperature  34  Manual Therapy   Soft tissue mobilization gentle STM to plantar aspect of foot    Passive ROM PROM in all planes to tolerance                     PT Education - 01/08/21 1724     Education Details issued tennis ball for self soft tissue mobilization of plantar aspect of foot. issued handout providing further info on appropriate gait pattern with stair negotation.    Person(s) Educated Patient    Methods Explanation;Demonstration;Verbal cues;Handout    Comprehension Verbalized understanding;Returned demonstration;Verbal cues required              PT Short Term Goals - 12/18/20 1543       PT SHORT TERM GOAL #1   Title Patient will be independent with initial HEP.    Baseline issued at eval.    Status New    Target Date 01/01/21      PT SHORT TERM GOAL #2   Title Patient will demonstrate at least 5 degrees of ankle inversion/eversion AROM on the RLE.    Baseline unable    Status New    Target Date 01/15/21      PT SHORT TERM GOAL #3   Title Patient will  demonstrate at least neutral dorsiflexion to assist in normalizing gait mechanics.    Baseline lacking 10    Status New    Target Date 01/15/21      PT SHORT TERM GOAL #4   Title Patient will maintain SLS on the RLE for at least 3 seconds once cleared for weight bearing activity to improve stability with gait pattern.    Status New    Target Date 01/16/21               PT Long Term Goals - 12/18/20 1807       PT LONG TERM GOAL #1   Title Patient will demonstrate at least 4+/5 strength in Rt ankle to improve stability with walking/standing activity    Baseline MMT deferred due to post-op acuity.    Status New    Target Date 02/13/21      PT LONG TERM GOAL #2   Title Patient will demonstrate normalized gait pattern with LRAD once cleared by surgeon.    Baseline NWB RLE    Status New    Target Date 02/13/21      PT LONG TERM GOAL #3   Title Patient will maintain SLS on the RLE for at least 20 seconds to demonstrate increased ankle stability with functional activities.    Baseline unable    Status New    Target Date 02/13/21      PT LONG TERM GOAL #4   Title Patient will score at least 67% function on FOTO to signify clinically meaningful improvement in functional abilities.    Baseline 38%    Status New    Target Date 02/13/21                   Plan - 01/08/21 1711     Clinical Impression Statement Patient was cleared by surgeon today to begin WBAT in the CAM boot and progress to WBAT in ASO when therapy deems appropriate. She remains guarded with manual therapy with significant spasming in her toes with PROM. Issued tennis ball for self soft tissue mobilization as she has significant tautness and palpable tenderness about the plantar aspect of the foot. Began gait training allowing weight onto  the RLE with patient able to complete step to gait pattern with PWB on the RLE. Completed stair negotation with patient able to complete step to pattern with PWB on the  RLE, though required consistent cues for appropriate patterning with the crutches.    Personal Factors and Comorbidities Fitness    Examination-Activity Limitations Stairs;Stand;Locomotion Level;Transfers;Squat;Bend;Lift;Carry    Examination-Participation Restrictions Meal Prep;Driving;Cleaning;Community Activity;Shop;Laundry    Stability/Clinical Decision Making Stable/Uncomplicated    Rehab Potential Good    PT Frequency --   1-2x/week   PT Duration 8 weeks    PT Treatment/Interventions ADLs/Self Care Home Management;Cryotherapy;Electrical Stimulation;Moist Heat;Gait training;Stair training;Functional mobility training;Therapeutic activities;Therapeutic exercise;Balance training;Neuromuscular re-education;Patient/family education;Manual techniques;Passive range of motion;Dry needling;Taping;Vasopneumatic Device    PT Next Visit Plan manual to ankle, ankle AROM, vasopneumatic PRN    PT Home Exercise Plan Access Code: [email or web address]    Consulted and Agree with Plan of Care Patient             Patient will benefit from skilled therapeutic intervention in order to improve the following deficits and impairments:  Difficulty walking, Abnormal gait, Decreased range of motion, Decreased activity tolerance, Pain, Decreased balance, Impaired flexibility, Decreased mobility, Decreased strength, Impaired sensation  Visit Diagnosis: Pain in left ankle and joints of left foot  Stiffness of left ankle, not elsewhere classified  Difficulty in walking, not elsewhere classified  Muscle weakness (generalized)     Problem List Patient Active Problem List   Diagnosis Date Noted   Displaced fracture of lateral malleolus of right fibula, initial encounter for closed fracture    Letitia Libra, PT, DPT, ATC 01/08/21 5:53 PM  [facility] Out[facility] [address] [place], [place], [address] Phone: [phone]   Fax:  [phone]  Name: Melinda Padilla MRN:  [id number] Date of Birth: Jun 24, 1990

## 2021-01-08 NOTE — Progress Notes (Signed)
   Post-Op Visit Note   Patient: Melinda Padilla           Date of Birth: 1990-06-06           MRN: [id number] Visit Date: 01/08/2021 PCP: Patient, No Pcp Per (Inactive)   Assessment & Plan:  Chief Complaint:  Chief Complaint  Patient presents with   Right Ankle - Post-op Follow-up   Visit Diagnoses:  1. Closed fracture of right ankle with routine healing, subsequent encounter     Plan: Patient is a pleasant 30 year old female who comes in today 6 weeks status post ORIF right lateral malleolus ankle fracture 11/26/2020.  She has been compliant nonweightbearing in a cam walker to the right lower extremity.  She has been in physical therapy twice a week.  She has been taking oxycodone and tramadol for pain.  She does not notice relief with tramadol.  Examination of the right ankle reveals a well-healed surgical incision.  Calf soft nontender.  She has some mild to moderate swelling of the right ankle.  She is neurovascular intact distally.  At this point, she may begin weightbearing as tolerated in the cam walker.  She may transition to an ASO brace when therapy feels she is ready.  She already has 1 at home.  She will follow-up with [place] in 6 weeks time for repeat evaluation and three-view x-rays of the right ankle.  I called and Norco to take as needed for pain.  Call with concerns or questions in the meantime.  Follow-Up Instructions: Return in about 6 weeks (around 02/19/2021).   Orders:  Orders Placed This Encounter  Procedures   XR Ankle Complete Right   Meds ordered this encounter  Medications   HYDROcodone-acetaminophen (NORCO) 5-325 MG tablet    Sig: Take 1 tablet by mouth 3 (three) times daily as needed.    Dispense:  30 tablet    Refill:  0     Imaging: XR Ankle Complete Right  Result Date: 01/08/2021 X-rays demonstrate stable fixation of the fracture site without hardware complication.  There is slight evidence of bony consolidation   PMFS History: Patient Active  Problem List   Diagnosis Date Noted   Displaced fracture of lateral malleolus of right fibula, initial encounter for closed fracture    Past Medical History:  Diagnosis Date   Heart murmur     History reviewed. No pertinent family history.  Past Surgical History:  Procedure Laterality Date   NO PAST SURGERIES     ORIF ANKLE FRACTURE Right 11/26/2020   Procedure: OPEN REDUCTION INTERNAL FIXATION (ORIF) RIGHT LATERAL MALLEOLUS;  Surgeon: Tarry Kos, MD;  Location: [facility];  Service: Orthopedics;  Laterality: Right;   Social History   Occupational History   Not on file  Tobacco Use   Smoking status: Former    Packs/day: 1.00    Types: Cigarettes    Quit date: 08/19/2020    Years since quitting: 0.3   Smokeless tobacco: Never  Vaping Use   Vaping Use: Some days  Substance and Sexual Activity   Alcohol use: No   Drug use: Yes    Types: Marijuana   Sexual activity: Never    Birth control/protection: None

## 2021-01-10 ENCOUNTER — Ambulatory Visit: Payer: 59

## 2021-01-15 ENCOUNTER — Other Ambulatory Visit: Payer: Self-pay

## 2021-01-15 ENCOUNTER — Ambulatory Visit: Payer: 59

## 2021-01-15 DIAGNOSIS — R262 Difficulty in walking, not elsewhere classified: Secondary | ICD-10-CM

## 2021-01-15 DIAGNOSIS — M25672 Stiffness of left ankle, not elsewhere classified: Secondary | ICD-10-CM

## 2021-01-15 DIAGNOSIS — M25572 Pain in left ankle and joints of left foot: Secondary | ICD-10-CM | POA: Diagnosis not present

## 2021-01-15 DIAGNOSIS — M6281 Muscle weakness (generalized): Secondary | ICD-10-CM

## 2021-01-15 NOTE — Therapy (Signed)
[facility] Rehabilitation [facility] [address] [place], [place], [address] Phone: [phone]   Fax:  [phone]  Physical Therapy Treatment  Patient Details  Name: Melinda Padilla MRN: [id number] Date of Birth: 1990-12-06 Referring Provider (PT): Cristie Hem, [place]   Encounter Date: 01/15/2021   PT End of Session - 01/15/21 1803     Visit Number 6    Number of Visits 17    Date for PT Re-Evaluation 02/16/21    Authorization Type [employer]    PT Start Time 1745    PT Stop Time 1835   10 minutes vasopneumatic   PT Time Calculation (min) 50 min    Equipment Utilized During Treatment Other (comment)   parallel bars   Activity Tolerance Patient tolerated treatment well    Behavior During Therapy [facility] for tasks assessed/performed             Past Medical History:  Diagnosis Date   Heart murmur     Past Surgical History:  Procedure Laterality Date   NO PAST SURGERIES     ORIF ANKLE FRACTURE Right 11/26/2020   Procedure: OPEN REDUCTION INTERNAL FIXATION (ORIF) RIGHT LATERAL MALLEOLUS;  Surgeon: Tarry Kos, MD;  Location: [facility];  Service: Orthopedics;  Laterality: Right;    There were no vitals filed for this visit.   Subjective Assessment - 01/15/21 1743     Subjective Pt reports that her R ankle is feeling "okay" today. She states her pain has been manageable, although she reports tripping when walking on the sidewalk Sunday resulting in her hitting her foot on the sidewalk, which increased her pain. She reports her pain has gradually improved since that time. She reports doing her HEP daily. She adds that she has a headache today.    Currently in Pain? Yes    Pain Score 2     Pain Location Ankle    Pain Orientation Right    Pain Descriptors / Indicators Aching    Pain Type Surgical pain    Pain Onset More than a month ago    Pain Frequency Constant                [facility] PT Assessment - 01/15/21  0001       Observation/Other Assessments   Focus on Therapeutic Outcomes (FOTO)  48%      AROM   Right Ankle Dorsiflexion -5    Right Ankle Plantar Flexion 35    Right Ankle Inversion 8    Right Ankle Eversion 6                           [facility] Adult PT Treatment/Exercise - 01/15/21 0001       Modalities   Modalities Vasopneumatic      Vasopneumatic   Number Minutes Vasopneumatic  10 minutes    Vasopnuematic Location  Ankle    Vasopneumatic Pressure Low    Vasopneumatic Temperature  34      Ankle Exercises: Seated   BAPS Level 2;Sitting   2x10 CW and CCW on R     Ankle Exercises: Standing   Side Shuffle (Round Trip) --    Other Standing Ankle Exercises Side-to-side and forward to back weight shifting in // bars 2x10 each    Other Standing Ankle Exercises Tandem stance in // bars 3x30sec  PT Education - 01/15/21 1825     Education Details Updated HEP; discussed objective improvements made thus far in PT    Person(s) Educated Patient    Methods Explanation;Demonstration;Handout    Comprehension Verbalized understanding;Returned demonstration              PT Short Term Goals - 01/15/21 1827       PT SHORT TERM GOAL #1   Title Patient will be independent with initial HEP.    Baseline Pt reports adherence to her HEP    Status Achieved    Target Date 01/01/21      PT SHORT TERM GOAL #2   Title Patient will demonstrate at least 5 degrees of ankle inversion/eversion AROM on the RLE.    Baseline 8d inversion, 6d eversion (01/15/2021)    Status Achieved    Target Date 01/15/21      PT SHORT TERM GOAL #3   Title Patient will demonstrate at least neutral dorsiflexion to assist in normalizing gait mechanics.    Baseline lacking 10; lacking 5d (01/15/2021)    Status On-going    Target Date 01/15/21      PT SHORT TERM GOAL #4   Title Patient will maintain SLS on the RLE for at least 3 seconds once cleared for weight  bearing activity to improve stability with gait pattern.    Status On-going    Target Date 01/16/21               PT Long Term Goals - 01/15/21 1828       PT LONG TERM GOAL #1   Title Patient will demonstrate at least 4+/5 strength in Rt ankle to improve stability with walking/standing activity    Baseline MMT deferred due to post-op acuity.    Status On-going      PT LONG TERM GOAL #2   Title Patient will demonstrate normalized gait pattern with LRAD once cleared by surgeon.    Baseline NWB RLE    Status On-going      PT LONG TERM GOAL #3   Title Patient will maintain SLS on the RLE for at least 20 seconds to demonstrate increased ankle stability with functional activities.    Baseline unable    Status On-going      PT LONG TERM GOAL #4   Title Patient will score at least 67% function on FOTO to signify clinically meaningful improvement in functional abilities.    Baseline 38%; 48% (01/15/2021)    Status On-going                   Plan - 01/15/21 1807     Clinical Impression Statement The pt responded well to all interventions today, demonstrating good form and minor increase in pain to 3/10 with weightbearing exercises. She demonstrates improved control with weight shifting while wearing her CAM boot. Her pain decreases to 0/10 following vasopneumatic treatment. Upon reassessment of objective measures, the pt has achieved improved R ankle AROm in all planes, in addition to improved FOTO score. She will continue to benefit from skilled PT to address her primary impairments and return to her prior level of function without limitation.    Personal Factors and Comorbidities Fitness    Examination-Activity Limitations Stairs;Stand;Locomotion Level;Transfers;Squat;Bend;Lift;Carry    Examination-Participation Restrictions Meal Prep;Driving;Cleaning;Community Activity;Shop;Laundry    Stability/Clinical Decision Making Stable/Uncomplicated    Clinical Decision Making Low     Rehab Potential Good    PT Frequency --   1-2x/week  PT Duration 8 weeks    PT Treatment/Interventions ADLs/Self Care Home Management;Cryotherapy;Electrical Stimulation;Moist Heat;Gait training;Stair training;Functional mobility training;Therapeutic activities;Therapeutic exercise;Balance training;Neuromuscular re-education;Patient/family education;Manual techniques;Passive range of motion;Dry needling;Taping;Vasopneumatic Device    PT Next Visit Plan manual to ankle, ankle AROM, vasopneumatic PRN    PT Home Exercise Plan Access Code: [email or web address]    Consulted and Agree with Plan of Care Patient             Patient will benefit from skilled therapeutic intervention in order to improve the following deficits and impairments:  Difficulty walking, Abnormal gait, Decreased range of motion, Decreased activity tolerance, Pain, Decreased balance, Impaired flexibility, Decreased mobility, Decreased strength, Impaired sensation  Visit Diagnosis: Pain in left ankle and joints of left foot  Stiffness of left ankle, not elsewhere classified  Difficulty in walking, not elsewhere classified  Muscle weakness (generalized)     Problem List Patient Active Problem List   Diagnosis Date Noted   Displaced fracture of lateral malleolus of right fibula, initial encounter for closed fracture     Carmelina Dane, PT, DPT 01/15/21 6:33 PM   [facility] [address] [place], [place], [address] Phone: [phone]   Fax:  [phone]  Name: Melinda Padilla MRN: [id number] Date of Birth: [birth year]

## 2021-01-15 NOTE — Patient Instructions (Signed)
  [id number] 

## 2021-01-17 ENCOUNTER — Other Ambulatory Visit: Payer: Self-pay

## 2021-01-17 ENCOUNTER — Ambulatory Visit: Payer: 59

## 2021-01-17 DIAGNOSIS — M6281 Muscle weakness (generalized): Secondary | ICD-10-CM

## 2021-01-17 DIAGNOSIS — M25672 Stiffness of left ankle, not elsewhere classified: Secondary | ICD-10-CM

## 2021-01-17 DIAGNOSIS — M25572 Pain in left ankle and joints of left foot: Secondary | ICD-10-CM

## 2021-01-17 DIAGNOSIS — R262 Difficulty in walking, not elsewhere classified: Secondary | ICD-10-CM

## 2021-01-17 NOTE — Therapy (Signed)
[facility] Outpatient Rehabilitation [facility] [address] [place], [place], [address] Phone: [phone]   Fax:  [phone]  Physical Therapy Treatment  Patient Details  Name: Melinda Padilla MRN: [id number] Date of Birth: 06-Dec-1990 Referring Provider (PT): Cristie Hem, [place]   Encounter Date: 01/17/2021   PT End of Session - 01/17/21 1810     Visit Number 7    Number of Visits 17    Date for PT Re-Evaluation 02/16/21    Authorization Type [employer]    PT Start Time 1745    PT Stop Time 1840   10 minutes cryotherapy   PT Time Calculation (min) 55 min    Activity Tolerance Patient tolerated treatment well    Behavior During Therapy [facility] for tasks assessed/performed             Past Medical History:  Diagnosis Date   Heart murmur     Past Surgical History:  Procedure Laterality Date   NO PAST SURGERIES     ORIF ANKLE FRACTURE Right 11/26/2020   Procedure: OPEN REDUCTION INTERNAL FIXATION (ORIF) RIGHT LATERAL MALLEOLUS;  Surgeon: Tarry Kos, MD;  Location: [facility];  Service: Orthopedics;  Laterality: Right;    There were no vitals filed for this visit.   Subjective Assessment - 01/17/21 1748     Subjective Pt reports feeling tired today, stating that she walked 3 hours while shopping with her friend yesterday. She reports doing her exercises daily, although not all of the reps due to increased pain.    Currently in Pain? Yes    Pain Score 4     Pain Location Ankle    Pain Orientation Right    Pain Descriptors / Indicators Aching    Pain Type Surgical pain                               [facility] Adult PT Treatment/Exercise - 01/17/21 0001       Ambulation/Gait   Ambulation/Gait Yes    Ambulation Distance (Feet) 120 Feet    Assistive device Parallel bars    Gait Pattern Step-through pattern    Gait Comments WBAT RLE, focusing on appropriate pattern while off-loaded as tolerated through //  bars; pt reports being able to tolerate 50% weight-bearing through UE during stance on R      Modalities   Modalities Cryotherapy      Cryotherapy   Number Minutes Cryotherapy 10 Minutes    Cryotherapy Location Ankle   R   Type of Cryotherapy Ice pack      Ankle Exercises: Standing   Other Standing Ankle Exercises Tandem stance in // bars 3x30sec BIL      Ankle Exercises: Seated   BAPS Level 3;Sitting;Other (comment)   CW and CCW 2x10 each on R                      PT Short Term Goals - 01/15/21 1827       PT SHORT TERM GOAL #1   Title Patient will be independent with initial HEP.    Baseline Pt reports adherence to her HEP    Status Achieved    Target Date 01/01/21      PT SHORT TERM GOAL #2   Title Patient will demonstrate at least 5 degrees of ankle inversion/eversion AROM on the RLE.    Baseline 8d inversion, 6d eversion (01/15/2021)  Status Achieved    Target Date 01/15/21      PT SHORT TERM GOAL #3   Title Patient will demonstrate at least neutral dorsiflexion to assist in normalizing gait mechanics.    Baseline lacking 10; lacking 5d (01/15/2021)    Status On-going    Target Date 01/15/21      PT SHORT TERM GOAL #4   Title Patient will maintain SLS on the RLE for at least 3 seconds once cleared for weight bearing activity to improve stability with gait pattern.    Status On-going    Target Date 01/16/21               PT Long Term Goals - 01/15/21 1828       PT LONG TERM GOAL #1   Title Patient will demonstrate at least 4+/5 strength in Rt ankle to improve stability with walking/standing activity    Baseline MMT deferred due to post-op acuity.    Status On-going      PT LONG TERM GOAL #2   Title Patient will demonstrate normalized gait pattern with LRAD once cleared by surgeon.    Baseline NWB RLE    Status On-going      PT LONG TERM GOAL #3   Title Patient will maintain SLS on the RLE for at least 20 seconds to demonstrate increased  ankle stability with functional activities.    Baseline unable    Status On-going      PT LONG TERM GOAL #4   Title Patient will score at least 67% function on FOTO to signify clinically meaningful improvement in functional abilities.    Baseline 38%; 48% (01/15/2021)    Status On-going                   Plan - 01/17/21 1811     Clinical Impression Statement Pt is 7 weeks 3 days s/p ORIF R lateral malleolus. She responded well to all exercises today, demonstrating the ability to ambulate with less UE support while in her boot and using parallel bars. She reports increased pain during this exercises and tandem stance, but states that the increase in pain tolerable. Her pain returns to baseline prior to end of session. Deferred vasopneumatic due to ankle attachment not currently functional. She responded well to cryotherapy with decrease in pain to 3/10 to end session. She will continue to benefit from skilled PT to address her primary impairments and return to her prior level of function without limitation.    Personal Factors and Comorbidities Fitness    Examination-Activity Limitations Stairs;Stand;Locomotion Level;Transfers;Squat;Bend;Lift;Carry    Examination-Participation Restrictions Meal Prep;Driving;Cleaning;Community Activity;Shop;Laundry    Stability/Clinical Decision Making Stable/Uncomplicated    Clinical Decision Making Low    Rehab Potential Good    PT Frequency --   1-2x/week   PT Duration 8 weeks    PT Treatment/Interventions ADLs/Self Care Home Management;Cryotherapy;Electrical Stimulation;Moist Heat;Gait training;Stair training;Functional mobility training;Therapeutic activities;Therapeutic exercise;Balance training;Neuromuscular re-education;Patient/family education;Manual techniques;Passive range of motion;Dry needling;Taping;Vasopneumatic Device    PT Next Visit Plan manual to ankle, ankle AROM, vasopneumatic PRN    PT Home Exercise Plan Access Code: [email or web address]     Consulted and Agree with Plan of Care Patient             Patient will benefit from skilled therapeutic intervention in order to improve the following deficits and impairments:  Difficulty walking, Abnormal gait, Decreased range of motion, Decreased activity tolerance, Pain, Decreased balance, Impaired flexibility, Decreased mobility, Decreased strength, Impaired sensation  Visit Diagnosis: Pain in left ankle and joints of left foot  Stiffness of left ankle, not elsewhere classified  Difficulty in walking, not elsewhere classified  Muscle weakness (generalized)     Problem List Patient Active Problem List   Diagnosis Date Noted   Displaced fracture of lateral malleolus of right fibula, initial encounter for closed fracture     Carmelina Dane, PT, DPT 01/17/21 6:31 PM   [facility]patient Rehabilitation [facility] [address] [place], [place], [address] Phone: [phone]   Fax:  [phone]  Name: Melinda Padilla MRN: [id number] Date of Birth: 10/29/1990

## 2021-01-22 ENCOUNTER — Ambulatory Visit: Payer: 59

## 2021-01-24 ENCOUNTER — Ambulatory Visit: Payer: 59

## 2021-01-29 ENCOUNTER — Ambulatory Visit: Payer: 59 | Attending: Physician Assistant

## 2021-01-29 ENCOUNTER — Telehealth: Payer: Self-pay

## 2021-01-29 DIAGNOSIS — M25672 Stiffness of left ankle, not elsewhere classified: Secondary | ICD-10-CM | POA: Insufficient documentation

## 2021-01-29 DIAGNOSIS — M25572 Pain in left ankle and joints of left foot: Secondary | ICD-10-CM | POA: Insufficient documentation

## 2021-01-29 DIAGNOSIS — M6281 Muscle weakness (generalized): Secondary | ICD-10-CM | POA: Insufficient documentation

## 2021-01-29 DIAGNOSIS — R262 Difficulty in walking, not elsewhere classified: Secondary | ICD-10-CM | POA: Insufficient documentation

## 2021-01-29 NOTE — Telephone Encounter (Signed)
Left message for patient regarding her first no-show. Confirmed the pt's next appointment and provided the clinic phone number if she needs to cancel future appointments.

## 2021-01-31 ENCOUNTER — Other Ambulatory Visit: Payer: Self-pay

## 2021-01-31 ENCOUNTER — Ambulatory Visit: Payer: 59

## 2021-01-31 DIAGNOSIS — M6281 Muscle weakness (generalized): Secondary | ICD-10-CM | POA: Diagnosis present

## 2021-01-31 DIAGNOSIS — M25572 Pain in left ankle and joints of left foot: Secondary | ICD-10-CM

## 2021-01-31 DIAGNOSIS — R262 Difficulty in walking, not elsewhere classified: Secondary | ICD-10-CM

## 2021-01-31 DIAGNOSIS — M25672 Stiffness of left ankle, not elsewhere classified: Secondary | ICD-10-CM | POA: Diagnosis present

## 2021-01-31 NOTE — Therapy (Signed)
[facility] [address] [place], [place], [address] Phone: [phone]   Fax:  [phone]  Physical Therapy Treatment  Patient Details  Name: Melinda Padilla MRN: [id number] Date of Birth: November 01, 1990 Referring Provider (PT): Cristie Hem, [place]   Encounter Date: 01/31/2021   PT End of Session - 01/31/21 1732     Visit Number 8    Number of Visits 17    Date for PT Re-Evaluation 02/16/21    Authorization Type [employer]    PT Start Time 1700    PT Stop Time 1750   10 minutes cryotherapy   PT Time Calculation (min) 50 min    Equipment Utilized During Treatment Other (comment)   // bars   Activity Tolerance Patient tolerated treatment well    Behavior During Therapy [facility] for tasks assessed/performed             Past Medical History:  Diagnosis Date   Heart murmur     Past Surgical History:  Procedure Laterality Date   NO PAST SURGERIES     ORIF ANKLE FRACTURE Right 11/26/2020   Procedure: OPEN REDUCTION INTERNAL FIXATION (ORIF) RIGHT LATERAL MALLEOLUS;  Surgeon: Tarry Kos, MD;  Location: [facility];  Service: Orthopedics;  Laterality: Right;    There were no vitals filed for this visit.   Subjective Assessment - 01/31/21 1658     Subjective Pt reports walking outside on the sidewalk about 2 days/ week with her crutches and boot. She reports she has been doing well and has been adherent to her HEP.    Currently in Pain? Yes    Pain Score 5     Pain Location Ankle    Pain Orientation Right    Pain Descriptors / Indicators Aching;Throbbing    Pain Type Surgical pain    Pain Onset More than a month ago    Pain Frequency Constant                               [facility] Adult PT Treatment/Exercise - 01/31/21 0001       Modalities   Modalities Cryotherapy      Cryotherapy   Number Minutes Cryotherapy 10 Minutes    Cryotherapy Location Ankle    Type of  Cryotherapy Ice pack      Ankle Exercises: Standing   Other Standing Ankle Exercises Side-to-side and forward to back weight shifting in // bars without boot 2x10 BIL each, pt instructed to weight-bear about 30% through the right foot and not into increased pain.      Ankle Exercises: Seated   Towel Crunch 5 reps   Inversion and eversion x5 each   BAPS Level 3;Sitting;Other (comment)   CW and CCW 2x10 each on R                      PT Short Term Goals - 01/15/21 1827       PT SHORT TERM GOAL #1   Title Patient will be independent with initial HEP.    Baseline Pt reports adherence to her HEP    Status Achieved    Target Date 01/01/21      PT SHORT TERM GOAL #2   Title Patient will demonstrate at least 5 degrees of ankle inversion/eversion AROM on the RLE.    Baseline 8d inversion, 6d eversion (01/15/2021)    Status Achieved  Target Date 01/15/21      PT SHORT TERM GOAL #3   Title Patient will demonstrate at least neutral dorsiflexion to assist in normalizing gait mechanics.    Baseline lacking 10; lacking 5d (01/15/2021)    Status On-going    Target Date 01/15/21      PT SHORT TERM GOAL #4   Title Patient will maintain SLS on the RLE for at least 3 seconds once cleared for weight bearing activity to improve stability with gait pattern.    Status On-going    Target Date 01/16/21               PT Long Term Goals - 01/15/21 1828       PT LONG TERM GOAL #1   Title Patient will demonstrate at least 4+/5 strength in Rt ankle to improve stability with walking/standing activity    Baseline MMT deferred due to post-op acuity.    Status On-going      PT LONG TERM GOAL #2   Title Patient will demonstrate normalized gait pattern with LRAD once cleared by surgeon.    Baseline NWB RLE    Status On-going      PT LONG TERM GOAL #3   Title Patient will maintain SLS on the RLE for at least 20 seconds to demonstrate increased ankle stability with functional  activities.    Baseline unable    Status On-going      PT LONG TERM GOAL #4   Title Patient will score at least 67% function on FOTO to signify clinically meaningful improvement in functional abilities.    Baseline 38%; 48% (01/15/2021)    Status On-going                   Plan - 01/31/21 1734     Clinical Impression Statement Pt is 9 weeks 4 days s/p ORIF R lateral malleolus. She responded well to all exercises today, demonstrating the ability to weight shift without her boot and using parallel bars while only transferring about 20-30% of her body weight over her R foot. She reports increased pain during weight shifts from 5/10 to 7/10, which returned to baseline following the intervention. She demonstrates improved functional R ankle AROM as indicated by improved form with BAPS board at level 3. Deferred vasopneumatic due to ankle attachment not currently functional. She responded well to cryotherapy with decrease in pain to 3/10 to end session. She will continue to benefit from skilled PT to address her primary impairments and return to her prior level of function without limitation.    Examination-Activity Limitations Stairs;Stand;Locomotion Level;Transfers;Squat;Bend;Lift;Carry    Examination-Participation Restrictions Meal Prep;Driving;Cleaning;Community Activity;Shop;Laundry    Stability/Clinical Decision Making Stable/Uncomplicated    Clinical Decision Making Low    Rehab Potential Good    PT Frequency --   1-2x/week   PT Duration 8 weeks    PT Treatment/Interventions ADLs/Self Care Home Management;Cryotherapy;Electrical Stimulation;Moist Heat;Gait training;Stair training;Functional mobility training;Therapeutic activities;Therapeutic exercise;Balance training;Neuromuscular re-education;Patient/family education;Manual techniques;Passive range of motion;Dry needling;Taping;Vasopneumatic Device    PT Next Visit Plan manual to ankle, closed chain ankle ROM/ strengthening/ weight  shift as able    PT Home Exercise Plan Access Code: [email or web address]    Consulted and Agree with Plan of Care Patient             Patient will benefit from skilled therapeutic intervention in order to improve the following deficits and impairments:  Difficulty walking, Abnormal gait, Decreased range of motion, Decreased activity tolerance, Pain, Decreased balance,  Impaired flexibility, Decreased mobility, Decreased strength, Impaired sensation  Visit Diagnosis: Pain in left ankle and joints of left foot  Stiffness of left ankle, not elsewhere classified  Difficulty in walking, not elsewhere classified  Muscle weakness (generalized)     Problem List Patient Active Problem List   Diagnosis Date Noted   Displaced fracture of lateral malleolus of right fibula, initial encounter for closed fracture     Carmelina Dane, PT, DPT 01/31/21 5:54 PM   [facility] [address] [place], [place], [address] Phone: [phone]   Fax:  [phone]  Name: Melinda Padilla MRN: [id number] Date of Birth: [birth year]

## 2021-02-19 ENCOUNTER — Telehealth: Payer: Self-pay

## 2021-02-19 ENCOUNTER — Other Ambulatory Visit: Payer: Self-pay

## 2021-02-19 ENCOUNTER — Ambulatory Visit: Payer: 59 | Admitting: Orthopaedic Surgery

## 2021-02-19 ENCOUNTER — Ambulatory Visit: Payer: 59 | Attending: Physician Assistant

## 2021-02-19 DIAGNOSIS — R262 Difficulty in walking, not elsewhere classified: Secondary | ICD-10-CM | POA: Diagnosis present

## 2021-02-19 DIAGNOSIS — M25572 Pain in left ankle and joints of left foot: Secondary | ICD-10-CM | POA: Diagnosis not present

## 2021-02-19 DIAGNOSIS — M6281 Muscle weakness (generalized): Secondary | ICD-10-CM | POA: Diagnosis present

## 2021-02-19 DIAGNOSIS — M25672 Stiffness of left ankle, not elsewhere classified: Secondary | ICD-10-CM | POA: Insufficient documentation

## 2021-02-19 NOTE — Therapy (Signed)
[facility] [address] [place], [place], [address] Phone: [phone]   Fax:  [phone]  Physical Therapy Treatment  Patient Details  Name: Melinda Padilla MRN: [id number] Date of Birth: 03-16-1991 Referring Provider (PT): Cristie Hem, [place]   Encounter Date: 02/19/2021   PT End of Session - 02/19/21 1655     Visit Number 9    Number of Visits 17    Date for PT Re-Evaluation 02/16/21    Authorization Type [employer]    PT Start Time 1620    PT Stop Time 1705    PT Time Calculation (min) 45 min    Equipment Utilized During Treatment --    Activity Tolerance Patient tolerated treatment well    Behavior During Therapy [facility] for tasks assessed/performed             Past Medical History:  Diagnosis Date   Heart murmur     Past Surgical History:  Procedure Laterality Date   NO PAST SURGERIES     ORIF ANKLE FRACTURE Right 11/26/2020   Procedure: OPEN REDUCTION INTERNAL FIXATION (ORIF) RIGHT LATERAL MALLEOLUS;  Surgeon: Tarry Kos, MD;  Location: [facility];  Service: Orthopedics;  Laterality: Right;    There were no vitals filed for this visit.   Subjective Assessment - 02/19/21 1628     Subjective Pt reports her ankle felt better after her last visit. She is down to 1 crutch. She sees her MD again on 11/8. She has low level pain, unless she is walking more then it's about an 8/10 while elevated and iced, lasting for around an hour before it comes down.    Currently in Pain? Yes    Pain Score 3     Pain Location Ankle    Pain Orientation Left;Anterior;Lateral    Pain Descriptors / Indicators Discomfort    Pain Onset More than a month ago                               [facility] Adult PT Treatment/Exercise - 02/19/21 0001       Ankle Exercises: Seated   Towel Crunch 5 reps   INV, EV, towel scrunch   Heel Raises 10 reps   5-8#   BAPS Level 3;10 reps   2 sets AP, ML,  CW, CCW (harder than CW)   Heel Slides 10 reps;Right                       PT Short Term Goals - 01/15/21 1827       PT SHORT TERM GOAL #1   Title Patient will be independent with initial HEP.    Baseline Pt reports adherence to her HEP    Status Achieved    Target Date 01/01/21      PT SHORT TERM GOAL #2   Title Patient will demonstrate at least 5 degrees of ankle inversion/eversion AROM on the RLE.    Baseline 8d inversion, 6d eversion (01/15/2021)    Status Achieved    Target Date 01/15/21      PT SHORT TERM GOAL #3   Title Patient will demonstrate at least neutral dorsiflexion to assist in normalizing gait mechanics.    Baseline lacking 10; lacking 5d (01/15/2021)    Status On-going    Target Date 01/15/21      PT SHORT TERM GOAL #4   Title  Patient will maintain SLS on the RLE for at least 3 seconds once cleared for weight bearing activity to improve stability with gait pattern.    Status On-going    Target Date 01/16/21               PT Long Term Goals - 01/15/21 1828       PT LONG TERM GOAL #1   Title Patient will demonstrate at least 4+/5 strength in Rt ankle to improve stability with walking/standing activity    Baseline MMT deferred due to post-op acuity.    Status On-going      PT LONG TERM GOAL #2   Title Patient will demonstrate normalized gait pattern with LRAD once cleared by surgeon.    Baseline NWB RLE    Status On-going      PT LONG TERM GOAL #3   Title Patient will maintain SLS on the RLE for at least 20 seconds to demonstrate increased ankle stability with functional activities.    Baseline unable    Status On-going      PT LONG TERM GOAL #4   Title Patient will score at least 67% function on FOTO to signify clinically meaningful improvement in functional abilities.    Baseline 38%; 48% (01/15/2021)    Status On-going                   Plan - 02/19/21 1700     Clinical Impression Statement [name] presents 12 weeks  1 day s/p ORIF R lateral malleolus. She has been walking with boot and single crutch, returning to MD for f/u on 11/8. Pt called MD's office during session to ask about boot clearance and message was sent to MD. Pt is hypersensitive to light touch on lateral ankle and advised to desensitize herself with light touch. She demonstrates limited inversion, unable to reach neutral. Focused session on seated ROM and provided seated heel slides for HEP.    Examination-Activity Limitations Stairs;Stand;Locomotion Level;Transfers;Squat;Bend;Lift;Carry    Examination-Participation Restrictions Meal Prep;Driving;Cleaning;Community Activity;Shop;Laundry    Stability/Clinical Decision Making Stable/Uncomplicated    Rehab Potential Good    PT Frequency --   1-2x/week   PT Duration 8 weeks    PT Treatment/Interventions ADLs/Self Care Home Management;Cryotherapy;Electrical Stimulation;Moist Heat;Gait training;Stair training;Functional mobility training;Therapeutic activities;Therapeutic exercise;Balance training;Neuromuscular re-education;Patient/family education;Manual techniques;Passive range of motion;Dry needling;Taping;Vasopneumatic Device    PT Next Visit Plan manual to ankle, closed chain ankle ROM/ strengthening/ weight shift as able    PT Home Exercise Plan Access Code: [email or web address]    Consulted and Agree with Plan of Care Patient             Patient will benefit from skilled therapeutic intervention in order to improve the following deficits and impairments:  Difficulty walking, Abnormal gait, Decreased range of motion, Decreased activity tolerance, Pain, Decreased balance, Impaired flexibility, Decreased mobility, Decreased strength, Impaired sensation  Visit Diagnosis: Pain in left ankle and joints of left foot  Stiffness of left ankle, not elsewhere classified  Difficulty in walking, not elsewhere classified  Muscle weakness (generalized)     Problem List Patient Active Problem List    Diagnosis Date Noted   Displaced fracture of lateral malleolus of right fibula, initial encounter for closed fracture     Marcelline Mates, PT, DPT 02/19/2021, 5:22 PM  [facility] [address] [place], [place], [address] Phone: [phone]   Fax:  [phone]  Name: Namiyah Grantham MRN: [id number] Date of Birth: Aug 24, 1990

## 2021-02-19 NOTE — Telephone Encounter (Signed)
Patient called the office and states that she would like to know if she is able to ambulate without her boot. Patient is s/p ORIF right lateral malleolus ankle fracture on 11/26/2020

## 2021-02-20 NOTE — Telephone Encounter (Signed)
yes

## 2021-02-26 ENCOUNTER — Ambulatory Visit (INDEPENDENT_AMBULATORY_CARE_PROVIDER_SITE_OTHER): Payer: 59

## 2021-02-26 ENCOUNTER — Other Ambulatory Visit: Payer: Self-pay

## 2021-02-26 ENCOUNTER — Encounter: Payer: Self-pay | Admitting: Orthopaedic Surgery

## 2021-02-26 ENCOUNTER — Telehealth: Payer: Self-pay | Admitting: Orthopaedic Surgery

## 2021-02-26 ENCOUNTER — Ambulatory Visit (INDEPENDENT_AMBULATORY_CARE_PROVIDER_SITE_OTHER): Payer: 59 | Admitting: Orthopaedic Surgery

## 2021-02-26 DIAGNOSIS — S82891D Other fracture of right lower leg, subsequent encounter for closed fracture with routine healing: Secondary | ICD-10-CM

## 2021-02-26 MED ORDER — METHOCARBAMOL 500 MG PO TABS: 500.0000 mg | ORAL_TABLET | Freq: Two times a day (BID) | ORAL | 6 refills | Status: DC | PRN

## 2021-02-26 MED ORDER — TRAMADOL HCL 50 MG PO TABS: 50.0000 mg | ORAL_TABLET | Freq: Every day | ORAL | 2 refills | Status: DC | PRN

## 2021-02-26 NOTE — Telephone Encounter (Signed)
Pt calling requesting a call back from [place]. Pt asking if she can weight without her boot. Please call pt at [phone]

## 2021-02-26 NOTE — Progress Notes (Signed)
   Post-Op Visit Note   Patient: Melinda Padilla           Date of Birth: 09-23-1990           MRN: [id number] Visit Date: 02/26/2021 PCP: Patient, No Pcp Per (Inactive)   Assessment & Plan:  Chief Complaint:  Chief Complaint  Patient presents with   Right Ankle - Routine Post Op   Visit Diagnoses:  1. Closed fracture of right ankle with routine healing, subsequent encounter     Plan: Melinda Padilla is 3 months status post ORIF of right lateral malleolus fracture.  She is been doing physical therapy.  Overall she is doing much better but still has some pain and stiffness.  Right ankle scars fully healed.  She lacks about 5 degrees from neutral ankle dorsiflexion.  She has a fair amount of guarding and apprehension.  I explained that the fracture is healed but I am concerned that she is developing Achilles contracture.  She should work aggressively on Achilles stretching and dorsiflexion at home and physical therapy.  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  In the meantime she is going to work really hard at getting her ankle dorsiflexion back.  Follow-Up Instructions: Return in about 2 months (around 04/28/2021).   Orders:  Orders Placed This Encounter  Procedures   XR Ankle Complete Right   Meds ordered this encounter  Medications   methocarbamol (ROBAXIN) 500 MG tablet    Sig: Take 1 tablet (500 mg total) by mouth 2 (two) times daily as needed.    Dispense:  30 tablet    Refill:  6   traMADol (ULTRAM) 50 MG tablet    Sig: Take 1-2 tablets (50-100 mg total) by mouth daily as needed.    Dispense:  30 tablet    Refill:  2    Imaging: XR Ankle Complete Right  Result Date: 02/26/2021 Stable fixation alignment fracture.  Fracture has demonstrated significant consolidation.  Midfoot arthritic changes.  Disuse osteopenia.   PMFS History: Patient Active Problem List   Diagnosis Date Noted   Displaced fracture  of lateral malleolus of right fibula, initial encounter for closed fracture    Past Medical History:  Diagnosis Date   Heart murmur     History reviewed. No pertinent family history.  Past Surgical History:  Procedure Laterality Date   NO PAST SURGERIES     ORIF ANKLE FRACTURE Right 11/26/2020   Procedure: OPEN REDUCTION INTERNAL FIXATION (ORIF) RIGHT LATERAL MALLEOLUS;  Surgeon: Tarry Kos, MD;  Location: [facility];  Service: Orthopedics;  Laterality: Right;   Social History   Occupational History   Not on file  Tobacco Use   Smoking status: Former    Packs/day: 1.00    Types: Cigarettes    Quit date: 08/19/2020    Years since quitting: 0.5   Smokeless tobacco: Never  Vaping Use   Vaping Use: Some days  Substance and Sexual Activity   Alcohol use: No   Drug use: Yes    Types: Marijuana   Sexual activity: Never    Birth control/protection: None

## 2021-02-27 NOTE — Telephone Encounter (Signed)
yes

## 2021-02-27 NOTE — Telephone Encounter (Signed)
Patient aware.

## 2021-03-05 ENCOUNTER — Other Ambulatory Visit: Payer: Self-pay

## 2021-03-05 ENCOUNTER — Ambulatory Visit: Payer: 59

## 2021-03-05 DIAGNOSIS — M6281 Muscle weakness (generalized): Secondary | ICD-10-CM

## 2021-03-05 DIAGNOSIS — R262 Difficulty in walking, not elsewhere classified: Secondary | ICD-10-CM

## 2021-03-05 DIAGNOSIS — M25572 Pain in left ankle and joints of left foot: Secondary | ICD-10-CM | POA: Diagnosis not present

## 2021-03-05 DIAGNOSIS — M25672 Stiffness of left ankle, not elsewhere classified: Secondary | ICD-10-CM

## 2021-03-05 NOTE — Therapy (Signed)
[facility] [address] [place], [place], [address] Phone: [phone]   Fax:  [phone]  Physical Therapy Treatment  Patient Details  Name: Melinda Padilla MRN: [id number] Date of Birth: 17-Nov-1990 Referring Provider (PT): Cristie Hem, [place]   Encounter Date: 03/05/2021   PT End of Session - 03/05/21 1506     Visit Number 10    Number of Visits 17    Date for PT Re-Evaluation 02/16/21    Authorization Type [employer]    PT Start Time 1451    PT Stop Time 1530    PT Time Calculation (min) 39 min    Equipment Utilized During Treatment Other (comment)   CAM boot and crutches   Activity Tolerance Patient tolerated treatment well    Behavior During Therapy [facility] for tasks assessed/performed             Past Medical History:  Diagnosis Date   Heart murmur     Past Surgical History:  Procedure Laterality Date   NO PAST SURGERIES     ORIF ANKLE FRACTURE Right 11/26/2020   Procedure: OPEN REDUCTION INTERNAL FIXATION (ORIF) RIGHT LATERAL MALLEOLUS;  Surgeon: Tarry Kos, MD;  Location: [facility];  Service: Orthopedics;  Laterality: Right;    There were no vitals filed for this visit.   Subjective Assessment - 03/05/21 1458     Subjective Pt reports she saw Dr. Roda Shutters last week, he said that her ankle mostly healed, but "still a crack in it". Pt verbalizes she asked him if she can apply pressure to foot, and he said "as long as she has her soft brace". It's been hard to sleep due to pain coming up the side of the leg.    Currently in Pain? Yes    Pain Score 3     Pain Onset More than a month ago                     [facility] Adult PT Treatment/Exercise:   Therapeutic Exercise:  - Heel slides 5x10" with foot in neutral - Heel raises with L foot keeping R great toe down  - Toe Yoga using L foot to assist - Inversion (eversion to neutral) max verba cueing to prevent knee valgus    Gait: Instructed in gait at counter with L UE support wearing CAM boot (edu pt to wear tennis shoe for more even height and to bring soft brace next visit) -- Pt demonstrates L lateral lean and heavy reliance on UE, lack of knee extension frequently during heel strike    Self Care/Management: - Educated pt on using soft brace at home and bearing weight through R LE, wearing a sneaker to balance out CAM boot when using it to prevent L lateral lean, weaning from crutches to ambulate in CAM boot with no AD - Working on neutral foot and knee ankle alignment during HEP                 PT Short Term Goals - 01/15/21 1827       PT SHORT TERM GOAL #1   Title Patient will be independent with initial HEP.    Baseline Pt reports adherence to her HEP    Status Achieved    Target Date 01/01/21      PT SHORT TERM GOAL #2   Title Patient will demonstrate at least 5 degrees of ankle inversion/eversion AROM on the RLE.  Baseline 8d inversion, 6d eversion (01/15/2021)    Status Achieved    Target Date 01/15/21      PT SHORT TERM GOAL #3   Title Patient will demonstrate at least neutral dorsiflexion to assist in normalizing gait mechanics.    Baseline lacking 10; lacking 5d (01/15/2021)    Status On-going    Target Date 01/15/21      PT SHORT TERM GOAL #4   Title Patient will maintain SLS on the RLE for at least 3 seconds once cleared for weight bearing activity to improve stability with gait pattern.    Status On-going    Target Date 01/16/21               PT Long Term Goals - 01/15/21 1828       PT LONG TERM GOAL #1   Title Patient will demonstrate at least 4+/5 strength in Rt ankle to improve stability with walking/standing activity    Baseline MMT deferred due to post-op acuity.    Status On-going      PT LONG TERM GOAL #2   Title Patient will demonstrate normalized gait pattern with LRAD once cleared by surgeon.    Baseline NWB RLE    Status On-going      PT  LONG TERM GOAL #3   Title Patient will maintain SLS on the RLE for at least 20 seconds to demonstrate increased ankle stability with functional activities.    Baseline unable    Status On-going      PT LONG TERM GOAL #4   Title Patient will score at least 67% function on FOTO to signify clinically meaningful improvement in functional abilities.    Baseline 38%; 48% (01/15/2021)    Status On-going                   Plan - 03/05/21 1506     Clinical Impression Statement Melinda Padilla presents with CAM boot and B axillary crutches today versus one crutch, reportedly due to rain. Despite seeing her MD last week and being OK'ed to ambulate in soft brace, pt did not bring this in today. Advised pt to bring soft brace next time and continue to work on ambulating in CAM boot with 1 crutch, weaning to no crutch as pt is 14 weeks out from surgery and XR showed good bone consolidation, but also showed some osteopenia from lack of weight bearing. Practiced gait at counter today, as well as with one crutch for improved mechanics. Reviewed HEP and reiterated neutral foot and knee/ankle alignment.    Examination-Activity Limitations Stairs;Stand;Locomotion Level;Transfers;Squat;Bend;Lift;Carry    Examination-Participation Restrictions Meal Prep;Driving;Cleaning;Community Activity;Shop;Laundry    Stability/Clinical Decision Making Stable/Uncomplicated    Rehab Potential Good    PT Frequency --   1-2x/week   PT Duration 8 weeks    PT Treatment/Interventions ADLs/Self Care Home Management;Cryotherapy;Electrical Stimulation;Moist Heat;Gait training;Stair training;Functional mobility training;Therapeutic activities;Therapeutic exercise;Balance training;Neuromuscular re-education;Patient/family education;Manual techniques;Passive range of motion;Dry needling;Taping;Vasopneumatic Device    PT Next Visit Plan manual to ankle PRN, closed chain ankle ROM/ strengthening/ weight shift as able, gait    PT Home Exercise  Plan Access Code: [email or web address]    Consulted and Agree with Plan of Care Patient             Patient will benefit from skilled therapeutic intervention in order to improve the following deficits and impairments:  Difficulty walking, Abnormal gait, Decreased range of motion, Decreased activity tolerance, Pain, Decreased balance, Impaired flexibility, Decreased mobility, Decreased strength, Impaired sensation  Visit Diagnosis: Pain in left ankle and joints of left foot  Stiffness of left ankle, not elsewhere classified  Difficulty in walking, not elsewhere classified  Muscle weakness (generalized)     Problem List Patient Active Problem List   Diagnosis Date Noted   Displaced fracture of lateral malleolus of right fibula, initial encounter for closed fracture     Marcelline Mates, PT, DPT 03/05/2021, 5:03 PM  [facility] [address] [place], [place], [address] Phone: [phone]   Fax:  [phone]  Name: Melinda Padilla MRN: [id number] Date of Birth: 1990/10/11

## 2021-03-18 ENCOUNTER — Ambulatory Visit: Payer: 59 | Admitting: Physical Therapy

## 2021-04-01 ENCOUNTER — Encounter: Payer: Self-pay | Admitting: Physical Therapy

## 2021-04-01 ENCOUNTER — Other Ambulatory Visit: Payer: Self-pay

## 2021-04-01 ENCOUNTER — Ambulatory Visit: Payer: 59 | Attending: Physician Assistant | Admitting: Physical Therapy

## 2021-04-01 DIAGNOSIS — M6281 Muscle weakness (generalized): Secondary | ICD-10-CM | POA: Insufficient documentation

## 2021-04-01 DIAGNOSIS — M25671 Stiffness of right ankle, not elsewhere classified: Secondary | ICD-10-CM | POA: Insufficient documentation

## 2021-04-01 DIAGNOSIS — M25672 Stiffness of left ankle, not elsewhere classified: Secondary | ICD-10-CM | POA: Diagnosis not present

## 2021-04-01 DIAGNOSIS — M25571 Pain in right ankle and joints of right foot: Secondary | ICD-10-CM | POA: Diagnosis not present

## 2021-04-01 DIAGNOSIS — R262 Difficulty in walking, not elsewhere classified: Secondary | ICD-10-CM | POA: Insufficient documentation

## 2021-04-01 DIAGNOSIS — M25572 Pain in left ankle and joints of left foot: Secondary | ICD-10-CM | POA: Diagnosis not present

## 2021-04-01 DIAGNOSIS — R2689 Other abnormalities of gait and mobility: Secondary | ICD-10-CM | POA: Diagnosis present

## 2021-04-02 ENCOUNTER — Encounter: Payer: Self-pay | Admitting: Physical Therapy

## 2021-04-02 NOTE — Therapy (Signed)
[facility] Outpatient Rehabilitation [facility] [address] [place], [place], [address] Phone: [phone]   Fax:  [phone]  Physical Therapy Treatment / ERO  Patient Details  Name: Melinda Padilla MRN: [id number] Date of Birth: [birth year] Referring Provider (PT): Cristie Hem, [place]   Encounter Date: 04/01/2021   PT End of Session - 04/01/21 1710     Visit Number 11    Number of Visits 17    Date for PT Re-Evaluation 05/13/21    Authorization Type [employer]    PT Start Time 1700    PT Stop Time 1745   5 min discussing POC update   PT Time Calculation (min) 45 min    Activity Tolerance Patient tolerated treatment well    Behavior During Therapy [facility] for tasks assessed/performed             Past Medical History:  Diagnosis Date   Heart murmur     Past Surgical History:  Procedure Laterality Date   NO PAST SURGERIES     ORIF ANKLE FRACTURE Right 11/26/2020   Procedure: OPEN REDUCTION INTERNAL FIXATION (ORIF) RIGHT LATERAL MALLEOLUS;  Surgeon: Tarry Kos, MD;  Location: [facility];  Service: Orthopedics;  Laterality: Right;    There were no vitals filed for this visit.   Subjective Assessment - 04/01/21 1711     Subjective Patient reports she has been walking out of the boot at home with the brace but is still using a crutch for support, but is still using the boot whenever outside of the home without any crutches. States discomfort more on the inside and top of the ankle with walking.    Currently in Pain? Yes    Pain Score 3     Pain Location Ankle    Pain Orientation Right    Pain Descriptors / Indicators --   "uncomfortable"   Pain Type Chronic pain;Surgical pain    Pain Onset More than a month ago    Pain Frequency Intermittent    Aggravating Factors  Walking, standing, pain can come on while resting or lying down    Pain Relieving Factors Ice, medication                [facility] PT Assessment - 04/02/21  0001       Assessment   Medical Diagnosis Closed fracture of right ankle with routine healing, subsequent encounter    Referring Provider (PT) Cristie Hem, PA-C    Onset Date/Surgical Date 11/26/20      Precautions   Precautions None      Restrictions   Weight Bearing Restrictions No      Balance Screen   Has the patient fallen in the past 6 months No    Has the patient had a decrease in activity level because of a fear of falling?  Yes    Is the patient reluctant to leave their home because of a fear of falling?  No      Prior Function   Level of Independence Independent      Cognition   Overall Cognitive Status Within Functional Limits for tasks assessed      Observation/Other Assessments   Observations Patient appears in no apparent distress, she is wearing tall CAM boot on right, throughout session patient with constant toe movement/twithcing that she has trouble controlling when cued    Focus on Therapeutic Outcomes (FOTO)  Not assessed this visit      Sensation  Light Touch Appears Intact      ROM / Strength   AROM / PROM / Strength AROM;PROM;Strength      AROM   AROM Assessment Site Ankle    Right/Left Ankle Right;Left    Right Ankle Dorsiflexion -15    Right Ankle Plantar Flexion 55    Right Ankle Inversion 18    Right Ankle Eversion 8    Left Ankle Dorsiflexion 5    Left Ankle Plantar Flexion 65    Left Ankle Inversion 25    Left Ankle Eversion 10      PROM   PROM Assessment Site Ankle    Right/Left Ankle Right    Right Ankle Dorsiflexion -10      Strength   Strength Assessment Site Ankle    Right/Left Ankle Right;Left    Right Ankle Dorsiflexion 4/5    Right Ankle Plantar Flexion 2/5    Right Ankle Inversion 4/5    Right Ankle Eversion 4/5    Left Ankle Dorsiflexion 5/5    Left Ankle Plantar Flexion 5/5    Left Ankle Inversion 5/5    Left Ankle Eversion 5/5      Palpation   Palpation comment Diffuse tenderness with light palpation  throughout ankle      Ambulation/Gait   Ambulation/Gait Yes    Gait Pattern Step-to pattern    Gait Comments Patient ambulating with CAM boot at start of session; when ambulating without CAM boot she demonstrates significant antalgia with step-to pattern               [facility] Adult PT Treatment/Exercise:  Therapeutic Exercise: Longsitting calf stretch with strap 3 x 30 sec Standing calf stretch at wall 3 x 30 sec Standing knee to wall ankle DF stretch 5 x 15 sec 4-way ankle with green band x 10 each - demo for HEP  Manual Therapy: STM and myofascial release for right calf AP joint mobs right talocrural joint grade III-IV PROM right ankle DF  Neuromuscular re-ed: N/A  Therapeutic Activity: N/A  Modalities: N/A  Self Care: N/A                      PT Education - 04/01/21 1713     Education Details POC update, HEP update, weaning from boot and progressing weight bearing tolerance, importance of stretching and continued swelling control with elevation    Person(s) Educated Patient    Methods Explanation;Demonstration;Tactile cues;Verbal cues;Handout    Comprehension Verbalized understanding;Returned demonstration;Verbal cues required;Tactile cues required;Need further instruction              PT Short Term Goals - 04/02/21 0933       PT SHORT TERM GOAL #1   Title Patient will be independent with initial HEP.    Baseline updating HEP    Time 3    Period Weeks    Status On-going    Target Date 04/22/21      PT SHORT TERM GOAL #2   Title Patient will demonstrate at least 5 degrees of ankle inversion/eversion AROM on the RLE.    Baseline 8d inversion, 6d eversion (01/15/2021)    Status Achieved      PT SHORT TERM GOAL #3   Title Patient will demonstrate at least neutral dorsiflexion to assist in normalizing gait mechanics.    Baseline lacking 15 deg actively    Time 3    Period Weeks    Status On-going    Target Date 04/22/21  PT  SHORT TERM GOAL #4   Title Patient will maintain SLS on the RLE for at least 3 seconds to improve stability with gait pattern.    Baseline unable    Time 3    Period Weeks    Status On-going    Target Date 04/22/21               PT Long Term Goals - 04/02/21 0932       PT LONG TERM GOAL #1   Title Patient will demonstrate at least 4+/5 strength in Rt ankle to improve stability with walking/standing activity    Baseline patient demonstrates right ankle strength deficits    Time 6    Period Weeks    Status On-going    Target Date 05/13/21      PT LONG TERM GOAL #2   Title Patient will demonstrate normalized gait pattern with LRAD once cleared by surgeon.    Baseline patient continues to exhibit antalgic gait on right    Time 6    Period Weeks    Status On-going    Target Date 05/13/21      PT LONG TERM GOAL #3   Title Patient will maintain SLS on the RLE for at least 20 seconds to demonstrate increased ankle stability with functional activities.    Baseline unable    Time 6    Period Weeks    Status On-going    Target Date 05/13/21      PT LONG TERM GOAL #4   Title Patient will score at least 67% function on FOTO to signify clinically meaningful improvement in functional abilities.    Baseline not assessed this visit    Time 6    Period Weeks    Status On-going    Target Date 05/13/21                   Plan - 04/01/21 1737     Clinical Impression Statement Patient with fair tolerance to therapy with no adverse effects. She continues to demonstrate a signficant dorsiflexion limitation of the right ankle that results in gait deviations. She has continues to wear the CAM boot outside of the home and this likely contributes to continued ankle stiffness. Patient also demonstrates gross strength deficit of the right ankle and poor tolerance for weight bearing on RLE. Patient was thoroughly instructed on continued weaning from the CAM boot by using ankle brace  and progressing weight bearing tolerance at home. Therapy focused on improving ankle motion and initiating banded ankle strengthening, and patient's HEP was update to progress her stretching and strengthening at home. Patient did demonstrate toe movement/twitching throughout session and she had difficulty controlling this movement when cued, unclear etiology of these discordant movements. Patient would benefit from continued skilled PT to progress her mobility and strength in order to reduce pain and maximize functional ability.    PT Frequency 1x / week    PT Duration 6 weeks    PT Treatment/Interventions ADLs/Self Care Home Management;Cryotherapy;Electrical Stimulation;Moist Heat;Gait training;Stair training;Functional mobility training;Therapeutic activities;Therapeutic exercise;Balance training;Neuromuscular re-education;Patient/family education;Manual techniques;Passive range of motion;Dry needling;Taping;Vasopneumatic Device    PT Next Visit Plan manual to calf / ankle to improve DF, MWM using strap for ankle DF, closed chain ankle ROM/ strengthening/ weight shift as able, gait    PT Home Exercise Plan Access Code: [email or web address]    Consulted and Agree with Plan of Care Patient  Patient will benefit from skilled therapeutic intervention in order to improve the following deficits and impairments:  Difficulty walking, Abnormal gait, Decreased range of motion, Decreased activity tolerance, Pain, Decreased balance, Impaired flexibility, Decreased mobility, Decreased strength, Impaired sensation  Visit Diagnosis: Pain in right ankle and joints of right foot  Stiffness of right ankle, not elsewhere classified  Muscle weakness (generalized)  Other abnormalities of gait and mobility     Problem List Patient Active Problem List   Diagnosis Date Noted   Displaced fracture of lateral malleolus of right fibula, initial encounter for closed fracture     Rosana Hoes, PT, DPT, LAT,  ATC 04/02/21  9:42 AM Phone: [phone] Fax: [phone]   [facility] Outpatient Rehabilitation [facility] [address] [place], [place], [address] Phone: [phone]   Fax:  [phone]  Name: Caterine Mcmeans MRN: [id number] Date of Birth: 1990-05-07

## 2021-04-10 ENCOUNTER — Ambulatory Visit: Payer: 59 | Admitting: Physical Therapy

## 2021-04-10 ENCOUNTER — Encounter: Payer: Self-pay | Admitting: Physical Therapy

## 2021-04-10 ENCOUNTER — Other Ambulatory Visit: Payer: Self-pay

## 2021-04-10 DIAGNOSIS — M25671 Stiffness of right ankle, not elsewhere classified: Secondary | ICD-10-CM

## 2021-04-10 DIAGNOSIS — M25571 Pain in right ankle and joints of right foot: Secondary | ICD-10-CM

## 2021-04-10 DIAGNOSIS — R2689 Other abnormalities of gait and mobility: Secondary | ICD-10-CM

## 2021-04-10 DIAGNOSIS — M6281 Muscle weakness (generalized): Secondary | ICD-10-CM

## 2021-04-10 NOTE — Therapy (Signed)
[facility] Outpatient Rehabilitation [facility] [address] [place], [place], [address] Phone: [phone]   Fax:  [phone]  Physical Therapy Treatment  Patient Details  Name: Melinda Padilla MRN: [id number] Date of Birth: 01/17/1991 Referring Provider (PT): Cristie Hem, [place]   Encounter Date: 04/10/2021   PT End of Session - 04/10/21 1534     Visit Number 12    Number of Visits 17    Date for PT Re-Evaluation 05/13/21    Authorization Type [employer]    PT Start Time 1530    PT Stop Time 1600    PT Time Calculation (min) 30 min    Activity Tolerance Patient tolerated treatment well    Behavior During Therapy [facility] for tasks assessed/performed             Past Medical History:  Diagnosis Date   Heart murmur     Past Surgical History:  Procedure Laterality Date   NO PAST SURGERIES     ORIF ANKLE FRACTURE Right 11/26/2020   Procedure: OPEN REDUCTION INTERNAL FIXATION (ORIF) RIGHT LATERAL MALLEOLUS;  Surgeon: Tarry Kos, MD;  Location: [facility];  Service: Orthopedics;  Laterality: Right;    There were no vitals filed for this visit.   Subjective Assessment - 04/10/21 1555     Subjective Patient reports she feels the new exercises are helping, she can get her knee a little closer to the wall when stretching. She continues to have pain in the ankle and twitching of her toes. She has been out of the boot and using the brace more.    Currently in Pain? Yes    Pain Score 5     Pain Location Ankle    Pain Orientation Right    Pain Descriptors / Indicators Tightness   "uncomfortable"   Pain Onset More than a month ago    Pain Frequency Intermittent    Aggravating Factors  Walking, standing, pain can come on while resting or lying down                [facility] PT Assessment - 04/10/21 0001       AROM   Right Ankle Dorsiflexion -10      PROM   Right Ankle Dorsiflexion -6                 [facility] Adult PT  Treatment/Exercise:   Therapeutic Exercise: Slant board calf stretch 3 x 30 sec Standing knee to wall ankle DF stretch 5 x 15 sec   Manual Therapy: STM and myofascial release for right calf, primarily lateral gastroc, with and without active release Knee ankle DF MWM using belt 2 x 10 AP joint mobs right talocrural joint grade III-IV to improve ankle DF PROM right ankle DF  Gait Training: Patient instructed on proper heel-toe progress and toe off, maintaining foot in relative neutral alignment to avoid excessive toe out, avoid ambulating with foot supinated or on lateral aspect of foot           PT Education - 04/10/21 1601     Education Details HEP, emphasis on stretching    Methods Explanation;Demonstration;Verbal cues    Comprehension Verbalized understanding;Returned demonstration;Verbal cues required;Need further instruction              PT Short Term Goals - 04/02/21 0933       PT SHORT TERM GOAL #1   Title Patient will be independent with initial HEP.    Baseline  updating HEP    Time 3    Period Weeks    Status On-going    Target Date 04/22/21      PT SHORT TERM GOAL #2   Title Patient will demonstrate at least 5 degrees of ankle inversion/eversion AROM on the RLE.    Baseline 8d inversion, 6d eversion (01/15/2021)    Status Achieved      PT SHORT TERM GOAL #3   Title Patient will demonstrate at least neutral dorsiflexion to assist in normalizing gait mechanics.    Baseline lacking 15 deg actively    Time 3    Period Weeks    Status On-going    Target Date 04/22/21      PT SHORT TERM GOAL #4   Title Patient will maintain SLS on the RLE for at least 3 seconds to improve stability with gait pattern.    Baseline unable    Time 3    Period Weeks    Status On-going    Target Date 04/22/21               PT Long Term Goals - 04/02/21 0932       PT LONG TERM GOAL #1   Title Patient will demonstrate at least 4+/5 strength in Rt ankle to  improve stability with walking/standing activity    Baseline patient demonstrates right ankle strength deficits    Time 6    Period Weeks    Status On-going    Target Date 05/13/21      PT LONG TERM GOAL #2   Title Patient will demonstrate normalized gait pattern with LRAD once cleared by surgeon.    Baseline patient continues to exhibit antalgic gait on right    Time 6    Period Weeks    Status On-going    Target Date 05/13/21      PT LONG TERM GOAL #3   Title Patient will maintain SLS on the RLE for at least 20 seconds to demonstrate increased ankle stability with functional activities.    Baseline unable    Time 6    Period Weeks    Status On-going    Target Date 05/13/21      PT LONG TERM GOAL #4   Title Patient will score at least 67% function on FOTO to signify clinically meaningful improvement in functional abilities.    Baseline not assessed this visit    Time 6    Period Weeks    Status On-going    Target Date 05/13/21                   Plan - 04/10/21 1605     Clinical Impression Statement Patient with fair tolerance to therapy with no adverse effects. Patient reported she had to leave early so therapy cut short. Therapy primarily focused on manual therapy and stretching to improve ankle dorsiflexion. She continues to demonstrate a signficant limitation in her motion but has improved since last visit. She was instructed on gait mechanics for proper heel-toe progression and avoiding excessive toe out due to limitation in dorsiflexion. She was encouraged to emphasize stretching at home, no changes made to HEP. Patient would benefit from continued skilled PT to progress her mobility and strength in order to reduce pain and maximize functional ability.    PT Treatment/Interventions ADLs/Self Care Home Management;Cryotherapy;Electrical Stimulation;Moist Heat;Gait training;Stair training;Functional mobility training;Therapeutic activities;Therapeutic exercise;Balance  training;Neuromuscular re-education;Patient/family education;Manual techniques;Passive range of motion;Dry needling;Taping;Vasopneumatic Device    PT Next  Visit Plan manual to calf / ankle to improve DF, MWM using strap for ankle DF, closed chain ankle ROM/ strengthening/ weight shift as able, gait    PT Home Exercise Plan Access Code: [email or web address]    Consulted and Agree with Plan of Care Patient             Patient will benefit from skilled therapeutic intervention in order to improve the following deficits and impairments:  Difficulty walking, Abnormal gait, Decreased range of motion, Decreased activity tolerance, Pain, Decreased balance, Impaired flexibility, Decreased mobility, Decreased strength, Impaired sensation  Visit Diagnosis: Pain in right ankle and joints of right foot  Stiffness of right ankle, not elsewhere classified  Muscle weakness (generalized)  Other abnormalities of gait and mobility     Problem List Patient Active Problem List   Diagnosis Date Noted   Displaced fracture of lateral malleolus of right fibula, initial encounter for closed fracture     Rosana Hoes, PT, DPT, LAT, ATC 04/10/21  4:14 PM Phone: [phone] Fax: [phone]   [facility] Outpatient Rehabilitation [facility] [address] [place], [place], [address] Phone: [phone]   Fax:  [phone]  Name: Melinda Padilla MRN: [id number] Date of Birth: [birth year]

## 2021-04-18 ENCOUNTER — Ambulatory Visit: Payer: 59 | Admitting: Physical Therapy

## 2021-04-24 ENCOUNTER — Other Ambulatory Visit: Payer: Self-pay

## 2021-04-24 ENCOUNTER — Ambulatory Visit: Payer: 59 | Attending: Physician Assistant

## 2021-04-24 DIAGNOSIS — M25571 Pain in right ankle and joints of right foot: Secondary | ICD-10-CM | POA: Insufficient documentation

## 2021-04-24 DIAGNOSIS — R2689 Other abnormalities of gait and mobility: Secondary | ICD-10-CM | POA: Diagnosis present

## 2021-04-24 DIAGNOSIS — M25671 Stiffness of right ankle, not elsewhere classified: Secondary | ICD-10-CM | POA: Insufficient documentation

## 2021-04-24 DIAGNOSIS — M6281 Muscle weakness (generalized): Secondary | ICD-10-CM | POA: Insufficient documentation

## 2021-04-24 NOTE — Therapy (Signed)
[facility] Outpatient Rehabilitation [facility] [address] [place], [place], [address] Phone: [phone]   Fax:  [phone]  Physical Therapy Treatment  Patient Details  Name: Melinda Padilla MRN: [id number] Date of Birth: 1991-02-07 Referring Provider (PT): Cristie Hem, [place]   Encounter Date: 04/24/2021   PT End of Session - 04/24/21 1524     Visit Number 13    Number of Visits 17    Date for PT Re-Evaluation 05/13/21    Authorization Type [employer]    PT Start Time 1530    PT Stop Time 1615    PT Time Calculation (min) 45 min    Activity Tolerance Patient tolerated treatment well    Behavior During Therapy [facility] for tasks assessed/performed             Past Medical History:  Diagnosis Date   Heart murmur     Past Surgical History:  Procedure Laterality Date   NO PAST SURGERIES     ORIF ANKLE FRACTURE Right 11/26/2020   Procedure: OPEN REDUCTION INTERNAL FIXATION (ORIF) RIGHT LATERAL MALLEOLUS;  Surgeon: Tarry Kos, MD;  Location: [facility];  Service: Orthopedics;  Laterality: Right;    There were no vitals filed for this visit.   Subjective Assessment - 04/24/21 1527     Subjective Pt reports being able to walk more often, adding that her pain has been steadily improving. She reports that her HEP is going well.    Limitations Walking;Standing;Lifting;House hold activities    Currently in Pain? Yes    Pain Score 3     Pain Location Ankle    Pain Orientation Right    Pain Descriptors / Indicators Tightness    Pain Type Chronic pain;Surgical pain    Pain Onset More than a month ago    Pain Frequency Intermittent                             [facility] Adult PT Treatment/Exercise:   Therapeutic Exercise: Marching on BOSU ball in // bars 3x20 Tandem stance with A/P rocking on Airex pad  In // bars 2x20 BIL Heel raises on edge of Airex pad 4x5 Supine bridging with marching 3x10    Gait  Training: Stepping over yellow hurdles in // bars with emphasis on ankle DF during swing phase of gait x3 laps Toe walking in // bars x3 laps           PT Education - 04/24/21 1532     Education Details Educated to continue to perform HEP, increase walking time each day.    Person(s) Educated Patient    Methods Explanation    Comprehension Verbalized understanding              PT Short Term Goals - 04/02/21 0933       PT SHORT TERM GOAL #1   Title Patient will be independent with initial HEP.    Baseline updating HEP    Time 3    Period Weeks    Status On-going    Target Date 04/22/21      PT SHORT TERM GOAL #2   Title Patient will demonstrate at least 5 degrees of ankle inversion/eversion AROM on the RLE.    Baseline 8d inversion, 6d eversion (01/15/2021)    Status Achieved      PT SHORT TERM GOAL #3   Title Patient will demonstrate at least neutral dorsiflexion to  assist in normalizing gait mechanics.    Baseline lacking 15 deg actively    Time 3    Period Weeks    Status On-going    Target Date 04/22/21      PT SHORT TERM GOAL #4   Title Patient will maintain SLS on the RLE for at least 3 seconds to improve stability with gait pattern.    Baseline unable    Time 3    Period Weeks    Status On-going    Target Date 04/22/21               PT Long Term Goals - 04/02/21 0932       PT LONG TERM GOAL #1   Title Patient will demonstrate at least 4+/5 strength in Rt ankle to improve stability with walking/standing activity    Baseline patient demonstrates right ankle strength deficits    Time 6    Period Weeks    Status On-going    Target Date 05/13/21      PT LONG TERM GOAL #2   Title Patient will demonstrate normalized gait pattern with LRAD once cleared by surgeon.    Baseline patient continues to exhibit antalgic gait on right    Time 6    Period Weeks    Status On-going    Target Date 05/13/21      PT LONG TERM GOAL #3   Title Patient  will maintain SLS on the RLE for at least 20 seconds to demonstrate increased ankle stability with functional activities.    Baseline unable    Time 6    Period Weeks    Status On-going    Target Date 05/13/21      PT LONG TERM GOAL #4   Title Patient will score at least 67% function on FOTO to signify clinically meaningful improvement in functional abilities.    Baseline not assessed this visit    Time 6    Period Weeks    Status On-going    Target Date 05/13/21                   Plan - 04/24/21 1557     Clinical Impression Statement Pt responded well to all interventions today, demonstrating good form and mild increase in pain with selected interventions. She demonstrates improved weight-bearing tolerance and gait mechanics as indicated by her ability to complete more advanced weight-bearing balance/ gait exercises. She will continue to benefit from skilled PT to address her primary impairments and return to her prior level of function with less limitation.    Personal Factors and Comorbidities Fitness    Examination-Activity Limitations Stairs;Stand;Locomotion Level;Transfers;Squat;Bend;Lift;Carry    Examination-Participation Restrictions Meal Prep;Driving;Cleaning;Community Activity;Shop;Laundry    Stability/Clinical Decision Making Stable/Uncomplicated    Clinical Decision Making Low    Rehab Potential Good    PT Frequency 1x / week    PT Duration 6 weeks    PT Treatment/Interventions ADLs/Self Care Home Management;Cryotherapy;Electrical Stimulation;Moist Heat;Gait training;Stair training;Functional mobility training;Therapeutic activities;Therapeutic exercise;Balance training;Neuromuscular re-education;Patient/family education;Manual techniques;Passive range of motion;Dry needling;Taping;Vasopneumatic Device    PT Next Visit Plan manual to calf / ankle to improve DF, MWM using strap for ankle DF, closed chain ankle ROM/ strengthening/ weight shift as able, gait    PT Home  Exercise Plan Access Code: [email or web address]    Consulted and Agree with Plan of Care Patient             Patient will benefit from skilled therapeutic intervention in order  to improve the following deficits and impairments:  Difficulty walking, Abnormal gait, Decreased range of motion, Decreased activity tolerance, Pain, Decreased balance, Impaired flexibility, Decreased mobility, Decreased strength, Impaired sensation  Visit Diagnosis: Pain in right ankle and joints of right foot  Stiffness of right ankle, not elsewhere classified  Muscle weakness (generalized)  Other abnormalities of gait and mobility     Problem List Patient Active Problem List   Diagnosis Date Noted   Displaced fracture of lateral malleolus of right fibula, initial encounter for closed fracture     Carmelina Dane, PT, DPT 04/24/21 4:16 PM   [facility] Outpatient Rehabilitation [facility] [address] [place], [place], [address] Phone: [phone]   Fax:  [phone]  Name: Kaydyn Chism MRN: [id number] Date of Birth: [birth year]

## 2021-04-30 ENCOUNTER — Encounter: Payer: Self-pay | Admitting: Orthopaedic Surgery

## 2021-04-30 ENCOUNTER — Other Ambulatory Visit: Payer: Self-pay

## 2021-04-30 ENCOUNTER — Ambulatory Visit (INDEPENDENT_AMBULATORY_CARE_PROVIDER_SITE_OTHER): Payer: 59 | Admitting: Orthopaedic Surgery

## 2021-04-30 ENCOUNTER — Ambulatory Visit (INDEPENDENT_AMBULATORY_CARE_PROVIDER_SITE_OTHER): Payer: 59

## 2021-04-30 DIAGNOSIS — S82891D Other fracture of right lower leg, subsequent encounter for closed fracture with routine healing: Secondary | ICD-10-CM

## 2021-04-30 NOTE — Progress Notes (Signed)
° °  Post-Op Visit Note   Patient: Melinda Padilla           Date of Birth: Jul 26, 1990           MRN: [id number] Visit Date: 04/30/2021 PCP: Patient, No Pcp Per (Inactive)   Assessment & Plan:  Chief Complaint:  Chief Complaint  Patient presents with   Right Ankle - Pain   Visit Diagnoses:  1. Closed fracture of right ankle with routine healing, subsequent encounter     Plan: Patient is a pleasant 31 year old female who comes in today approximately 5 months status post ORIF right lateral malleolus fracture, date of surgery 11/26/2020.  She has been slightly improving but is still complaining of pain to the entire ankle worse with bearing weight.  It appeared that she was developing an Achilles contracture at her previous visit so she has been in physical therapy since and there is making slow but steady progress.  Examination of the right ankle reveals mild medial and lateral sided tenderness.  She is able to get to neutral in a prone position.  Her nonoperative leg can only get to approximately 5 to 7 degrees of dorsiflexion.  At this point, her fracture has healed.  I we would like for her to continue with aggressive physical therapy for her Achilles but do not think at this time she needs surgical intervention.  She will follow-up with [place] as needed.  Of note, she is scheduled to return to work at the end of the month and will call us with the date for [place] to provide her with a note.  Follow-Up Instructions: Return if symptoms worsen or fail to improve.   Orders:  Orders Placed This Encounter  Procedures   XR Ankle Complete Right   No orders of the defined types were placed in this encounter.   Imaging: XR Ankle Complete Right  Result Date: 04/30/2021 Fully healed fracture without hardware complication   PMFS History: Patient Active Problem List   Diagnosis Date Noted   Displaced fracture of lateral malleolus of right fibula, initial encounter for closed fracture    Past Medical  History:  Diagnosis Date   Heart murmur     History reviewed. No pertinent family history.  Past Surgical History:  Procedure Laterality Date   NO PAST SURGERIES     ORIF ANKLE FRACTURE Right 11/26/2020   Procedure: OPEN REDUCTION INTERNAL FIXATION (ORIF) RIGHT LATERAL MALLEOLUS;  Surgeon: Tarry Kos, MD;  Location: [facility];  Service: Orthopedics;  Laterality: Right;   Social History   Occupational History   Not on file  Tobacco Use   Smoking status: Former    Packs/day: 1.00    Types: Cigarettes    Quit date: 08/19/2020    Years since quitting: 0.6   Smokeless tobacco: Never  Vaping Use   Vaping Use: Some days  Substance and Sexual Activity   Alcohol use: No   Drug use: Yes    Types: Marijuana   Sexual activity: Never    Birth control/protection: None

## 2021-05-02 ENCOUNTER — Ambulatory Visit: Payer: 59

## 2021-05-08 ENCOUNTER — Other Ambulatory Visit: Payer: Self-pay

## 2021-05-08 ENCOUNTER — Ambulatory Visit: Payer: 59

## 2021-05-08 DIAGNOSIS — M25571 Pain in right ankle and joints of right foot: Secondary | ICD-10-CM | POA: Diagnosis not present

## 2021-05-08 DIAGNOSIS — R2689 Other abnormalities of gait and mobility: Secondary | ICD-10-CM

## 2021-05-08 DIAGNOSIS — M6281 Muscle weakness (generalized): Secondary | ICD-10-CM

## 2021-05-08 NOTE — Therapy (Signed)
[facility] [address] [place], [place], [address] Phone: [phone]   Fax:  [phone]  Physical Therapy Treatment  Patient Details  Name: Melinda Padilla MRN: [id number] Date of Birth: 09-09-1990 Referring Provider (PT): Cristie Hem, [place]   Encounter Date: 05/08/2021   PT End of Session - 05/08/21 1532     Visit Number 14    Number of Visits 17    Date for PT Re-Evaluation 05/13/21    Authorization Type [employer]    PT Start Time 1535    PT Stop Time 1615    PT Time Calculation (min) 40 min    Activity Tolerance Patient tolerated treatment well    Behavior During Therapy [facility] for tasks assessed/performed             Past Medical History:  Diagnosis Date   Heart murmur     Past Surgical History:  Procedure Laterality Date   NO PAST SURGERIES     ORIF ANKLE FRACTURE Right 11/26/2020   Procedure: OPEN REDUCTION INTERNAL FIXATION (ORIF) RIGHT LATERAL MALLEOLUS;  Surgeon: Tarry Kos, MD;  Location: [facility];  Service: Orthopedics;  Laterality: Right;    There were no vitals filed for this visit.   Subjective Assessment - 05/08/21 1534     Subjective Pt reports no pain today, which she partially attributes to taking Tramadol earlier today. She adds that she has been doing her HEP daily. She also reports icing her ankle every day, which has helped with the swelling.    Limitations Walking;Standing;Lifting;House hold activities    Currently in Pain? No/denies    Pain Score 0-No pain                       [facility] Adult PT Treatment/Exercise:   Therapeutic Exercise: Supine bridge with marching with feet on BOSU ball 2x10 BIL Marching on BOSU ball in // bars 3x20 Tandem stepping on half foam, flat down in // bars 2x20 BIL Heel/ toe rocks on BOSU ball 2x10 Standing gastroc stretch x2 minutes BIL Standing Soleus stretch x2 minutes BIL     Gait Training: Stepping  over yellow hurdles in // bars with emphasis on ankle DF during swing phase of gait x3 laps Toe walking in // bars x3 laps Heel walking in // bars x3 laps                   PT Short Term Goals - 04/02/21 0933       PT SHORT TERM GOAL #1   Title Patient will be independent with initial HEP.    Baseline updating HEP    Time 3    Period Weeks    Status On-going    Target Date 04/22/21      PT SHORT TERM GOAL #2   Title Patient will demonstrate at least 5 degrees of ankle inversion/eversion AROM on the RLE.    Baseline 8d inversion, 6d eversion (01/15/2021)    Status Achieved      PT SHORT TERM GOAL #3   Title Patient will demonstrate at least neutral dorsiflexion to assist in normalizing gait mechanics.    Baseline lacking 15 deg actively    Time 3    Period Weeks    Status On-going    Target Date 04/22/21      PT SHORT TERM GOAL #4   Title Patient will maintain SLS on the RLE for at least  3 seconds to improve stability with gait pattern.    Baseline unable    Time 3    Period Weeks    Status On-going    Target Date 04/22/21               PT Long Term Goals - 04/02/21 0932       PT LONG TERM GOAL #1   Title Patient will demonstrate at least 4+/5 strength in Rt ankle to improve stability with walking/standing activity    Baseline patient demonstrates right ankle strength deficits    Time 6    Period Weeks    Status On-going    Target Date 05/13/21      PT LONG TERM GOAL #2   Title Patient will demonstrate normalized gait pattern with LRAD once cleared by surgeon.    Baseline patient continues to exhibit antalgic gait on right    Time 6    Period Weeks    Status On-going    Target Date 05/13/21      PT LONG TERM GOAL #3   Title Patient will maintain SLS on the RLE for at least 20 seconds to demonstrate increased ankle stability with functional activities.    Baseline unable    Time 6    Period Weeks    Status On-going    Target Date  05/13/21      PT LONG TERM GOAL #4   Title Patient will score at least 67% function on FOTO to signify clinically meaningful improvement in functional abilities.    Baseline not assessed this visit    Time 6    Period Weeks    Status On-going    Target Date 05/13/21                   Plan - 05/08/21 1557     Clinical Impression Statement Pt responded well to all interventions today, demonstrating good form and mild increase in pain with selected interventions. Increased focus was added to calf stretching today prior to gait training and LE strengthening. Pt reports pain with soleus stretching in the front of her ankle, but this subsided after this exercise. She shows improved control of ankle movement during gait, particularly with ankle dorsiflexion during hurdle walking. She will continue to benefit from skilled PT to address her primary impairments and return to her prior level of function with less limitation.    Personal Factors and Comorbidities Fitness    Examination-Activity Limitations Stairs;Stand;Locomotion Level;Transfers;Squat;Bend;Lift;Carry    Examination-Participation Restrictions Meal Prep;Driving;Cleaning;Community Activity;Shop;Laundry    Stability/Clinical Decision Making Stable/Uncomplicated    Clinical Decision Making Low    Rehab Potential Good    PT Frequency 1x / week    PT Duration 6 weeks    PT Treatment/Interventions ADLs/Self Care Home Management;Cryotherapy;Electrical Stimulation;Moist Heat;Gait training;Stair training;Functional mobility training;Therapeutic activities;Therapeutic exercise;Balance training;Neuromuscular re-education;Patient/family education;Manual techniques;Passive range of motion;Dry needling;Taping;Vasopneumatic Device    PT Next Visit Plan manual to calf / ankle to improve DF, MWM using strap for ankle DF, closed chain ankle ROM/ strengthening/ weight shift as able, gait    PT Home Exercise Plan Access Code: [email or web address]    Consulted  and Agree with Plan of Care Patient             Patient will benefit from skilled therapeutic intervention in order to improve the following deficits and impairments:  Difficulty walking, Abnormal gait, Decreased range of motion, Decreased activity tolerance, Pain, Decreased balance, Impaired flexibility, Decreased mobility, Decreased strength, Impaired  sensation  Visit Diagnosis: Pain in right ankle and joints of right foot  Muscle weakness (generalized)  Other abnormalities of gait and mobility     Problem List Patient Active Problem List   Diagnosis Date Noted   Displaced fracture of lateral malleolus of right fibula, initial encounter for closed fracture     Carmelina Dane, PT, DPT 05/08/21 4:13 PM   [facility] [address] [place], [place], [address] Phone: [phone]   Fax:  [phone]  Name: Melinda Padilla MRN: [id number] Date of Birth: 1990/08/29

## 2021-05-09 ENCOUNTER — Ambulatory Visit: Payer: 59

## 2021-05-09 DIAGNOSIS — M25571 Pain in right ankle and joints of right foot: Secondary | ICD-10-CM

## 2021-05-09 DIAGNOSIS — M6281 Muscle weakness (generalized): Secondary | ICD-10-CM

## 2021-05-09 DIAGNOSIS — R2689 Other abnormalities of gait and mobility: Secondary | ICD-10-CM

## 2021-05-09 NOTE — Therapy (Addendum)
[place], [place], [address] Phone: [phone]   Fax:  [phone]  Physical Therapy Treatment/ Re-Certification/DC Summary  Patient Details  Name: Melinda Padilla MRN: [id number] Date of Birth: 10-27-1990 Referring Provider (PT): Aundra Dubin, [place]   Encounter Date: 05/09/2021   PT End of Session - 05/09/21 1755     Visit Number 15    Number of Visits 23    Date for PT Re-Evaluation 06/06/21    Authorization Type [employer]    PT Start Time 9983   pt arrived 10 minutes late to her appointment.   PT Stop Time 1830    PT Time Calculation (min) 32 min    Activity Tolerance Patient tolerated treatment well    Behavior During Therapy WFL for tasks assessed/performed             Past Medical History:  Diagnosis Date   Heart murmur     Past Surgical History:  Procedure Laterality Date   NO PAST SURGERIES     ORIF ANKLE FRACTURE Right 11/26/2020   Procedure: OPEN REDUCTION INTERNAL FIXATION (ORIF) RIGHT LATERAL MALLEOLUS;  Surgeon: Leandrew Koyanagi, MD;  Location: [facility];  Service: Orthopedics;  Laterality: Right;    There were no vitals filed for this visit.   Subjective Assessment - 05/09/21 1759     Subjective Pt reports 3/10 Rt foot/ ankle pain today, which has been helped by taking tramadol.    How long can you stand comfortably? 10 minutes    How long can you walk comfortably? 10 minutes    Currently in Pain? Yes    Pain Score 3     Pain Location Ankle    Pain Orientation Right    Pain Descriptors / Indicators Tightness    Pain Type Chronic pain;Surgical pain    Pain Onset More than a month ago    Pain Frequency Intermittent                [facility] PT Assessment - 05/09/21 0001       Observation/Other Assessments   Focus on Therapeutic Outcomes (FOTO)  51%      Functional Tests   Functional tests Single leg stance      Single Leg Stance    Comments 5 seconds before step error on Rt      AROM   Right Ankle Dorsiflexion -3    Right Ankle Plantar Flexion 55    Right Ankle Inversion 18    Right Ankle Eversion 15      PROM   Right Ankle Dorsiflexion 0      Strength   Right Ankle Dorsiflexion 5/5    Right Ankle Plantar Flexion 5/5    Right Ankle Inversion 5/5    Right Ankle Eversion 5/5                      [facility] Adult PT Treatment:                                                DATE: 05/09/2021 Therapeutic Exercise: Tandem walking with forward/ backward weight-shifting in // bars x3 laps Mini-squat side stepping in // bars x3 laps Standing heel toe rocks in // bars 3x10 Manual Therapy: N/A Neuromuscular re-ed: N/A Therapeutic Activity: Pt education on  objective findings in her re-eval, updated POC Modalities: N/A Self Care: N/A               PT Education - 05/09/21 1811     Education Details Pt educated on updated objective measures, POC    Person(s) Educated Patient    Methods Explanation    Comprehension Verbalized understanding              PT Short Term Goals - 05/09/21 1809       PT SHORT TERM GOAL #1   Title Patient will be independent with initial HEP.    Baseline Pt reports independence with her HEP    Time 3    Period Weeks    Status Achieved    Target Date 04/22/21      PT SHORT TERM GOAL #2   Title Patient will demonstrate at least 5 degrees of ankle inversion/eversion AROM on the RLE.    Baseline 8d inversion, 6d eversion (01/15/2021)    Status Achieved      PT SHORT TERM GOAL #3   Title Patient will demonstrate at least neutral dorsiflexion to assist in normalizing gait mechanics.    Baseline lacking 15 deg actively; lacking 3 degrees actively (05/09/2020)    Time 3    Period Weeks    Status On-going    Target Date 04/22/21      PT SHORT TERM GOAL #4   Title Patient will maintain SLS on the RLE for at least 3 seconds to improve stability with gait  pattern.    Baseline unable; 5 seconds (05/09/2021)    Time 3    Period Weeks    Status Achieved    Target Date 04/22/21               PT Long Term Goals - 05/09/21 1810       PT LONG TERM GOAL #1   Title Patient will demonstrate at least 4+/5 strength in Rt ankle to improve stability with walking/standing activity    Baseline patient demonstrates right ankle strength deficits; achieved 05/09/2020    Time 6    Period Weeks    Status Achieved    Target Date 05/13/21      PT LONG TERM GOAL #2   Title Patient will demonstrate normalized gait pattern with LRAD once cleared by surgeon.    Baseline patient continues to exhibit antalgic gait on right with Rt lower leg ER    Time 6    Period Weeks    Status On-going    Target Date 05/13/21      PT LONG TERM GOAL #3   Title Patient will maintain SLS on the RLE for at least 20 seconds to demonstrate increased ankle stability with functional activities.    Baseline unable; 5 seconds on Rt    Time 6    Period Weeks    Status On-going    Target Date 05/13/21      PT LONG TERM GOAL #4   Title Patient will score at least 67% function on FOTO to signify clinically meaningful improvement in functional abilities.    Baseline 51% (05/09/2021)    Time 6    Period Weeks    Status On-going    Target Date 05/13/21      PT LONG TERM GOAL #5   Title Added 05/09/2021: Pt will report ability to walk 20 minutes without seated rest in order to grocery shop with less limitation.    Baseline  10 minutes    Time 4    Period Weeks    Status New    Target Date 06/06/21                   Plan - 05/09/21 1812     Clinical Impression Statement Pt arrived 10 minutes late to her appointment, which led to a truncated treatment session. Upon re-assessment, pt has made good progress in global ankle strength, ankle DF AROM, standing/walking tolerance, SLS, and FOTO score. She is still limited in ankle DF AROM, SLS, and functional walking  ability. Following education on new objective measures, the pt responded well to selected exercises today, demonstrating  good form and no increase in pain. She will continue to benefit from skilled PT to address her primary impairments and return to her prior level of function with less limitation.    Personal Factors and Comorbidities Fitness    Examination-Activity Limitations Stairs;Stand;Locomotion Level;Transfers;Squat;Bend;Lift;Carry    Examination-Participation Restrictions Meal Prep;Driving;Cleaning;Community Activity;Shop;Laundry    Stability/Clinical Decision Making Stable/Uncomplicated    Clinical Decision Making Low    Rehab Potential Good    PT Frequency 1x / week    PT Duration 6 weeks    PT Treatment/Interventions ADLs/Self Care Home Management;Cryotherapy;Electrical Stimulation;Moist Heat;Gait training;Stair training;Functional mobility training;Therapeutic activities;Therapeutic exercise;Balance training;Neuromuscular re-education;Patient/family education;Manual techniques;Passive range of motion;Dry needling;Taping;Vasopneumatic Device    PT Next Visit Plan manual to calf / ankle to improve DF, MWM using strap for ankle DF, closed chain ankle ROM/ strengthening/ weight shift as able, gait, continue calf stretching    PT Home Exercise Plan Access Code: [email or web address]    Consulted and Agree with Plan of Care Patient             Patient will benefit from skilled therapeutic intervention in order to improve the following deficits and impairments:  Difficulty walking, Abnormal gait, Decreased range of motion, Decreased activity tolerance, Pain, Decreased balance, Impaired flexibility, Decreased mobility, Decreased strength, Impaired sensation  Visit Diagnosis: Pain in right ankle and joints of right foot  Muscle weakness (generalized)  Other abnormalities of gait and mobility     Problem List Patient Active Problem List   Diagnosis Date Noted   Displaced fracture of lateral  malleolus of right fibula, initial encounter for closed fracture    PHYSICAL THERAPY DISCHARGE SUMMARY  Visits from Start of Care:   Current functional level related to goals / functional outcomes: UTA   Remaining deficits: UTA   Education / Equipment: HEP   Patient agrees to discharge. Patient goals were partially met. Patient is being discharged due to not returning since the last visit.  Carolann Littler [place], PT 05/09/2021, 6:23 PM Leroy Sea PT  [facility] [address] [place], [place], [address] Phone: [phone]   Fax:  [phone]  Name: Melinda Padilla MRN: [id number] Date of Birth: 01-Nov-1990

## 2021-05-14 ENCOUNTER — Ambulatory Visit: Payer: 59

## 2021-05-21 ENCOUNTER — Encounter: Payer: 59 | Admitting: Physical Therapy

## 2021-05-21 ENCOUNTER — Telehealth: Payer: Self-pay | Admitting: Physical Therapy

## 2021-05-21 NOTE — Telephone Encounter (Signed)
Called and informed patient of missed visit and provided reminder of next appt and attendance policy.  

## 2021-05-23 ENCOUNTER — Ambulatory Visit: Payer: 59 | Attending: Physician Assistant

## 2021-05-28 ENCOUNTER — Ambulatory Visit: Payer: 59

## 2021-05-30 ENCOUNTER — Ambulatory Visit: Payer: 59

## 2021-08-29 ENCOUNTER — Telehealth: Payer: Self-pay

## 2021-08-29 ENCOUNTER — Other Ambulatory Visit: Payer: Self-pay

## 2021-08-29 ENCOUNTER — Telehealth: Payer: Self-pay | Admitting: Orthopaedic Surgery

## 2021-08-29 NOTE — Telephone Encounter (Signed)
Yes that is fine

## 2021-08-29 NOTE — Telephone Encounter (Signed)
Work note made. ? ?

## 2021-08-29 NOTE — Telephone Encounter (Signed)
Patient needs a doctors note saying she can return back to work and can stand for 8 hours and can bare weight on her ankle. Call when ready. If possible can send via MyChart patient is scheduled to work at 11 pm tonight  ?

## 2021-08-29 NOTE — Telephone Encounter (Signed)
Note made and sent her mychart msg. ? ?

## 2021-08-29 NOTE — Telephone Encounter (Signed)
Okay for note

## 2021-09-03 ENCOUNTER — Ambulatory Visit: Payer: 59 | Admitting: Dermatology

## 2021-09-19 ENCOUNTER — Telehealth: Payer: Self-pay | Admitting: Orthopaedic Surgery

## 2021-09-19 NOTE — Telephone Encounter (Signed)
That's fine for 3 months.

## 2021-09-19 NOTE — Telephone Encounter (Signed)
Patient called asked if she can get a note for her employer stating she can park near the building. Patient is employed with [employer].    Patient asked if she can get a note also stating she can sit down and take a short break taking the pressure off of her right ankle? The number to contact patient is [phone]

## 2021-09-19 NOTE — Telephone Encounter (Signed)
Called and spoke with patient. She wanted the letter routed to her MyChart. It has been sent.

## 2021-09-27 NOTE — Telephone Encounter (Signed)
Patient friend dropped off Handicap paperwork to be filled out for temporary handicap parking decal.

## 2021-09-30 NOTE — Telephone Encounter (Signed)
Thanks lauren  Lovena Le, I filled this out and put on your desk

## 2022-04-08 ENCOUNTER — Ambulatory Visit (INDEPENDENT_AMBULATORY_CARE_PROVIDER_SITE_OTHER): Payer: Medicaid Other

## 2022-04-08 ENCOUNTER — Encounter: Payer: Self-pay | Admitting: Orthopaedic Surgery

## 2022-04-08 ENCOUNTER — Ambulatory Visit (INDEPENDENT_AMBULATORY_CARE_PROVIDER_SITE_OTHER): Payer: Medicaid Other | Admitting: Orthopaedic Surgery

## 2022-04-08 DIAGNOSIS — M25571 Pain in right ankle and joints of right foot: Secondary | ICD-10-CM | POA: Diagnosis not present

## 2022-04-08 DIAGNOSIS — S82891D Other fracture of right lower leg, subsequent encounter for closed fracture with routine healing: Secondary | ICD-10-CM

## 2022-04-08 MED ORDER — METHYLPREDNISOLONE ACETATE 40 MG/ML IJ SUSP
40.0000 mg | INTRAMUSCULAR | Status: AC | PRN
Start: 2022-04-08 — End: 2022-04-08
  Administered 2022-04-08: 40 mg via INTRA_ARTICULAR

## 2022-04-08 MED ORDER — LIDOCAINE HCL 1 % IJ SOLN
1.0000 mL | INTRAMUSCULAR | Status: AC | PRN
  Administered 2022-04-08: 1 mL

## 2022-04-08 MED ORDER — BUPIVACAINE HCL 0.5 % IJ SOLN
1.0000 mL | INTRAMUSCULAR | Status: AC | PRN
Start: 2022-04-08 — End: 2022-04-08
  Administered 2022-04-08: 1 mL via INTRA_ARTICULAR

## 2022-04-08 NOTE — Progress Notes (Signed)
Office Visit Note   Patient: Melinda Padilla           Date of Birth: 08-03-1990           MRN: [id number] Visit Date: 04/08/2022              Requested by: No referring provider defined for this encounter. PCP: Patient, No Pcp Per   Assessment & Plan: Visit Diagnoses:  1. Pain in right ankle and joints of right foot   2. Closed fracture of right ankle with routine healing, subsequent encounter     Plan: Patient is reporting posterior and medial ankle pain that is worse with activity.  This could be related to posttraumatic arthritis.  Patient is also very anxious about her symptoms.  Reassurance was provided that there are no structural abnormalities that I can find.  Based on findings I have recommended an ankle injection and activity modification and relative rest to see how she responds to this.  She should follow-up if symptoms persist.  Follow-Up Instructions: No follow-ups on file.   Orders:  Orders Placed This Encounter  Procedures   XR Ankle Complete Right   No orders of the defined types were placed in this encounter.     Procedures: Medium Joint Inj: R ankle on 04/08/2022 7:28 PM Indications: pain Details: 25 G needle Medications: 1 mL lidocaine 1 %; 40 mg methylPREDNISolone acetate 40 MG/ML; 1 mL bupivacaine 0.5 % Outcome: tolerated well, no immediate complications Patient was prepped and draped in the usual sterile fashion.       Clinical Data: No additional findings.   Subjective: Chief Complaint  Patient presents with   Right Ankle - Pain    HPI Melinda Padilla is a 31 year old female who underwent ORIF right lateral malleolus fracture on 11/26/2020.  She has been back to work for some time and noticed that she is having trouble with standing on her feet and doing physical job.  Reports generalized ankle swelling.  Review of Systems  Constitutional: Negative.   HENT: Negative.    Eyes: Negative.   Respiratory: Negative.    Cardiovascular: Negative.    Endocrine: Negative.   Musculoskeletal: Negative.   Neurological: Negative.   Hematological: Negative.   Psychiatric/Behavioral: Negative.    All other systems reviewed and are negative.    Objective: Vital Signs: There were no vitals taken for this visit.  Physical Exam Vitals and nursing note reviewed.  Constitutional:      Appearance: She is well-developed.  HENT:     Head: Normocephalic and atraumatic.  Pulmonary:     Effort: Pulmonary effort is normal.  Abdominal:     Palpations: Abdomen is soft.  Musculoskeletal:     Cervical back: Neck supple.  Skin:    General: Skin is warm.     Capillary Refill: Capillary refill takes less than 2 seconds.  Neurological:     Mental Status: She is alert and oriented to person, place, and time.  Psychiatric:        Behavior: Behavior normal.        Thought Content: Thought content normal.        Judgment: Judgment normal.     Ortho Exam Examination of right ankle shows no significant swelling.  She has a fully healed surgical scar.  Passive dorsiflexion to 5 degrees and normal plantarflexion. Specialty Comments:  No specialty comments available.  Imaging: XR Ankle Complete Right  Result Date: 04/08/2022 Three-view x-rays of the right ankle shows a fully  healed fibula fracture with intact hardware.  Tarsal and talar head bossing.  Mild degenerative changes of the ankle joint.    PMFS History: Patient Active Problem List   Diagnosis Date Noted   Displaced fracture of lateral malleolus of right fibula, initial encounter for closed fracture    Past Medical History:  Diagnosis Date   Heart murmur     No family history on file.  Past Surgical History:  Procedure Laterality Date   NO PAST SURGERIES     ORIF ANKLE FRACTURE Right 11/26/2020   Procedure: OPEN REDUCTION INTERNAL FIXATION (ORIF) RIGHT LATERAL MALLEOLUS;  Surgeon: Tarry Kos, MD;  Location: [facility];  Service: Orthopedics;  Laterality: Right;    Social History   Occupational History   Not on file  Tobacco Use   Smoking status: Former    Packs/day: 1.00    Types: Cigarettes    Quit date: 08/19/2020    Years since quitting: 1.6   Smokeless tobacco: Never  Vaping Use   Vaping Use: Some days  Substance and Sexual Activity   Alcohol use: No   Drug use: Yes    Types: Marijuana   Sexual activity: Never    Birth control/protection: None

## 2022-06-06 ENCOUNTER — Ambulatory Visit (HOSPITAL_BASED_OUTPATIENT_CLINIC_OR_DEPARTMENT_OTHER): Payer: Medicaid Other | Admitting: Orthopaedic Surgery

## 2022-06-18 ENCOUNTER — Other Ambulatory Visit (HOSPITAL_BASED_OUTPATIENT_CLINIC_OR_DEPARTMENT_OTHER): Payer: Self-pay

## 2022-06-18 ENCOUNTER — Ambulatory Visit (INDEPENDENT_AMBULATORY_CARE_PROVIDER_SITE_OTHER): Payer: Medicaid Other

## 2022-06-18 ENCOUNTER — Ambulatory Visit (INDEPENDENT_AMBULATORY_CARE_PROVIDER_SITE_OTHER): Payer: Medicaid Other | Admitting: Orthopaedic Surgery

## 2022-06-18 DIAGNOSIS — M25571 Pain in right ankle and joints of right foot: Secondary | ICD-10-CM | POA: Diagnosis not present

## 2022-06-18 DIAGNOSIS — M76821 Posterior tibial tendinitis, right leg: Secondary | ICD-10-CM

## 2022-06-18 MED ORDER — METHOCARBAMOL 500 MG PO TABS
500.0000 mg | ORAL_TABLET | Freq: Four times a day (QID) | ORAL | 3 refills | Status: DC
  Filled 2022-06-18: qty 30, 8d supply, fill #0

## 2022-06-18 NOTE — Progress Notes (Signed)
Chief Complaint: Right ankle pain     History of Present Illness:    Melinda Padilla is a 32 y.o. female presents today with right ankle pain.  Of note she is status post right ankle open reduction internal fixation in 2022 by Dr. Erlinda Hong.  She states that the ankle pain she has today is more posterior medial.  She has had this prior to the ankle surgery.  She states that this is really flared up recently.  This is bothering her during most activities.  She has not trialed any modifications of shoewear    Surgical History:   None  PMH/PSH/Family History/Social History/Meds/Allergies:    Past Medical History:  Diagnosis Date   Heart murmur    Past Surgical History:  Procedure Laterality Date   NO PAST SURGERIES     ORIF ANKLE FRACTURE Right 11/26/2020   Procedure: OPEN REDUCTION INTERNAL FIXATION (ORIF) RIGHT LATERAL MALLEOLUS;  Surgeon: Leandrew Koyanagi, MD;  Location: [facility];  Service: Orthopedics;  Laterality: Right;   Social History   Socioeconomic History   Marital status: Single    Spouse name: Not on file   Number of children: Not on file   Years of education: Not on file   Highest education level: Not on file  Occupational History   Not on file  Tobacco Use   Smoking status: Former    Packs/day: 1.00    Types: Cigarettes    Quit date: 08/19/2020    Years since quitting: 1.8   Smokeless tobacco: Never  Vaping Use   Vaping Use: Some days  Substance and Sexual Activity   Alcohol use: No   Drug use: Yes    Types: Marijuana   Sexual activity: Never    Birth control/protection: None  Other Topics Concern   Not on file  Social History Narrative   Not on file   Social Determinants of Health   Financial Resource Strain: Not on file  Food Insecurity: Not on file  Transportation Needs: Not on file  Physical Activity: Not on file  Stress: Not on file  Social Connections: Not on file   No family history on  file. Allergies  Allergen Reactions   Latex Hives   Current Outpatient Medications  Medication Sig Dispense Refill   methocarbamol (ROBAXIN) 500 MG tablet Take 1 tablet (500 mg total) by mouth 4 (four) times daily. 30 tablet 3   aspirin EC 81 MG tablet Take 1 tablet (81 mg total) by mouth 2 (two) times daily. 28 tablet 0   calcium-vitamin D (OSCAL WITH D) 500-200 MG-UNIT tablet Take 1 tablet by mouth 3 (three) times daily. 90 tablet 6   HYDROcodone-acetaminophen (NORCO) 5-325 MG tablet Take 1 tablet by mouth 3 (three) times daily as needed. 30 tablet 0   methocarbamol (ROBAXIN) 500 MG tablet Take 1 tablet (500 mg total) by mouth 2 (two) times daily as needed. 30 tablet 6   oxyCODONE-acetaminophen (PERCOCET/ROXICET) 5-325 MG tablet Take 1 tablet by mouth every 8 (eight) hours as needed for severe pain. 20 tablet 0   traMADol (ULTRAM) 50 MG tablet Take 1-2 tablets (50-100 mg total) by mouth daily as needed. 30 tablet 2   zinc sulfate 220 (50 Zn) MG capsule Take 1 capsule (220 mg total) by mouth daily. 42 capsule 0  No current facility-administered medications for this visit.   No results found.  Review of Systems:   A ROS was performed including pertinent positives and negatives as documented in the HPI.  Physical Exam :   Constitutional: NAD and appears stated age Neurological: Alert and oriented Psych: Appropriate affect and cooperative There were no vitals taken for this visit.   Comprehensive Musculoskeletal Exam:    Tenderness palpation about the posterior medial PT tendon.  There is some flatfootedness from evaluating her arch.  She does not have any tenderness about the ankle joint around the fibula.  Remainder of distal neurosensory exam is intact  Imaging:   Xray (3 views right ankle): Status post open reduction internal fixation with a now healed fibula there is some degenerative findings about the ankle joint    I personally reviewed and interpreted the  radiographs.   Assessment:   32 y.o. female with right posterior medial ankle pain consistent with PT tendinitis.  I described that this is likely in the setting of a flat-foot at baseline.  I did discuss treatment options and have initially recommended conservative management with an arch support.  I do believe she may also benefit from referral and discussion with Dr. Rolena Infante for some pulse shock therapy about the PT tendon.  I will plan to see her back as needed  Plan :    -Return to clinic as needed     I personally saw and evaluated the patient, and participated in the management and treatment plan.  Vanetta Mulders, MD Attending Physician, Orthopedic Surgery  This document was dictated using Dragon voice recognition software. A reasonable attempt at proof reading has been made to minimize errors.

## 2022-06-26 ENCOUNTER — Ambulatory Visit (INDEPENDENT_AMBULATORY_CARE_PROVIDER_SITE_OTHER): Payer: Medicaid Other | Admitting: Sports Medicine

## 2022-06-26 ENCOUNTER — Encounter: Payer: Self-pay | Admitting: Sports Medicine

## 2022-06-26 ENCOUNTER — Ambulatory Visit: Payer: Self-pay

## 2022-06-26 DIAGNOSIS — S8261XS Displaced fracture of lateral malleolus of right fibula, sequela: Secondary | ICD-10-CM

## 2022-06-26 DIAGNOSIS — Q666 Other congenital valgus deformities of feet: Secondary | ICD-10-CM | POA: Diagnosis not present

## 2022-06-26 DIAGNOSIS — S82891S Other fracture of right lower leg, sequela: Secondary | ICD-10-CM

## 2022-06-26 DIAGNOSIS — M76821 Posterior tibial tendinitis, right leg: Secondary | ICD-10-CM | POA: Diagnosis not present

## 2022-06-26 DIAGNOSIS — M79604 Pain in right leg: Secondary | ICD-10-CM | POA: Diagnosis not present

## 2022-06-26 MED ORDER — METHYLPREDNISOLONE 4 MG PO TBPK: ORAL_TABLET | ORAL | 0 refills | Status: DC

## 2022-06-26 NOTE — Addendum Note (Signed)
Addended by: Otelia Sergeant on: 06/26/2022 04:41 PM   Modules accepted: Orders

## 2022-06-26 NOTE — Progress Notes (Signed)
[facility] referral for Shockwave  Right medial ankle pain Has had 1 injection- minimal relief Has tried PT/bracing with no relief  Patient was instructed in 10 minutes of therapeutic exercises for right ankle pain to improve strength, ROM and function according to my instructions and plan of care by a Certified Athletic Trainer during the office visit. A customized handout was provided and demonstration of proper technique shown and discussed. Patient did perform exercises and demonstrate understanding through teachback.  All questions discussed and answered.

## 2022-06-26 NOTE — Progress Notes (Signed)
Melinda Padilla - 32 y.o. female MRN [id number]  Date of birth: [birth year]  Office Visit Note: Visit Date: 06/26/2022 PCP: Patient, No Pcp Per Referred by: Vanetta Mulders, MD  Subjective: Chief Complaint  Patient presents with   Right Ankle - Pain   HPI: Melinda Padilla is a pleasant 32 y.o. female who presents today for posterior medial right ankle pain.   Saw Dr. Sammuel Hines on 06/18/22 -this is more so posterior tibial tendon tendinitis. She is status post right ankle open reduction internal fixation in 2022 by Dr. Erlinda Hong. Melinda Padilla has had some pain and stiffness as well as nerve related pain from sequelae from her surgery, however over the last few months she has had worsening posterior medial ankle pain that feels different than her postsurgical issues.  Over the last few weeks her pain has greatly been exacerbated.  She has difficulty going up onto the toes or with any activity quicker than walking.  She has not tried any shoewear modifications.  She does take over-the-counter anti-inflammatories with only minimal relief.  Pertinent ROS were reviewed with the patient and found to be negative unless otherwise specified above in HPI.   Assessment & Plan: Visit Diagnoses:  1. Posterior tibial tendon dysfunction (PTTD) of right lower extremity   2. Pain in right leg   3. Closed fracture of right ankle, sequela   4. Pes planovalgus    Plan: Discussed with At[place] that I do believe she is dealing with posterior tibial dysfunction of the right leg, this is likely a sequelae of her gait abnormality and previous ORIF of the right ankle.  Ultrasound was reassuring against any evidence of tearing of the tendon, there is some very mild fluid within the sheath, although no significant tenosynovitis.  She does have pain with provocative motion and has difficulty going up onto the toes.  We discussed all treatment options such as oral medication therapy, formal rehab versus home therapy, shockwave therapy.   We decided to trial a 6-day course of methylprednisolone taper to help with inflammation and pain control. Trial of extracorporeal shockwave therapy was performed today. My athletic trainer, Lilia Pro, and student ATC, Baltazar Najjar, did review a customized handout for home exercises for the posterior tibial tendon with the patient today.  She will perform these once daily.  I would like to see her back in 1 week for reevaluation, we may consider additional shockwave therapy sessions if she did get good benefit after the 1st-2nd treatment.   Follow-up: Return in about 1 week (around 07/03/2022) for for right ankle.   Meds & Orders: No orders of the defined types were placed in this encounter.   Orders Placed This Encounter  Procedures   [place] Extrem Low Right Ltd     Procedures: Procedure: ECSWT Indications: Posterior tibial dysfunction   Procedure Details Consent: Risks of procedure as well as the alternatives and risks of each were explained to the patient.  Verbal consent for procedure obtained. Time Out: Verified patient identification, verified procedure, site was marked, verified correct patient position. The area was cleaned with alcohol swab.     The right posterior tibial tendon was targeted for Extracorporeal shockwave therapy.    Preset: Tendinitis/tendinopathy Power Level: 50 MJ Frequency: 10 Hz Impulse/cycles: 2000 Head size: Regular   Patient tolerated procedure well without immediate complications.         Clinical History: No specialty comments available.  She reports that she quit smoking about 22 months ago. Her smoking use included cigarettes.  She smoked an average of 1 pack per day. She has never used smokeless tobacco. No results for input(s): "HGBA1C", "LABURIC" in the last 8760 hours.  Objective:    Physical Exam  Gen: Well-appearing, in no acute distress; non-toxic CV: Regular Rate. Well-perfused. Warm.  Resp: Breathing unlabored on room air; no wheezing. Psych:  Fluid speech in conversation; appropriate affect; normal thought process Neuro: Sensation intact throughout. No gross coordination deficits.   Ortho Exam - Right ankle/foot: Inspection of the right ankle demonstrates well-healed incisions from previous right ankle ORIF.  There is positive TTP just off the medial malleolus traversing proximally and distally around the course of the posterior tibial tendon.  Patient in forward flexion and extension of the ankle, likely given ORIF.  There is pain with resisted eversion and inversion stress testing.  Patient has difficulty with plantarflexion going up onto the toes right, as left is intact.  There is bilateral pes planovalgus upon standing.  No overlying redness, no effusion of the ankle.  Imaging: [place] Extrem Low Right Ltd  Result Date: 06/26/2022 Limited MSK US of the right lower extremity, right ankle was performed today. Evaluation of the right medial ankle shows no cortical defect of the medial malleoli.  There is the posterior tibial tendon and flexor digitorum longus just medial to the malleolus without signs of tearing or hypoechoic fluid within the tendon sheath.  The tibial artery and nerve was visualized.  Diameter of the flexor pollicis longus was seen. Posterior tibial tendon was seen in short and long axis following with dynamically into the plantar aspect of the foot.  No evidence of tearing here.  Just proximal to the medial malleoli, there is a very small amount of hypoechoic fluid on the medial aspect of the posterior tibial tendon.         *Right ankle x-ray, 3view from 06/18/22:  *Independent review and interpretation was performed by myself today.  There is a stable previous ORIF of the right distal fibula.  There is also mild degenerative change with spurring of the talonavicular joint and the naviculocuneiform joint. Narrative & Impression  CLINICAL DATA:  Postop pain right ankle   EXAM: RIGHT ANKLE - COMPLETE 3+ VIEW    COMPARISON:  04/08/2022   FINDINGS: Previous ORIF of the right distal fibula. Stable hardware and alignment. No acute osseous finding. Similar degenerative bony spurring of the talus and tarsal bones on the lateral view. No focal soft tissue abnormality.   IMPRESSION: Stable postoperative and degenerative changes as above. No acute finding by plain radiography.     Electronically Signed   By: Jerilynn Mages.  Shick M.D.   On: 06/20/2022 09:58    Past Medical/Family/Surgical/Social History: Medications & Allergies reviewed per EMR, new medications updated. Patient Active Problem List   Diagnosis Date Noted   Displaced fracture of lateral malleolus of right fibula, initial encounter for closed fracture    Past Medical History:  Diagnosis Date   Heart murmur    History reviewed. No pertinent family history. Past Surgical History:  Procedure Laterality Date   NO PAST SURGERIES     ORIF ANKLE FRACTURE Right 11/26/2020   Procedure: OPEN REDUCTION INTERNAL FIXATION (ORIF) RIGHT LATERAL MALLEOLUS;  Surgeon: Leandrew Koyanagi, MD;  Location: [facility];  Service: Orthopedics;  Laterality: Right;   Social History   Occupational History   Not on file  Tobacco Use   Smoking status: Former    Packs/day: 1.00    Types: Cigarettes  Quit date: 08/19/2020    Years since quitting: 1.8   Smokeless tobacco: Never  Vaping Use   Vaping Use: Some days  Substance and Sexual Activity   Alcohol use: No   Drug use: Yes    Types: Marijuana   Sexual activity: Never    Birth control/protection: None

## 2022-07-04 ENCOUNTER — Ambulatory Visit: Payer: Self-pay | Admitting: Nurse Practitioner

## 2022-07-08 ENCOUNTER — Telehealth: Payer: Self-pay | Admitting: Orthopaedic Surgery

## 2022-07-08 ENCOUNTER — Telehealth: Payer: Self-pay | Admitting: Sports Medicine

## 2022-07-08 ENCOUNTER — Encounter: Payer: Self-pay | Admitting: Sports Medicine

## 2022-07-08 ENCOUNTER — Other Ambulatory Visit: Payer: Self-pay | Admitting: Sports Medicine

## 2022-07-08 MED ORDER — METHOCARBAMOL 500 MG PO TABS: 500.0000 mg | ORAL_TABLET | Freq: Three times a day (TID) | ORAL | 1 refills | Status: AC | PRN

## 2022-07-08 MED ORDER — METHYLPREDNISOLONE 4 MG PO TBPK: ORAL_TABLET | ORAL | 0 refills | Status: DC

## 2022-07-08 NOTE — Telephone Encounter (Signed)
Pt called stating she was unable to pick up last script sent in of methyprednisolone, asking for it to be resent. Please send to updated pharmacy [facility] [address]. Phone number is [phone].

## 2022-07-08 NOTE — Progress Notes (Signed)
Received patient call. Sent methylprednisolone and Robaxin medication to the pharmacy of [facility].   Elba Barman, DO Primary Care Sports Medicine Physician  [place]  This note was dictated using Dragon naturally speaking software and may contain errors in syntax, spelling, or content which have not been identified prior to signing this note.

## 2022-07-08 NOTE — Telephone Encounter (Signed)
Pt was unable to pick up last script of methocarbamol to be sent. Asking for medication to go updated pharmacy. Please send to [place]. Pt phone number is [phone].

## 2022-07-30 ENCOUNTER — Ambulatory Visit: Payer: Medicaid Other | Admitting: Family Medicine

## 2022-08-08 IMAGING — CR DG ANKLE COMPLETE 3+V*R*
3 series · 3 of 3 positions shown · non-contrast
Comparison: None.

CLINICAL DATA: Status post fall swelling.  Unable to bear weight.

EXAM:
RIGHT ANKLE - COMPLETE 3+ VIEW

[x ankle lat right]
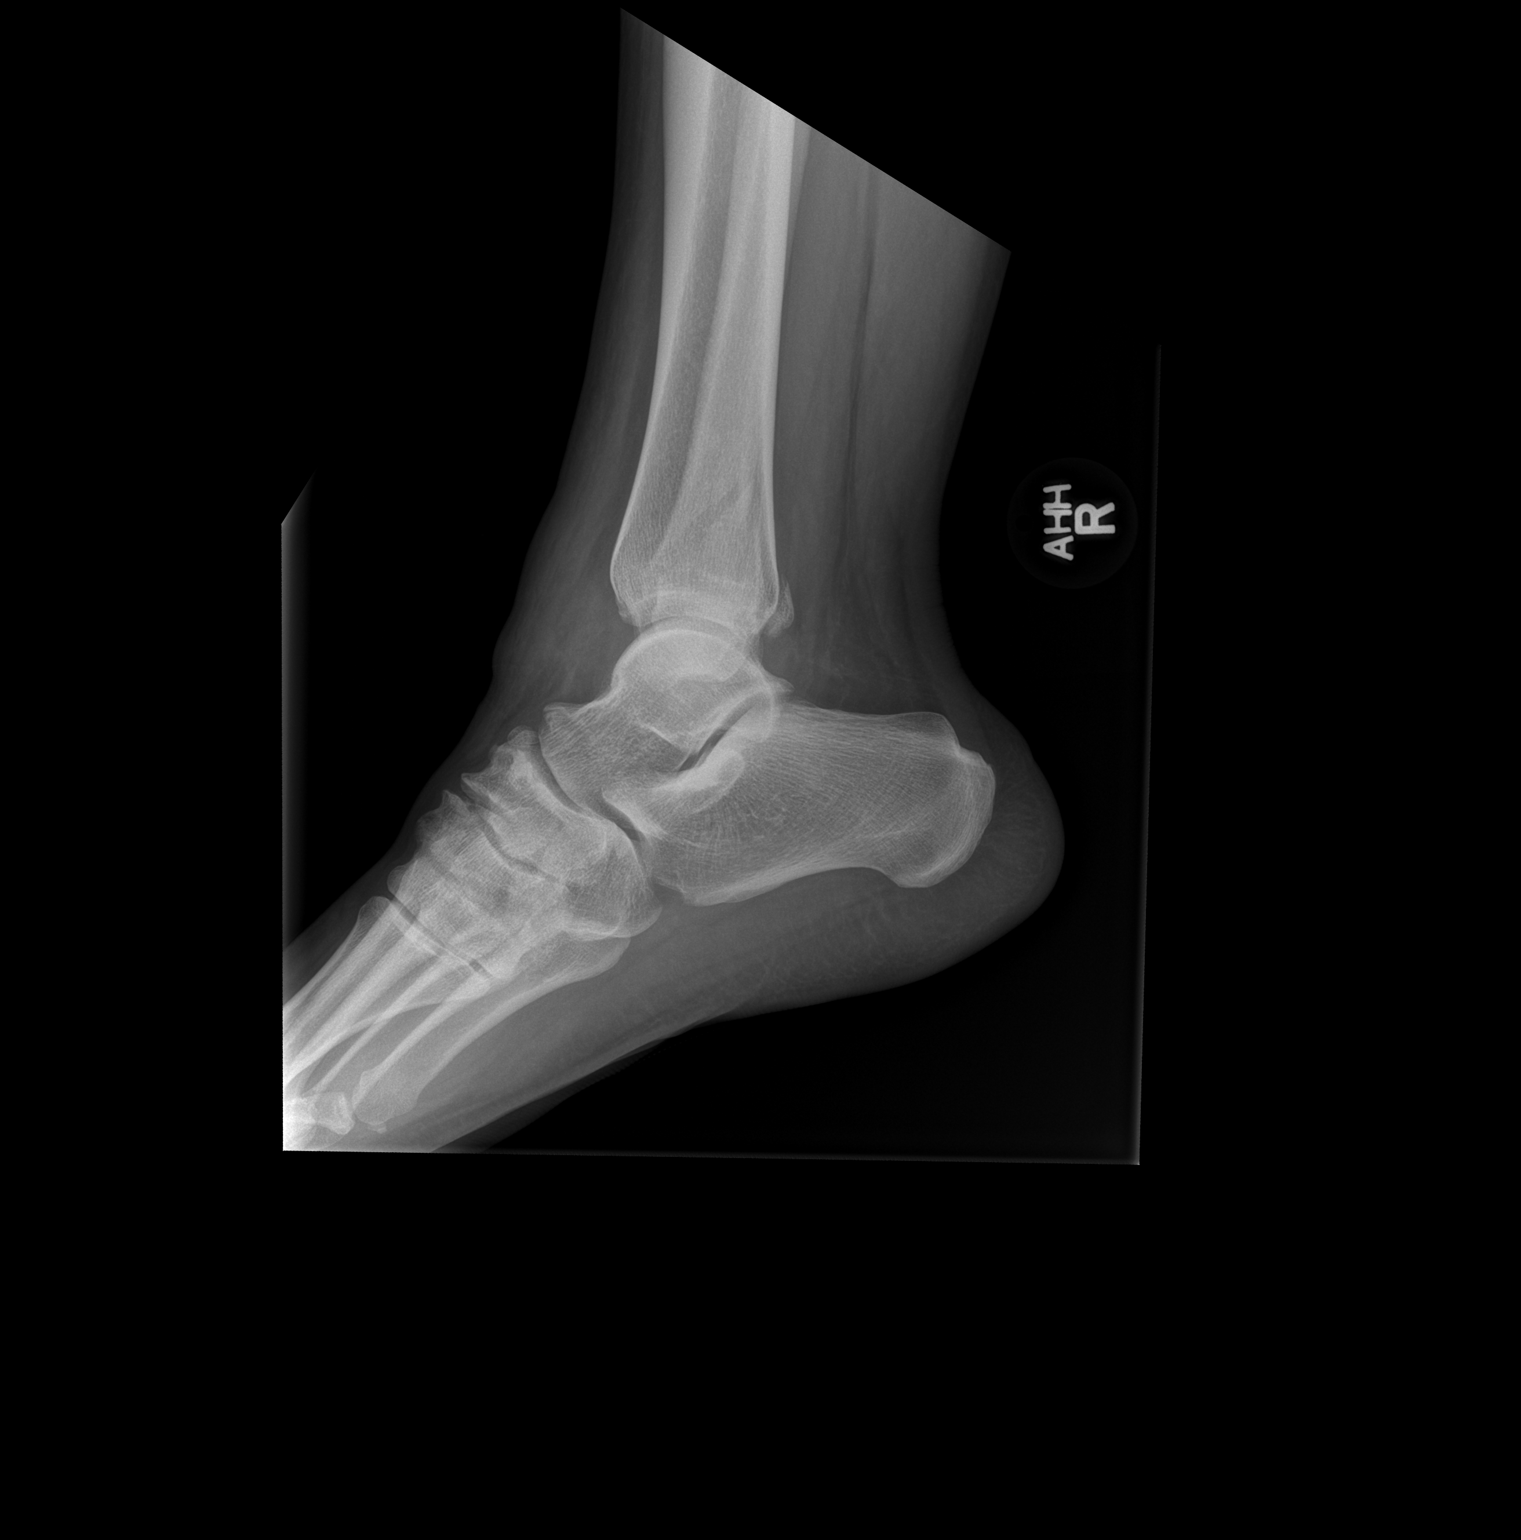

[x ankle ap right]
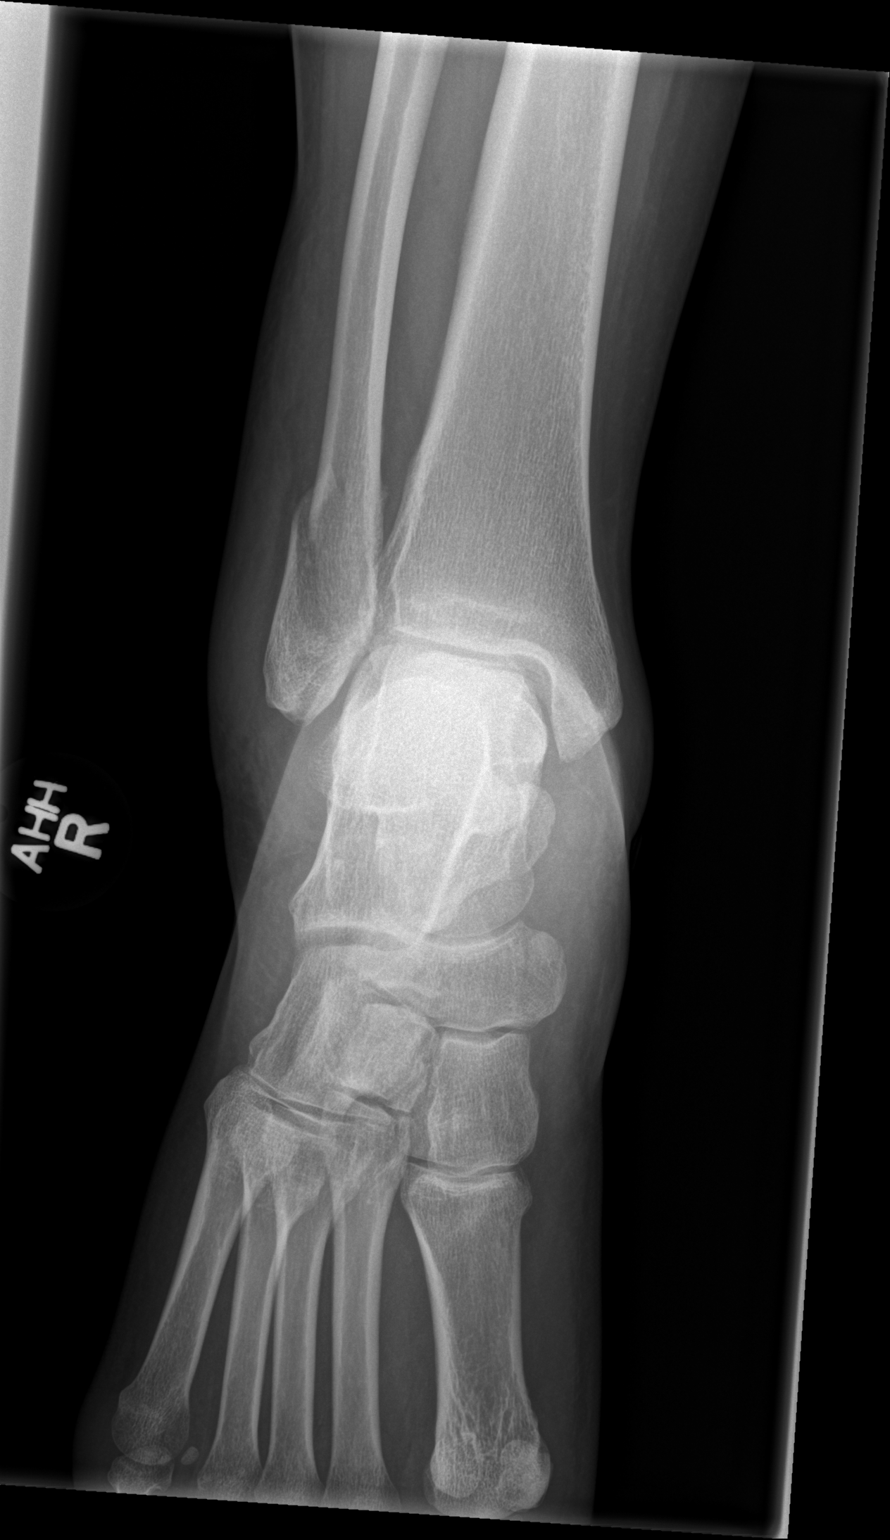

[x ankle obl right]
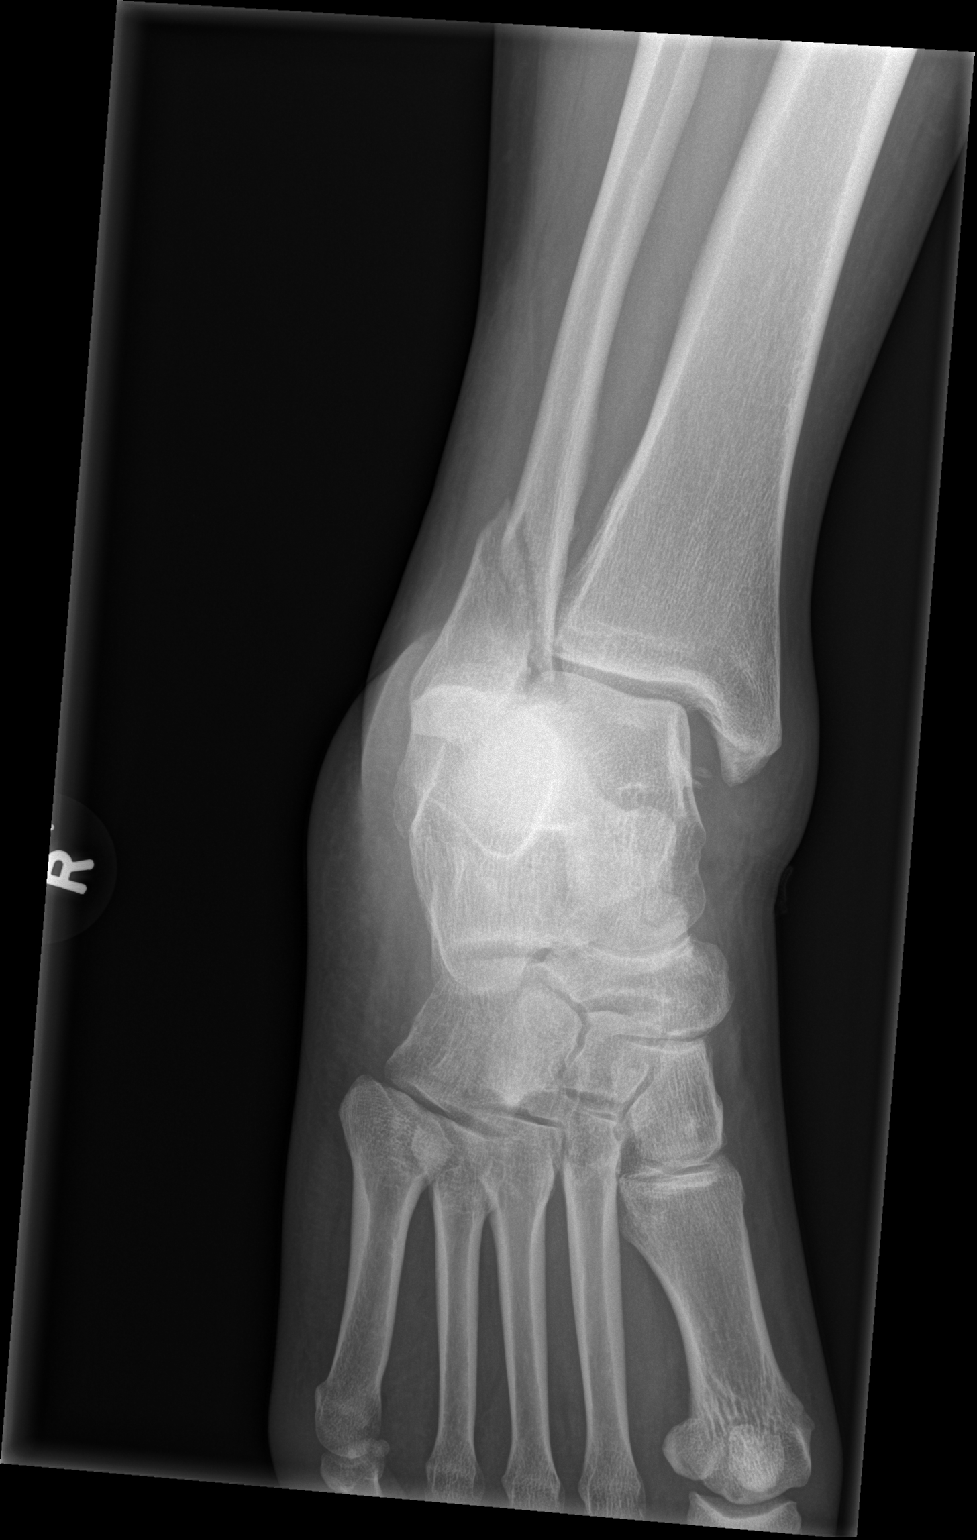

[3 of 3 positions shown; findings below may reference images not displayed]

FINDINGS: Transsyndesmotic distal fibular laterally and posteriorly displaced
comminuted fracture. Associated widening of the medial clear space.
No definite posterior malleolar fracture. Ankle effusion. Associated
subcutaneus soft tissue edema.
IMPRESSION: 1. Acute displaced, comminuted transsyndesmotic distal fibular
fracture.
2. Widening of the medial clear space suggestive of medial
collateral ligament disruption.

## 2022-08-19 DIAGNOSIS — R040 Epistaxis: Secondary | ICD-10-CM | POA: Insufficient documentation

## 2022-08-21 ENCOUNTER — Ambulatory Visit (INDEPENDENT_AMBULATORY_CARE_PROVIDER_SITE_OTHER): Payer: Medicaid Other | Admitting: Family Medicine

## 2022-08-21 ENCOUNTER — Encounter: Payer: Self-pay | Admitting: Family Medicine

## 2022-08-21 VITALS — BP 118/80 | HR 82 | Temp 97.8°F | Ht 67.6 in | Wt 237.0 lb

## 2022-08-21 DIAGNOSIS — F17200 Nicotine dependence, unspecified, uncomplicated: Secondary | ICD-10-CM

## 2022-08-21 DIAGNOSIS — Z6836 Body mass index (BMI) 36.0-36.9, adult: Secondary | ICD-10-CM

## 2022-08-21 DIAGNOSIS — Z1159 Encounter for screening for other viral diseases: Secondary | ICD-10-CM

## 2022-08-21 DIAGNOSIS — Z01419 Encounter for gynecological examination (general) (routine) without abnormal findings: Secondary | ICD-10-CM

## 2022-08-21 DIAGNOSIS — Z0000 Encounter for general adult medical examination without abnormal findings: Secondary | ICD-10-CM | POA: Diagnosis not present

## 2022-08-21 DIAGNOSIS — D229 Melanocytic nevi, unspecified: Secondary | ICD-10-CM | POA: Diagnosis not present

## 2022-08-21 DIAGNOSIS — E6609 Other obesity due to excess calories: Secondary | ICD-10-CM

## 2022-08-21 DIAGNOSIS — Z23 Encounter for immunization: Secondary | ICD-10-CM | POA: Diagnosis not present

## 2022-08-21 DIAGNOSIS — Z0001 Encounter for general adult medical examination with abnormal findings: Secondary | ICD-10-CM

## 2022-08-21 DIAGNOSIS — Z114 Encounter for screening for human immunodeficiency virus [HIV]: Secondary | ICD-10-CM

## 2022-08-21 DIAGNOSIS — J310 Chronic rhinitis: Secondary | ICD-10-CM

## 2022-08-21 DIAGNOSIS — Z1339 Encounter for screening examination for other mental health and behavioral disorders: Secondary | ICD-10-CM

## 2022-08-21 LAB — POC INFLUENZA A&B (BINAX/QUICKVUE)
Influenza A, POC: NEGATIVE
Influenza B, POC: NEGATIVE

## 2022-08-21 LAB — POC COVID19 BINAXNOW: SARS Coronavirus 2 Ag: NEGATIVE

## 2022-08-21 NOTE — Progress Notes (Signed)
I,Jameka J Llittleton,acting as a Neurosurgeon for [employer], NP.,have documented all relevant documentation on the behalf of Zuzanna Maroney, NP,as directed by  Alexza Norbeck Moshe Salisbury, NP while in the presence of Trevonne Nyland, NP.   Subjective:     Patient ID: Melinda Padilla , female    DOB: 1990/11/10 , 32 y.o.   MRN: [id number]   Chief Complaint  Patient presents with   Establish Care   Annual Exam    HPI  Patient presents today to establish primary care. She reports it has been a while since she has seen a provider. Patient would like to have a physical done today. She would also like to be referred to a GYN provider. Patient would like for you to look at her nose.  Sinus Problem This is a new (Patient states she just had an "something done  to my nose 2 days ago" by ENT for prolonged nose bleeding and she has felt weird since. Her Congestion has worsened , she  has post nasal drip and coughs also. She wants to be tested for  Influenza and COVID) problem. The current episode started in the past 7 days. The problem has been waxing and waning since onset. There has been no fever. The pain is mild. Associated symptoms include congestion, coughing and sneezing. Pertinent negatives include no chills. Past treatments include oral decongestants (just once). The treatment provided no relief.     Past Medical History:  Diagnosis Date   Heart murmur      Family History  Problem Relation Age of Onset   Diabetes Mother    Cancer Mother    Healthy Father    Heart failure Paternal Grandmother    Kidney failure Paternal Grandmother    Cancer Paternal Grandfather      Current Outpatient Medications:    methocarbamol (ROBAXIN) 500 MG tablet, Take 500 mg by mouth 4 (four) times daily., Disp: , Rfl:    Allergies  Allergen Reactions   Latex Hives       Social History   Tobacco Use  Smoking Status Every Day   Packs/day: .5   Types: Cigarettes  Smokeless Tobacco Never  . She has been exposed to  passive smoke. The patient's alcohol use is:  Social History   Substance and Sexual Activity  Alcohol Use No  .  Review of Systems  Constitutional:  Positive for fatigue. Negative for chills.  HENT:  Positive for congestion and sneezing.   Eyes: Negative.   Respiratory:  Positive for cough.   Cardiovascular: Negative.   Gastrointestinal: Negative.   Endocrine: Negative.   Genitourinary: Negative.   Musculoskeletal: Negative.   Skin:  Positive for color change.       Mole on her abdomen, patient states it has increased in size and shape and bleeds sometimes  Allergic/Immunologic: Negative.   Neurological: Negative.   Hematological: Negative.   Psychiatric/Behavioral: Negative.       Today's Vitals   08/21/22 1001  Temp: 97.8 F (36.6 C)  Weight: 237 lb (107.5 kg)  Height: 5' 7.6" (1.717 m)   Body mass index is 36.46 kg/m.   Objective:  Physical Exam HENT:     Nose: Congestion and rhinorrhea present.  Abdominal:     General: Bowel sounds are normal.  Skin:    Findings: Lesion present.  Neurological:     Mental Status: She is alert.         Assessment And Plan:     1. Encounter to establish  care with new doctor  2. Encounter for general adult medical examination w/o abnormal findings  3. Encounter for screening for human immunodeficiency virus (HIV)  4. Encounter for hepatitis C screening test for low risk patient  5. Immunization due     Patient was given opportunity to ask questions. Patient verbalized understanding of the plan and was able to repeat key elements of the plan. All questions were answered to their satisfaction.   Patrecia Pour Llittleton, CMA   I, Patrecia Pour Llittleton, CMA, have reviewed all documentation for this visit. The documentation on 08/21/22 for the exam, diagnosis, procedures, and orders are all accurate and complete.   THE PATIENT IS ENCOURAGED TO PRACTICE SOCIAL DISTANCING DUE TO THE COVID-19 PANDEMIC.

## 2022-08-21 NOTE — Patient Instructions (Signed)

## 2022-08-22 LAB — CBC
Hematocrit: 43.3 % (ref 34.0–46.6)
Hemoglobin: 14.5 g/dL (ref 11.1–15.9)
MCH: 30.5 pg (ref 26.6–33.0)
MCHC: 33.5 g/dL (ref 31.5–35.7)
MCV: 91 fL (ref 79–97)
Platelets: 202 10*3/uL (ref 150–450)
RBC: 4.76 x10E6/uL (ref 3.77–5.28)
RDW: 12.4 % (ref 11.7–15.4)
WBC: 6.5 10*3/uL (ref 3.4–10.8)

## 2022-08-22 LAB — CMP14+EGFR
ALT: 13 IU/L (ref 0–32)
AST: 23 IU/L (ref 0–40)
Albumin/Globulin Ratio: 1.6 (ref 1.2–2.2)
Albumin: 4.4 g/dL (ref 3.9–4.9)
Alkaline Phosphatase: 54 IU/L (ref 44–121)
BUN/Creatinine Ratio: 4 — ABNORMAL LOW (ref 9–23)
BUN: 4 mg/dL — ABNORMAL LOW (ref 6–20)
Bilirubin Total: 0.3 mg/dL (ref 0.0–1.2)
CO2: 22 mmol/L (ref 20–29)
Calcium: 9.5 mg/dL (ref 8.7–10.2)
Chloride: 104 mmol/L (ref 96–106)
Creatinine, Ser: 0.91 mg/dL (ref 0.57–1.00)
Globulin, Total: 2.7 g/dL (ref 1.5–4.5)
Glucose: 87 mg/dL (ref 70–99)
Potassium: 4.6 mmol/L (ref 3.5–5.2)
Sodium: 140 mmol/L (ref 134–144)
Total Protein: 7.1 g/dL (ref 6.0–8.5)
eGFR: 86 mL/min/{1.73_m2} (ref >59)

## 2022-08-22 LAB — HEMOGLOBIN A1C
Est. average glucose Bld gHb Est-mCnc: 111 mg/dL
Hgb A1c MFr Bld: 5.5 % (ref 4.8–5.6)

## 2022-08-22 LAB — LIPID PANEL
Chol/HDL Ratio: 3.5 ratio (ref 0.0–4.4)
Cholesterol, Total: 139 mg/dL (ref 100–199)
HDL: 40 mg/dL (ref >39)
LDL Chol Calc (NIH): 89 mg/dL (ref 0–99)
Triglycerides: 45 mg/dL (ref 0–149)
VLDL Cholesterol Cal: 10 mg/dL (ref 5–40)

## 2022-08-22 LAB — HIV ANTIBODY (ROUTINE TESTING W REFLEX): HIV Screen 4th Generation wRfx: NONREACTIVE

## 2022-08-22 LAB — HEPATITIS C ANTIBODY: Hep C Virus Ab: NONREACTIVE

## 2022-08-26 ENCOUNTER — Encounter: Payer: Self-pay | Admitting: Family Medicine

## 2022-08-26 NOTE — Progress Notes (Signed)
Please inform patient that her lab results are within normal limits. Thanks

## 2022-09-07 ENCOUNTER — Encounter (HOSPITAL_COMMUNITY): Payer: Self-pay

## 2022-09-07 ENCOUNTER — Ambulatory Visit (HOSPITAL_COMMUNITY)
Admission: RE | Admit: 2022-09-07 | Discharge: 2022-09-07 | Disposition: A | Discharge status: Home / Self Care | Payer: Medicaid Other | Source: Ambulatory Visit | Attending: Emergency Medicine | Admitting: Emergency Medicine

## 2022-09-07 ENCOUNTER — Ambulatory Visit (INDEPENDENT_AMBULATORY_CARE_PROVIDER_SITE_OTHER): Payer: Medicaid Other

## 2022-09-07 VITALS — BP 124/87 | HR 75 | Temp 97.9°F | Resp 14

## 2022-09-07 DIAGNOSIS — S161XXA Strain of muscle, fascia and tendon at neck level, initial encounter: Secondary | ICD-10-CM

## 2022-09-07 DIAGNOSIS — M62838 Other muscle spasm: Secondary | ICD-10-CM | POA: Diagnosis not present

## 2022-09-07 MED ORDER — ACETAMINOPHEN 325 MG PO TABS
975.0000 mg | ORAL_TABLET | Freq: Once | ORAL | Status: AC
  Administered 2022-09-07: 975 mg via ORAL

## 2022-09-07 MED ORDER — IBUPROFEN 800 MG PO TABS: 800.0000 mg | ORAL_TABLET | Freq: Three times a day (TID) | ORAL | 0 refills | Status: DC | PRN

## 2022-09-07 MED ORDER — ACETAMINOPHEN 325 MG PO TABS
ORAL_TABLET | ORAL | Status: AC
  Filled 2022-09-07: qty 3

## 2022-09-07 MED ORDER — KETOROLAC TROMETHAMINE 30 MG/ML IJ SOLN
INTRAMUSCULAR | Status: AC
  Filled 2022-09-07: qty 1

## 2022-09-07 MED ORDER — BACLOFEN 10 MG PO TABS: 10.0000 mg | ORAL_TABLET | Freq: Three times a day (TID) | ORAL | 0 refills | Status: AC

## 2022-09-07 MED ORDER — ACETAMINOPHEN 500 MG PO TABS: 1000.0000 mg | ORAL_TABLET | Freq: Three times a day (TID) | ORAL | 0 refills | Status: DC | PRN

## 2022-09-07 MED ORDER — KETOROLAC TROMETHAMINE 30 MG/ML IJ SOLN
30.0000 mg | Freq: Once | INTRAMUSCULAR | Status: AC
  Administered 2022-09-07: 30 mg via INTRAMUSCULAR

## 2022-09-07 NOTE — ED Triage Notes (Signed)
Patient reports that she had a "crook in her left neck" 4 days ago and is now radiating into the left shoulder and posterior neck.  Patient states she has taken robaxin (from a previous RX.), aleve, and Advil. Patient states she has not taken anything today.

## 2022-09-07 NOTE — Discharge Instructions (Addendum)
The results of the x-ray that we performed during your visit today showed no degenerative bone or disc disease or misalignment of your cervical spine.   The mainstay of therapy for musculoskeletal pain is reduction of inflammation and relaxation of tension which is causing inflammation.  Keep in mind, pain always begets more pain.  To help you stay ahead of your pain and inflammation, I have provided the following regimen for you:   During your visit today, you received an injection of ketorolac, high-dose nonsteroidal anti-inflammatory pain medication that should significantly reduce your pain for the next 6 to 8 hours.  You were also provided with 975 mg of Tylenol during your visit today.  Please continue taking Tylenol 1000 mg every 8 hours for the next 10 days, your next dose will be this evening before bedtime.  It is safe to take 3000 mg of Tylenol in a 24-hour.  Please do not exceed this amount.  Tylenol works best when taken on a scheduled basis.   This evening, you can begin taking baclofen 10 mg.  This is a highly effective muscle relaxer and antispasmodic which should continue to provide you with relaxation of your tense muscles, allow you to sleep well and to keep your pain under control.  You can continue taking this medication 3 times daily as you need to.  If you find that this medication makes you too sleepy, you can break them in half for your daytime doses and, if needed double them for your nighttime dose.  Do not take more than 30 mg of baclofen in a 24-hour period.   This evening, please begin taking ibuprofen 800 mg 3 times daily as needed for pain not well relieved by baclofen and Tylenol.  Please keep in mind that it is always easier to treat a little bit of pain that is to treat a lot of pain.  I recommend that for the next few days, you take this medication on a scheduled basis.  After that, take it when you begin to feel the pain returning, do not wait until you are in a lot of  pain.   During the day, please set aside time to apply ice to the affected area 4 times daily for 20 minutes each application.  This can be achieved by using a bag of frozen peas or corn, a Ziploc bag filled with ice and water, or Ziploc bag filled with half rubbing alcohol and half Dawn dish detergent, frozen into a slush.  Please be careful not to apply ice directly to your skin, always place a soft cloth between you and the ice pack.  Over-the-counter products such as IcyHot and Biofreeze do not work nearly as well.   Please avoid attempts to stretch or strengthen the affected area until you are feeling completely pain-free.  Attempts to do so will only prolong the healing process.   I also recommend that you remain out of work for the next several days, I provided you with a note to return to work in 3 days.  If you feel that you need this time extended, please follow-up with your primary care provider or return to urgent care for reevaluation so that we can provide you with a note for another 3 days.   Thank you for visiting urgent care today.  We appreciate the opportunity to participate in your care.

## 2022-09-07 NOTE — ED Provider Notes (Signed)
[facility]    CSN: [id number] Arrival date & time: 09/07/22  1315    HISTORY   Chief Complaint  Patient presents with   Shoulder Pain    I didn't see option for neck pain. But I've had pain throbbing , pulsating for the last 4 days. - Entered by patient   HPI Melinda Padilla is a pleasant, 32 y.o. female who presents to urgent care today. Patient reports that she has had a "crook in her left neck" x 4 days and that the pain is now radiating into the left shoulder and posterior neck as well.  States she has never had similar issue in the past.  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  Patient states she has been provided Robaxin for muscle spasm in the past, states she tried taking this without improvement of her symptoms.  Patient states she is also been taking Advil and Aleve, states Aleve has helped the most.  Patient states she has not tried any medications to alleviate her pain today.  The history is provided by the patient.   Past Medical History:  Diagnosis Date   Heart murmur    Patient Active Problem List   Diagnosis Date Noted   Displaced fracture of lateral malleolus of right fibula, initial encounter for closed fracture    Past Surgical History:  Procedure Laterality Date   NO PAST SURGERIES     ORIF ANKLE FRACTURE Right 11/26/2020   Procedure: OPEN REDUCTION INTERNAL FIXATION (ORIF) RIGHT LATERAL MALLEOLUS;  Surgeon: Tarry Kos, MD;  Location: [facility];  Service: Orthopedics;  Laterality: Right;   OB History   No obstetric history on file.    Home Medications    Prior to Admission medications   Medication Sig Start Date End Date Taking? Authorizing Provider  acetaminophen (TYLENOL) 500 MG tablet Take 2 tablets (1,000 mg total) by mouth every 8 (eight) hours as needed for up to 30 doses for mild pain or fever. 09/07/22  Yes Theadora Rama Scales,  PA-C  baclofen (LIORESAL) 10 MG tablet Take 1 tablet (10 mg total) by mouth 3 (three) times daily for 10 days. 09/07/22 09/17/22 Yes Theadora Rama Scales, PA-C  ibuprofen (ADVIL) 800 MG tablet Take 1 tablet (800 mg total) by mouth every 8 (eight) hours as needed for up to 30 doses for moderate pain. 09/07/22  Yes Theadora Rama Scales, PA-C    Family History Family History  Problem Relation Age of Onset   Diabetes Mother    Cancer Mother    Healthy Father    Heart failure Paternal Grandmother    Kidney failure Paternal Grandmother    Cancer Paternal Grandfather    Social History Social History   Tobacco Use   Smoking status: Every Day    Packs/day: .5    Types: Cigarettes   Smokeless tobacco: Never  Vaping Use   Vaping Use: Former  Substance Use Topics   Alcohol use: No   Drug use: Yes    Types: Marijuana   Allergies   Latex  Review of Systems Review of Systems Pertinent findings revealed after performing a 14 point review of systems has been noted in the history of present illness.  Physical Exam Vital Signs BP 124/87 (BP Location: Left Arm)   Pulse 75   Temp 97.9 F (36.6 C) (Oral)   Resp 14   LMP 08/24/2022  SpO2 97%   No data found.  Physical Exam Vitals and nursing note reviewed.  Constitutional:      General: She is not in acute distress.    Appearance: Normal appearance.  HENT:     Head: Normocephalic and atraumatic.  Eyes:     Pupils: Pupils are equal, round, and reactive to light.  Cardiovascular:     Rate and Rhythm: Normal rate and regular rhythm.  Pulmonary:     Effort: Pulmonary effort is normal.     Breath sounds: Normal breath sounds.  Musculoskeletal:     Cervical back: Neck supple. Spasms and tenderness present. No swelling, edema, bony tenderness or crepitus. Pain with movement present. Decreased range of motion.     Thoracic back: Normal.     Lumbar back: Normal.  Skin:    General: Skin is warm and dry.  Neurological:      General: No focal deficit present.     Mental Status: She is alert and oriented to person, place, and time. Mental status is at baseline.  Psychiatric:        Mood and Affect: Mood normal.        Behavior: Behavior normal.        Thought Content: Thought content normal.        Judgment: Judgment normal.     UC Couse / Diagnostics / Procedures:     Radiology DG Cervical Spine Complete  Result Date: 09/07/2022 CLINICAL DATA:  Recurrent left-sided neck pain. Pain extends to the left shoulder. EXAM: CERVICAL SPINE - COMPLETE 4+ VIEW COMPARISON:  None Available. FINDINGS: The prevertebral soft tissues are within normal limits the cervical spine is visualized from the skull base through the cervicothoracic junction. Patient earrings overlap the dens on the lateral view. The left-sided foramina are not well visualized due to the obliquity submitted. Right foramina are patent. No acute fractures are present. The visualized lung apices are clear. IMPRESSION: 1. No acute or focal abnormality to explain the patient's symptoms. Electronically Signed   By: Marin Roberts M.D.   On: 09/07/2022 15:00    Procedures Procedures (including critical care time) EKG  Pending results:  Labs Reviewed - No data to display  Medications Ordered in UC: Medications  ketorolac (TORADOL) 30 MG/ML injection 30 mg (30 mg Intramuscular Given 09/07/22 1417)  acetaminophen (TYLENOL) tablet 975 mg (975 mg Oral Given 09/07/22 1416)    UC Diagnoses / Final Clinical Impressions(s)   I have reviewed the triage vital signs and the nursing notes.  Pertinent labs & imaging results that were available during my care of the patient were reviewed by me and considered in my medical decision making (see chart for details).    Final diagnoses:  Strain of cervical portion of left trapezius muscle  Trapezius muscle spasm    Patient advised of x-ray findings. Patient was provided with an injection of ketorolac during their  visit today for acute pain relief. Patient was advised to: Take naproxen 500 mg twice daily for the next 5 to 7 days Take muscle relaxer 3 times daily (Patient has been advised that if this makes them sleepy, they can just take this at bedtime, up to 20 mg per dose, and try breaking the tablets in half or 5 mg per dose during the day) Begin acetaminophen 1000 mg 3 times daily on a scheduled basis. Apply ice pack to affected area 4 times daily for 20 minutes each time Avoid stretching or strengthening exercises until pain is  completely resolved Return precautions advised  Please see discharge instructions below for details of plan of care as provided to patient. ED Prescriptions     Medication Sig Dispense Auth. Provider   baclofen (LIORESAL) 10 MG tablet Take 1 tablet (10 mg total) by mouth 3 (three) times daily for 10 days. 30 tablet Theadora Rama Scales, PA-C   ibuprofen (ADVIL) 800 MG tablet Take 1 tablet (800 mg total) by mouth every 8 (eight) hours as needed for up to 30 doses for moderate pain. 30 tablet Theadora Rama Scales, PA-C   acetaminophen (TYLENOL) 500 MG tablet Take 2 tablets (1,000 mg total) by mouth every 8 (eight) hours as needed for up to 30 doses for mild pain or fever. 60 tablet Theadora Rama Scales, PA-C      PDMP not reviewed this encounter.  Discharge Instructions:   Discharge Instructions      The results of the x-ray that we performed during your visit today showed no degenerative bone or disc disease or misalignment of your cervical spine.   The mainstay of therapy for musculoskeletal pain is reduction of inflammation and relaxation of tension which is causing inflammation.  Keep in mind, pain always begets more pain.  To help you stay ahead of your pain and inflammation, I have provided the following regimen for you:   During your visit today, you received an injection of ketorolac, high-dose nonsteroidal anti-inflammatory pain medication that should  significantly reduce your pain for the next 6 to 8 hours.  You were also provided with 975 mg of Tylenol during your visit today.  Please continue taking Tylenol 1000 mg every 8 hours for the next 10 days, your next dose will be this evening before bedtime.  It is safe to take 3000 mg of Tylenol in a 24-hour.  Please do not exceed this amount.  Tylenol works best when taken on a scheduled basis.   This evening, you can begin taking baclofen 10 mg.  This is a highly effective muscle relaxer and antispasmodic which should continue to provide you with relaxation of your tense muscles, allow you to sleep well and to keep your pain under control.  You can continue taking this medication 3 times daily as you need to.  If you find that this medication makes you too sleepy, you can break them in half for your daytime doses and, if needed double them for your nighttime dose.  Do not take more than 30 mg of baclofen in a 24-hour period.   This evening, please begin taking ibuprofen 800 mg 3 times daily as needed for pain not well relieved by baclofen and Tylenol.  Please keep in mind that it is always easier to treat a little bit of pain that is to treat a lot of pain.  I recommend that for the next few days, you take this medication on a scheduled basis.  After that, take it when you begin to feel the pain returning, do not wait until you are in a lot of pain.   During the day, please set aside time to apply ice to the affected area 4 times daily for 20 minutes each application.  This can be achieved by using a bag of frozen peas or corn, a Ziploc bag filled with ice and water, or Ziploc bag filled with half rubbing alcohol and half Dawn dish detergent, frozen into a slush.  Please be careful not to apply ice directly to your skin, always place a soft cloth  between you and the ice pack.  Over-the-counter products such as IcyHot and Biofreeze do not work nearly as well.   Please avoid attempts to stretch or  strengthen the affected area until you are feeling completely pain-free.  Attempts to do so will only prolong the healing process.   I also recommend that you remain out of work for the next several days, I provided you with a note to return to work in 3 days.  If you feel that you need this time extended, please follow-up with your primary care provider or return to urgent care for reevaluation so that we can provide you with a note for another 3 days.   Thank you for visiting urgent care today.  We appreciate the opportunity to participate in your care.     Disposition Upon Discharge:  Condition: stable for discharge home Home: take medications as prescribed; routine discharge instructions as discussed; follow up as advised.  Patient presented with an acute illness with associated systemic symptoms and significant discomfort requiring urgent management. In my opinion, this is a condition that a prudent lay person (someone who possesses an average knowledge of health and medicine) may potentially expect to result in complications if not addressed urgently such as respiratory distress, impairment of bodily function or dysfunction of bodily organs.   Routine symptom specific, illness specific and/or disease specific instructions were discussed with the patient and/or caregiver at length.   As such, the patient has been evaluated and assessed, work-up was performed and treatment was provided in alignment with urgent care protocols and evidence based medicine.  Patient/parent/caregiver has been advised that the patient may require follow up for further testing and treatment if the symptoms continue in spite of treatment, as clinically indicated and appropriate.  Patient/parent/caregiver has been advised to report to orthopedic urgent care clinic or return to the [facility] or PCP in 3-5 days if no better; follow-up with orthopedics, PCP or the Emergency Department if new signs and symptoms develop or if the  current signs or symptoms continue to change or worsen for further workup, evaluation and treatment as clinically indicated and appropriate  The patient will follow up with their current PCP if and as advised. If the patient does not currently have a PCP we will have assisted them in obtaining one.   The patient may need specialty follow up if the symptoms continue, in spite of conservative treatment and management, for further workup, evaluation, consultation and treatment as clinically indicated and appropriate.  Patient/parent/caregiver verbalized understanding and agreement of plan as discussed.  All questions were addressed during visit.  Please see discharge instructions below for further details of plan.  This office note has been dictated using Teaching laboratory technician.  Unfortunately, this method of dictation can sometimes lead to typographical or grammatical errors.  I apologize for your inconvenience in advance if this occurs.  Please do not hesitate to reach out to me if clarification is needed.      Theadora Rama Scales, PA-C 09/07/22 1743

## 2022-09-12 IMAGING — XA DG C-ARM 1-60 MIN
1 series · 3 of 3 positions shown · non-contrast
Comparison: None.

CLINICAL DATA: Elective surgery DXB.S (OWD-I3-CM)

EXAM:
DG C-ARM 1-60 MIN; RIGHT ANKLE - COMPLETE 3+ VIEW
FLUOROSCOPY TIME:  Fluoroscopy Time:  24 seconds.
Number of Acquired Spot Images: 3

[Series 1: unknown protocol · 0.30mm/px · 3 of 3 slices shown]
[im 1/3]
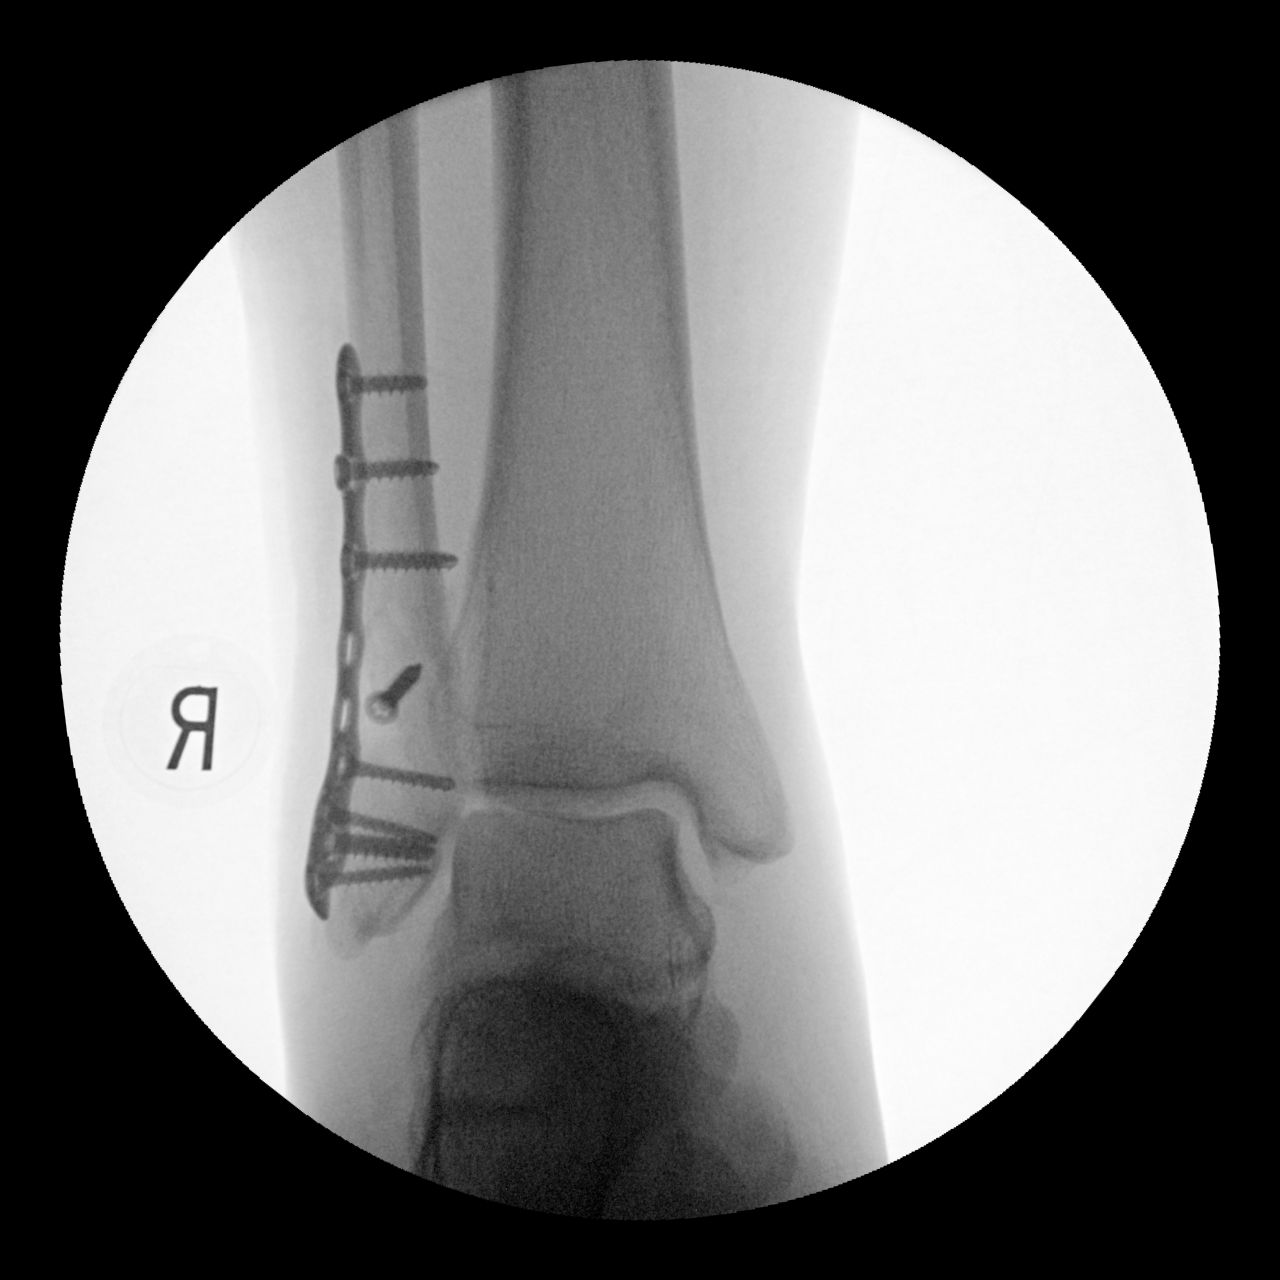
[im 2/3]
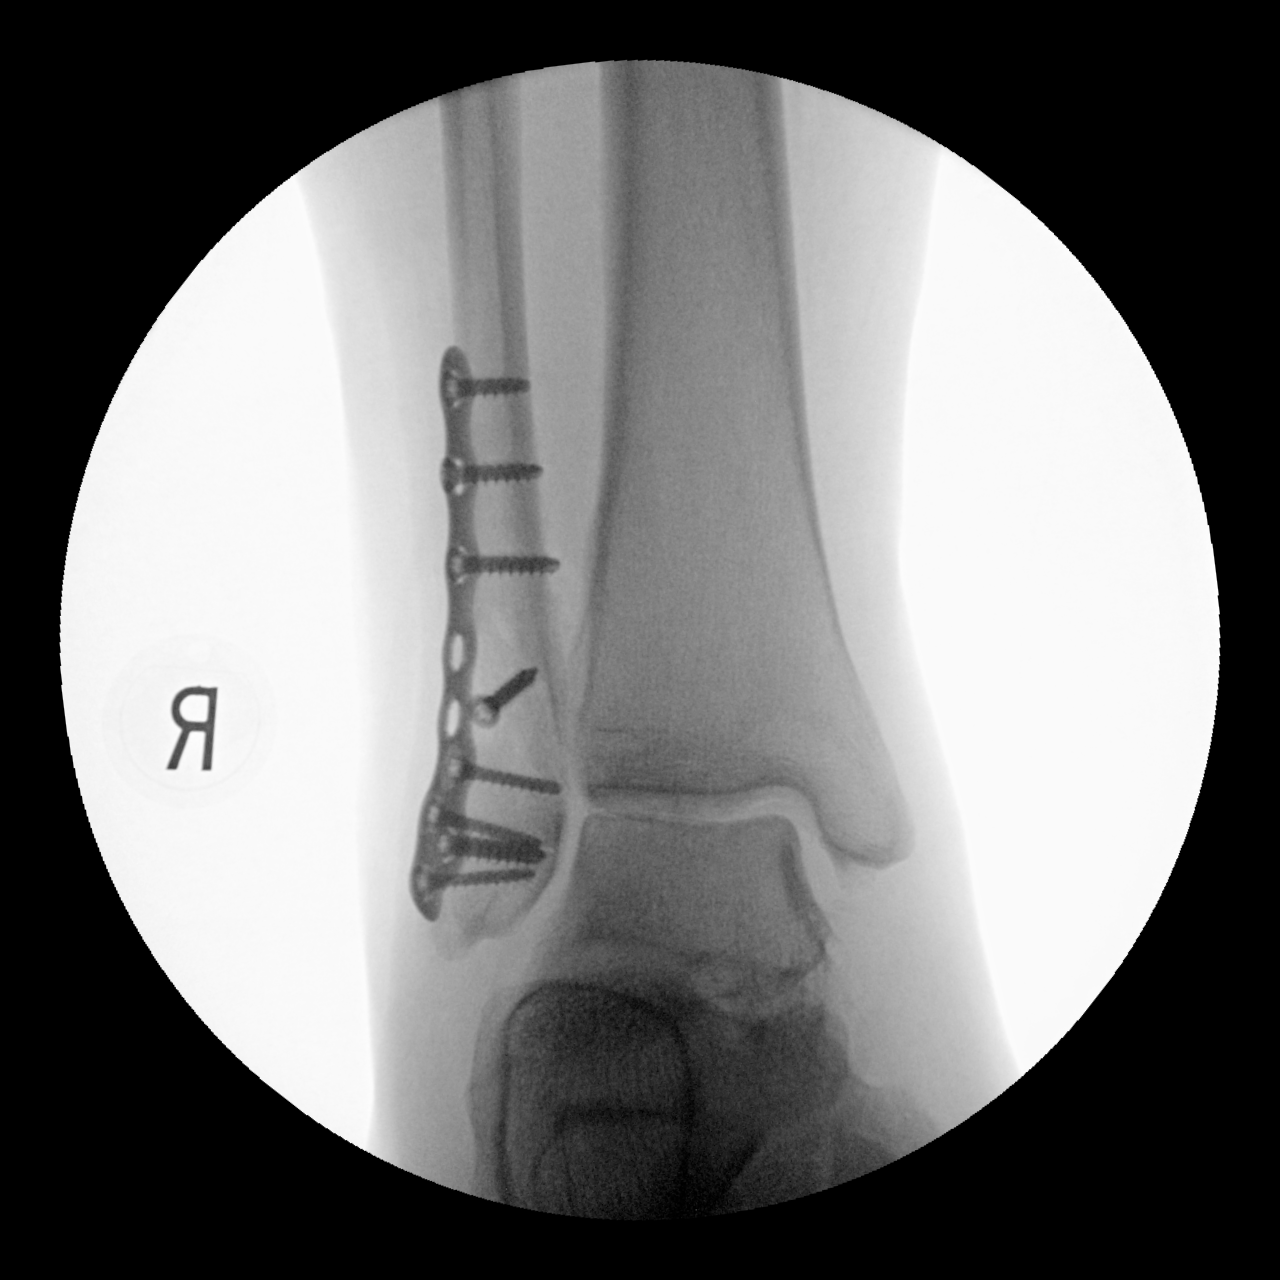
[im 3/3]
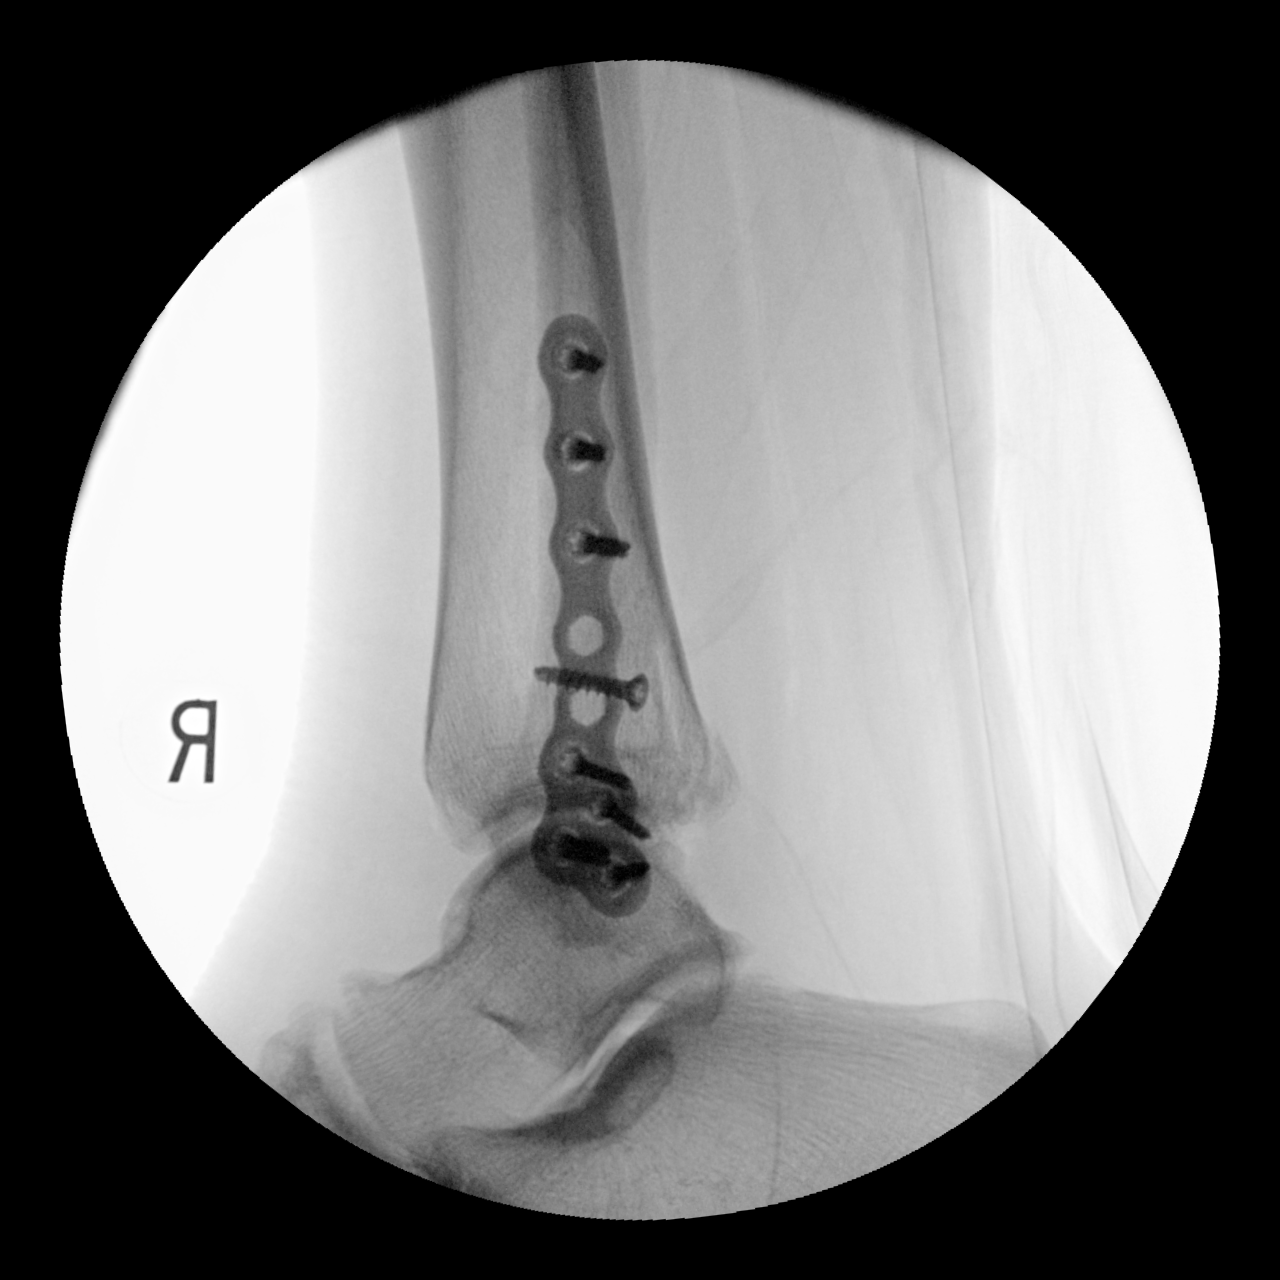

[3 of 3 positions shown; findings below may reference images not displayed]

FINDINGS: Three C-arm fluoroscopic images were obtained intraoperatively and
submitted for post operative interpretation. These images
demonstrate plate and screw fixation of a distal fibular fracture.
Improved alignment, near anatomic. Please see the performing
provider's procedural report for further detail.
IMPRESSION: Intraoperative fluoroscopy, as detailed above.

## 2022-09-15 NOTE — Progress Notes (Unsigned)
   Office Visit Note   Patient: Melinda Padilla           Date of Birth: 06-25-1990           MRN: [id number] Visit Date: 09/16/2022              Requested by: [name], NP [address] [place],  [place] [address] PCP: [name], NP   Assessment & Plan: Visit Diagnoses: No diagnosis found.  Plan: ***  Follow-Up Instructions: No follow-ups on file.   Orders:  No orders of the defined types were placed in this encounter.  No orders of the defined types were placed in this encounter.     Procedures: No procedures performed   Clinical Data: No additional findings.   Subjective: No chief complaint on file.   HPI  Review of Systems  Constitutional: Negative.   HENT: Negative.    Eyes: Negative.   Respiratory: Negative.    Cardiovascular: Negative.   Endocrine: Negative.   Musculoskeletal: Negative.   Neurological: Negative.   Hematological: Negative.   Psychiatric/Behavioral: Negative.    All other systems reviewed and are negative.   Objective: Vital Signs: LMP 08/24/2022   Physical Exam Vitals and nursing note reviewed.  Constitutional:      Appearance: She is well-developed.  HENT:     Head: Normocephalic and atraumatic.  Pulmonary:     Effort: Pulmonary effort is normal.  Abdominal:     Palpations: Abdomen is soft.  Musculoskeletal:     Cervical back: Neck supple.  Skin:    General: Skin is warm.     Capillary Refill: Capillary refill takes less than 2 seconds.  Neurological:     Mental Status: She is alert and oriented to person, place, and time.  Psychiatric:        Behavior: Behavior normal.        Thought Content: Thought content normal.        Judgment: Judgment normal.   Ortho Exam  Specialty Comments:  No specialty comments available.  Imaging: No results found.   PMFS History: Patient Active Problem List   Diagnosis Date Noted  . Displaced fracture of lateral malleolus of right fibula, initial encounter  for closed fracture    Past Medical History:  Diagnosis Date  . Heart murmur     Family History  Problem Relation Age of Onset  . Diabetes Mother   . Cancer Mother   . Healthy Father   . Heart failure Paternal Grandmother   . Kidney failure Paternal Grandmother   . Cancer Paternal Grandfather     Past Surgical History:  Procedure Laterality Date  . NO PAST SURGERIES    . ORIF ANKLE FRACTURE Right 11/26/2020   Procedure: OPEN REDUCTION INTERNAL FIXATION (ORIF) RIGHT LATERAL MALLEOLUS;  Surgeon: Tarry Kos, MD;  Location: [facility];  Service: Orthopedics;  Laterality: Right;   Social History   Occupational History  . Not on file  Tobacco Use  . Smoking status: Every Day    Packs/day: .5    Types: Cigarettes  . Smokeless tobacco: Never  Vaping Use  . Vaping Use: Former  Substance and Sexual Activity  . Alcohol use: No  . Drug use: Yes    Types: Marijuana  . Sexual activity: Never    Birth control/protection: None

## 2022-09-16 ENCOUNTER — Encounter: Payer: Self-pay | Admitting: Family Medicine

## 2022-09-16 ENCOUNTER — Encounter: Payer: Self-pay | Admitting: Orthopaedic Surgery

## 2022-09-16 ENCOUNTER — Ambulatory Visit (INDEPENDENT_AMBULATORY_CARE_PROVIDER_SITE_OTHER): Payer: Medicaid Other | Admitting: Orthopaedic Surgery

## 2022-09-16 DIAGNOSIS — M542 Cervicalgia: Secondary | ICD-10-CM

## 2022-09-16 MED ORDER — PREDNISONE 10 MG (21) PO TBPK
ORAL_TABLET | ORAL | 3 refills | Status: DC
Start: 2022-09-16 — End: 2022-10-14

## 2022-09-16 MED ORDER — METAXALONE 800 MG PO TABS
800.0000 mg | ORAL_TABLET | Freq: Three times a day (TID) | ORAL | 0 refills | Status: DC
Start: 2022-09-16 — End: 2022-10-14

## 2022-09-18 ENCOUNTER — Emergency Department (HOSPITAL_BASED_OUTPATIENT_CLINIC_OR_DEPARTMENT_OTHER)
Admission: EM | Admit: 2022-09-18 | Discharge: 2022-09-19 | Disposition: A | Discharge status: Home / Self Care | Payer: Medicaid Other | Attending: Emergency Medicine | Admitting: Emergency Medicine

## 2022-09-18 ENCOUNTER — Other Ambulatory Visit: Payer: Self-pay

## 2022-09-18 ENCOUNTER — Emergency Department (HOSPITAL_BASED_OUTPATIENT_CLINIC_OR_DEPARTMENT_OTHER): Payer: Medicaid Other

## 2022-09-18 ENCOUNTER — Other Ambulatory Visit: Payer: Self-pay | Admitting: Family Medicine

## 2022-09-18 ENCOUNTER — Encounter (HOSPITAL_BASED_OUTPATIENT_CLINIC_OR_DEPARTMENT_OTHER): Payer: Self-pay | Admitting: Emergency Medicine

## 2022-09-18 DIAGNOSIS — Z91040 Latex allergy status: Secondary | ICD-10-CM | POA: Insufficient documentation

## 2022-09-18 DIAGNOSIS — R079 Chest pain, unspecified: Secondary | ICD-10-CM | POA: Insufficient documentation

## 2022-09-18 DIAGNOSIS — R519 Headache, unspecified: Secondary | ICD-10-CM | POA: Diagnosis present

## 2022-09-18 DIAGNOSIS — I7774 Dissection of vertebral artery: Secondary | ICD-10-CM

## 2022-09-18 LAB — CBC WITH DIFFERENTIAL/PLATELET
Abs Immature Granulocytes: 0.02 10*3/uL (ref 0.00–0.07)
Basophils Absolute: 0 10*3/uL (ref 0.0–0.1)
Basophils Relative: 0 %
Eosinophils Absolute: 0 10*3/uL (ref 0.0–0.5)
Eosinophils Relative: 0 %
HCT: 44.4 % (ref 36.0–46.0)
Hemoglobin: 15 g/dL (ref 12.0–15.0)
Immature Granulocytes: 0 %
Lymphocytes Relative: 30 %
Lymphs Abs: 3.3 10*3/uL (ref 0.7–4.0)
MCH: 29.7 pg (ref 26.0–34.0)
MCHC: 33.8 g/dL (ref 30.0–36.0)
MCV: 87.9 fL (ref 80.0–100.0)
Monocytes Absolute: 0.8 10*3/uL (ref 0.1–1.0)
Monocytes Relative: 7 %
Neutro Abs: 6.9 10*3/uL (ref 1.7–7.7)
Neutrophils Relative %: 63 %
Platelets: 245 10*3/uL (ref 150–400)
RBC: 5.05 MIL/uL (ref 3.87–5.11)
RDW: 12.5 % (ref 11.5–15.5)
WBC: 11 10*3/uL — ABNORMAL HIGH (ref 4.0–10.5)
nRBC: 0 % (ref 0.0–0.2)

## 2022-09-18 LAB — TROPONIN I (HIGH SENSITIVITY): Troponin I (High Sensitivity): <2 ng/L (ref <18)

## 2022-09-18 LAB — BASIC METABOLIC PANEL
Anion gap: 10 (ref 5–15)
BUN: 9 mg/dL (ref 6–20)
CO2: 22 mmol/L (ref 22–32)
Calcium: 9.7 mg/dL (ref 8.9–10.3)
Chloride: 104 mmol/L (ref 98–111)
Creatinine, Ser: 0.68 mg/dL (ref 0.44–1.00)
GFR, Estimated: >60 mL/min (ref >60)
Glucose, Bld: 85 mg/dL (ref 70–99)
Potassium: 3.7 mmol/L (ref 3.5–5.1)
Sodium: 136 mmol/L (ref 135–145)

## 2022-09-18 MED ORDER — DEXAMETHASONE SODIUM PHOSPHATE 10 MG/ML IJ SOLN
10.0000 mg | Freq: Once | INTRAMUSCULAR | Status: AC
  Administered 2022-09-18: 10 mg via INTRAVENOUS
  Filled 2022-09-18: qty 1

## 2022-09-18 MED ORDER — KETOROLAC TROMETHAMINE 15 MG/ML IJ SOLN
15.0000 mg | Freq: Once | INTRAMUSCULAR | Status: AC
  Administered 2022-09-18: 15 mg via INTRAVENOUS
  Filled 2022-09-18: qty 1

## 2022-09-18 MED ORDER — SODIUM CHLORIDE 0.9 % IV BOLUS
1000.0000 mL | Freq: Once | INTRAVENOUS | Status: AC
  Administered 2022-09-18: 1000 mL via INTRAVENOUS

## 2022-09-18 MED ORDER — DIPHENHYDRAMINE HCL 50 MG/ML IJ SOLN
25.0000 mg | Freq: Once | INTRAMUSCULAR | Status: AC
  Administered 2022-09-18: 25 mg via INTRAVENOUS
  Filled 2022-09-18: qty 1

## 2022-09-18 MED ORDER — IOHEXOL 350 MG/ML SOLN
100.0000 mL | Freq: Once | INTRAVENOUS | Status: AC | PRN
  Administered 2022-09-18: 75 mL via INTRAVENOUS

## 2022-09-18 MED ORDER — PROCHLORPERAZINE EDISYLATE 10 MG/2ML IJ SOLN
10.0000 mg | Freq: Once | INTRAMUSCULAR | Status: AC
  Administered 2022-09-18: 10 mg via INTRAVENOUS
  Filled 2022-09-18: qty 2

## 2022-09-18 NOTE — ED Triage Notes (Signed)
Pt presents to ED POV. Pt c/o pain on the top of her head. Pt reports that for a week she has intermittently had chest and arm pain. HA initially began in shoulders and neck but then moved to top of head.

## 2022-09-18 NOTE — ED Notes (Signed)
Pt to CT

## 2022-09-18 NOTE — ED Provider Notes (Incomplete)
[place] EMERGENCY DEPARTMENT AT [facility] Provider Note   CSN: [id number] Arrival date & time: 09/18/22  1930     History {Add pertinent medical, surgical, social history, OB history to HPI:1} Chief Complaint  Patient presents with  . Headache    Melinda Padilla is a 32 y.o. female.  Patient with no pertinent past medical history presents today with complaints of headache. She states that same began a week ago when she hit the top of her head on a soap dispenser. This did not cause her to loose consciousness. She is not anticoagulated. She has had pain in this area since then. She states that the pain starts on the top of her head and radiates into her neck and down her left arm. 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 She has not started this yet. She did also note that she had been having some chest pain as well but states that when she saw orthopedics it was attributed to her muscle pain and has since resolved. She denies any shortness of breath. She denies vision changes, photophobia, phonophobia, nausea or vomiting. Denies any weakness or numbness/tingling in her extremities. Denies fevers or chills. No neck stiffness. Denies any history of headaches previously.   The history is provided by the patient. No language interpreter was used.  Headache      Home Medications Prior to Admission medications   Medication Sig Start Date End Date Taking? Authorizing Provider  acetaminophen (TYLENOL) 500 MG tablet Take 2 tablets (1,000 mg total) by mouth every 8 (eight) hours as needed for up to 30 doses for mild pain or fever. 09/07/22   Theadora Rama Scales, PA-C  ibuprofen (ADVIL) 800 MG tablet Take 1 tablet (800 mg total) by mouth every 8 (eight) hours as needed for up to 30 doses for moderate pain. 09/07/22   Theadora Rama Scales, PA-C   metaxalone (SKELAXIN) 800 MG tablet Take 1 tablet (800 mg total) by mouth 3 (three) times daily. 09/16/22   Tarry Kos, MD  predniSONE (STERAPRED UNI-PAK 21 TAB) 10 MG (21) TBPK tablet Take as directed 09/16/22   Tarry Kos, MD      Allergies    Latex    Review of Systems   Review of Systems  Neurological:  Positive for headaches.  All other systems reviewed and are negative.   Physical Exam Updated Vital Signs BP (!) 153/104 (BP Location: Right Arm)   Pulse (!) 113   Temp 98.6 F (37 C) (Oral)   Resp 20   LMP 08/24/2022   SpO2 98%  Physical Exam Vitals and nursing note reviewed.  Constitutional:      General: She is not in acute distress.    Appearance: Normal appearance. She is normal weight. She is not ill-appearing, toxic-appearing or diaphoretic.  HENT:     Head: Normocephalic and atraumatic.  Eyes:     Extraocular Movements: Extraocular movements intact.     Pupils: Pupils are equal, round, and reactive to light.  Neck:     Comments: No meningismus Cardiovascular:     Rate and Rhythm: Normal rate and regular rhythm.     Heart sounds: Normal heart sounds.  Pulmonary:     Effort: Pulmonary effort is normal. No respiratory distress.     Breath sounds: Normal breath sounds.  Abdominal:     General: Abdomen  is flat.     Palpations: Abdomen is soft.     Tenderness: There is no abdominal tenderness.  Musculoskeletal:        General: Normal range of motion.     Cervical back: Normal range of motion and neck supple.  Skin:    General: Skin is warm and dry.  Neurological:     General: No focal deficit present.     Mental Status: She is alert and oriented to person, place, and time.     GCS: GCS eye subscore is 4. GCS verbal subscore is 5. GCS motor subscore is 6.     Sensory: Sensation is intact.     Motor: Motor function is intact.     Coordination: Coordination is intact.     Gait: Gait is intact.     Comments: Alert and oriented to self, place, time and  event.    Speech is fluent, clear without dysarthria or dysphasia.    Strength 5/5 in upper/lower extremities   Sensation intact in upper/lower extremities    CN I not tested  CN II grossly intact visual fields bilaterally. Did not visualize posterior eye.  CN III, IV, VI PERRLA and EOMs intact bilaterally  CN V Intact sensation to sharp and light touch to the face  CN VII facial movements symmetric  CN VIII not tested  CN IX, X no uvula deviation, symmetric rise of soft palate  CN XI 5/5 SCM and trapezius strength bilaterally  CN XII Midline tongue protrusion, symmetric L/R movements   Psychiatric:        Mood and Affect: Mood normal.        Behavior: Behavior normal.     ED Results / Procedures / Treatments   Labs (all labs ordered are listed, but only abnormal results are displayed) Labs Reviewed  CBC WITH DIFFERENTIAL/PLATELET - Abnormal; Notable for the following components:      Result Value   WBC 11.0 (*)    All other components within normal limits  BASIC METABOLIC PANEL  TROPONIN I (HIGH SENSITIVITY)  TROPONIN I (HIGH SENSITIVITY)    EKG EKG Interpretation  Date/Time:  Thursday Sep 18 2022 19:40:53 EDT Ventricular Rate:  106 PR Interval:  150 QRS Duration: 84 QT Interval:  332 QTC Calculation: 441 R Axis:   -2 Text Interpretation: Sinus tachycardia Cannot rule out Anterior infarct , age undetermined Abnormal ECG No significant change since last tracing Confirmed by Melene Plan [phone]) on 09/18/2022 8:32:05 PM  Radiology No results found.  Procedures Procedures  {Document cardiac monitor, telemetry assessment procedure when appropriate:1}  Medications Ordered in ED Medications  iohexol (OMNIPAQUE) 350 MG/ML injection 100 mL (has no administration in time range)  sodium chloride 0.9 % bolus 1,000 mL (1,000 mLs Intravenous New Bag/Given 09/18/22 2119)  prochlorperazine (COMPAZINE) injection 10 mg (10 mg Intravenous Given 09/18/22 2129)  diphenhydrAMINE  (BENADRYL) injection 25 mg (25 mg Intravenous Given 09/18/22 2121)  dexamethasone (DECADRON) injection 10 mg (10 mg Intravenous Given 09/18/22 2124)  ketorolac (TORADOL) 15 MG/ML injection 15 mg (15 mg Intravenous Given 09/18/22 2127)    ED Course/ Medical Decision Making/ A&P   {   Click here for ABCD2, HEART and other calculatorsREFRESH Note before signing :1}                          Medical Decision Making Amount and/or Complexity of Data Reviewed Labs: ordered. Radiology: ordered.  Risk Prescription drug management.  This patient is a 32 y.o. female who presents to the ED for concern of headache, this involves an extensive number of treatment options, and is a complaint that carries with it a high risk of complications and morbidity. The emergent differential diagnosis prior to evaluation includes, but is not limited to,  Stroke, increased ICP, meningitis, CVA, intracranial tumor, venous sinus thrombosis, migraine, cluster headache, hypertension, drug related, head injury, tension headache, sinusitis, dental abscess, TMJ, trigeminal neuralgia.  This is not an exhaustive differential.   Past Medical History / Co-morbidities / Social History: N/A  Additional history: Chart reviewed. Pertinent results include: seen by orthopedics on 5/28 for neck pain, diagnosed with left trapezius myofascial syndrome and started on prednisone and Skelaxin and referred to physical therapy  Physical Exam: Physical exam performed. The pertinent findings include: Alert and oriented and neurologically intact without focal deficits  Lab Tests: I ordered, and personally interpreted labs.  The pertinent results include:  WBC 11 likely due to patients steroid use. No other acute laboratory findings   Imaging Studies: I ordered imaging studies including CTA head and neck. I independently visualized and interpreted imaging which showed   ***   I agree with the radiologist interpretation.   Cardiac  Monitoring:  The patient was maintained on a cardiac monitor.  My attending physician Dr. Adela Lank viewed and interpreted the cardiac monitored which showed an underlying rhythm of: sinus tachycardia. I agree with this interpretation.   Medications: I ordered medication including fluids, compazine, benadryl, toradol, and decadron  for headache. Reevaluation of the patient after these medicines showed that the patient resolved. I have reviewed the patients home medicines and have made adjustments as needed.  Consultations Obtained: I requested consultation with the ***,  and discussed lab and imaging findings as well as pertinent plan - they recommend: ***   Disposition: After consideration of the diagnostic results and the patients response to treatment, I feel that *** .   ***emergency department workup does not suggest an emergent condition requiring admission or immediate intervention beyond what has been performed at this time. The plan is: ***. The patient is safe for discharge and has been instructed to return immediately for worsening symptoms, change in symptoms or any other concerns.  I discussed this case with my attending physician Dr. Marland Kitchen who cosigned this note including patient's presenting symptoms, physical exam, and planned diagnostics and interventions. Attending physician stated agreement with plan or made changes to plan which were implemented.     {Document critical care time when appropriate:1} {Document review of labs and clinical decision tools ie heart score, Chads2Vasc2 etc:1}  {Document your independent review of radiology images, and any outside records:1} {Document your discussion with family members, caretakers, and with consultants:1} {Document social determinants of health affecting pt's care:1} {Document your decision making why or why not admission, treatments were needed:1} Final Clinical Impression(s) / ED Diagnoses Final diagnoses:  None    Rx / DC  Orders ED Discharge Orders     None

## 2022-09-18 NOTE — ED Provider Notes (Signed)
[place] EMERGENCY DEPARTMENT AT [facility] Provider Note   CSN: [id number] Arrival date & time: 09/18/22  1930     History {Add pertinent medical, surgical, social history, OB history to HPI:1} Chief Complaint  Patient presents with   Headache    Melinda Padilla is a 32 y.o. female.  Patient with no pertinent past medical history presents today with complaints of headache. She states that same began a week ago when she hit the top of her head on a soap dispenser. This did not cause her to loose consciousness. She is not anticoagulated. She has had pain in this area since then. She states that the pain starts on the top of her head and radiates into her neck and down her left arm. 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 She has not started this yet. She did also note that she had been having some chest pain as well but states that when she saw orthopedics it was attributed to her muscle pain and has since resolved. She denies any shortness of breath. She denies vision changes, photophobia, phonophobia, nausea or vomiting. Denies any weakness or numbness/tingling in her extremities. Denies fevers or chills. No neck stiffness. Denies any history of headaches previously.   The history is provided by the patient. No language interpreter was used.  Headache      Home Medications Prior to Admission medications   Medication Sig Start Date End Date Taking? Authorizing Provider  acetaminophen (TYLENOL) 500 MG tablet Take 2 tablets (1,000 mg total) by mouth every 8 (eight) hours as needed for up to 30 doses for mild pain or fever. 09/07/22   Theadora Rama Scales, PA-C  ibuprofen (ADVIL) 800 MG tablet Take 1 tablet (800 mg total) by mouth every 8 (eight) hours as needed for up to 30 doses for moderate pain. 09/07/22   Theadora Rama Scales, PA-C   metaxalone (SKELAXIN) 800 MG tablet Take 1 tablet (800 mg total) by mouth 3 (three) times daily. 09/16/22   Tarry Kos, MD  predniSONE (STERAPRED UNI-PAK 21 TAB) 10 MG (21) TBPK tablet Take as directed 09/16/22   Tarry Kos, MD      Allergies    Latex    Review of Systems   Review of Systems  Neurological:  Positive for headaches.  All other systems reviewed and are negative.   Physical Exam Updated Vital Signs BP (!) 153/104 (BP Location: Right Arm)   Pulse (!) 113   Temp 98.6 F (37 C) (Oral)   Resp 20   LMP 08/24/2022   SpO2 98%  Physical Exam Vitals and nursing note reviewed.  Constitutional:      General: She is not in acute distress.    Appearance: Normal appearance. She is normal weight. She is not ill-appearing, toxic-appearing or diaphoretic.  HENT:     Head: Normocephalic and atraumatic.  Eyes:     Extraocular Movements: Extraocular movements intact.     Pupils: Pupils are equal, round, and reactive to light.  Neck:     Comments: No meningismus Cardiovascular:     Rate and Rhythm: Normal rate and regular rhythm.     Heart sounds: Normal heart sounds.  Pulmonary:     Effort: Pulmonary effort is normal. No respiratory distress.     Breath sounds: Normal breath sounds.  Abdominal:     General: Abdomen  is flat.     Palpations: Abdomen is soft.     Tenderness: There is no abdominal tenderness.  Musculoskeletal:        General: Normal range of motion.     Cervical back: Normal range of motion and neck supple.  Skin:    General: Skin is warm and dry.  Neurological:     General: No focal deficit present.     Mental Status: She is alert and oriented to person, place, and time.     GCS: GCS eye subscore is 4. GCS verbal subscore is 5. GCS motor subscore is 6.     Sensory: Sensation is intact.     Motor: Motor function is intact.     Coordination: Coordination is intact.     Gait: Gait is intact.     Comments: Alert and oriented to self, place, time and  event.    Speech is fluent, clear without dysarthria or dysphasia.    Strength 5/5 in upper/lower extremities   Sensation intact in upper/lower extremities    CN I not tested  CN II grossly intact visual fields bilaterally. Did not visualize posterior eye.  CN III, IV, VI PERRLA and EOMs intact bilaterally  CN V Intact sensation to sharp and light touch to the face  CN VII facial movements symmetric  CN VIII not tested  CN IX, X no uvula deviation, symmetric rise of soft palate  CN XI 5/5 SCM and trapezius strength bilaterally  CN XII Midline tongue protrusion, symmetric L/R movements   Psychiatric:        Mood and Affect: Mood normal.        Behavior: Behavior normal.     ED Results / Procedures / Treatments   Labs (all labs ordered are listed, but only abnormal results are displayed) Labs Reviewed  CBC WITH DIFFERENTIAL/PLATELET - Abnormal; Notable for the following components:      Result Value   WBC 11.0 (*)    All other components within normal limits  BASIC METABOLIC PANEL  TROPONIN I (HIGH SENSITIVITY)  TROPONIN I (HIGH SENSITIVITY)    EKG EKG Interpretation  Date/Time:  Thursday Sep 18 2022 19:40:53 EDT Ventricular Rate:  106 PR Interval:  150 QRS Duration: 84 QT Interval:  332 QTC Calculation: 441 R Axis:   -2 Text Interpretation: Sinus tachycardia Cannot rule out Anterior infarct , age undetermined Abnormal ECG No significant change since last tracing Confirmed by Melene Plan [phone]) on 09/18/2022 8:32:05 PM  Radiology No results found.  Procedures Procedures  {Document cardiac monitor, telemetry assessment procedure when appropriate:1}  Medications Ordered in ED Medications  iohexol (OMNIPAQUE) 350 MG/ML injection 100 mL (has no administration in time range)  sodium chloride 0.9 % bolus 1,000 mL (1,000 mLs Intravenous New Bag/Given 09/18/22 2119)  prochlorperazine (COMPAZINE) injection 10 mg (10 mg Intravenous Given 09/18/22 2129)  diphenhydrAMINE  (BENADRYL) injection 25 mg (25 mg Intravenous Given 09/18/22 2121)  dexamethasone (DECADRON) injection 10 mg (10 mg Intravenous Given 09/18/22 2124)  ketorolac (TORADOL) 15 MG/ML injection 15 mg (15 mg Intravenous Given 09/18/22 2127)    ED Course/ Medical Decision Making/ A&P   {   Click here for ABCD2, HEART and other calculatorsREFRESH Note before signing :1}                          Medical Decision Making Amount and/or Complexity of Data Reviewed Labs: ordered. Radiology: ordered.  Risk Prescription drug management.  This patient is a 32 y.o. female who presents to the ED for concern of headache, this involves an extensive number of treatment options, and is a complaint that carries with it a high risk of complications and morbidity. The emergent differential diagnosis prior to evaluation includes, but is not limited to,  Stroke, increased ICP, meningitis, CVA, intracranial tumor, venous sinus thrombosis, migraine, cluster headache, hypertension, drug related, head injury, tension headache, sinusitis, dental abscess, TMJ, trigeminal neuralgia.  This is not an exhaustive differential.   Past Medical History / Co-morbidities / Social History: N/A  Additional history: Chart reviewed. Pertinent results include: seen by orthopedics on 5/28 for neck pain, diagnosed with left trapezius myofascial syndrome and started on prednisone and Skelaxin and referred to physical therapy  Physical Exam: Physical exam performed. The pertinent findings include: Alert and oriented and neurologically intact without focal deficits  Lab Tests: I ordered, and personally interpreted labs.  The pertinent results include:  WBC 11 likely due to patients steroid use. No other acute laboratory findings   Imaging Studies: I ordered imaging studies including CTA head and neck. I independently visualized and interpreted imaging which showed   1. 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 2. No intracranial large vessel occlusion or significant stenosis. 3. No other hemodynamically significant stenosis in the neck. 4. No acute intracranial process.  I agree with the radiologist interpretation.   Cardiac Monitoring:  The patient was maintained on a cardiac monitor.  My attending physician Dr. Adela Lank viewed and interpreted the cardiac monitored which showed an underlying rhythm of: sinus tachycardia. I agree with this interpretation.   Medications: I ordered medication including fluids, compazine, benadryl, toradol, and decadron  for headache. Reevaluation of the patient after these medicines showed that the patient resolved. I have reviewed the patients home medicines and have made adjustments as needed.  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 She is alert and oriented and neurologically intact without focal deficits.  Talk to neurosurgery who said no intervention indicated, recommend neurology referral.  Consult placed for same and is pending at shift change and will determine dispo.  Care handoff to EDP Dr. Eudelia Bunch at shift change. Please see their note for further evaluation and dispo  I discussed this case with my attending physician Dr. Adela Lank who cosigned this note including patient's presenting symptoms, physical exam, and planned diagnostics and interventions. Attending physician stated agreement with plan or made changes to plan which were implemented.     Final Clinical Impression(s) / ED Diagnoses Final diagnoses:  None    Rx / DC Orders ED Discharge Orders     None

## 2022-09-19 MED ORDER — ASPIRIN 81 MG PO TBEC
81.0000 mg | DELAYED_RELEASE_TABLET | Freq: Once | ORAL | Status: AC
  Administered 2022-09-19: 81 mg via ORAL
  Filled 2022-09-19: qty 1

## 2022-09-19 MED ORDER — ASPIRIN 81 MG PO TBEC: 81.0000 mg | DELAYED_RELEASE_TABLET | Freq: Every day | ORAL | 12 refills | Status: DC

## 2022-09-19 MED ORDER — CLOPIDOGREL BISULFATE 75 MG PO TABS
75.0000 mg | ORAL_TABLET | Freq: Once | ORAL | Status: AC
  Administered 2022-09-19: 75 mg via ORAL
  Filled 2022-09-19: qty 1

## 2022-09-19 MED ORDER — CLOPIDOGREL BISULFATE 75 MG PO TABS: 75.0000 mg | ORAL_TABLET | Freq: Every day | ORAL | 1 refills | Status: AC

## 2022-09-19 NOTE — ED Notes (Signed)
RN reviewed discharge instructions with pt. Pt verbalized understanding and had no further questions. VSS upon discharge.  

## 2022-09-20 ENCOUNTER — Other Ambulatory Visit: Payer: Self-pay

## 2022-09-20 ENCOUNTER — Encounter (HOSPITAL_COMMUNITY): Payer: Self-pay

## 2022-09-20 ENCOUNTER — Emergency Department (HOSPITAL_COMMUNITY)
Admission: EM | Admit: 2022-09-20 | Discharge: 2022-09-21 | Disposition: A | Discharge status: Home / Self Care | Payer: Medicaid Other | Attending: Emergency Medicine | Admitting: Emergency Medicine

## 2022-09-20 DIAGNOSIS — Z6837 Body mass index (BMI) 37.0-37.9, adult: Secondary | ICD-10-CM | POA: Diagnosis not present

## 2022-09-20 DIAGNOSIS — Z91040 Latex allergy status: Secondary | ICD-10-CM | POA: Insufficient documentation

## 2022-09-20 DIAGNOSIS — Z7982 Long term (current) use of aspirin: Secondary | ICD-10-CM | POA: Insufficient documentation

## 2022-09-20 DIAGNOSIS — Z7901 Long term (current) use of anticoagulants: Secondary | ICD-10-CM | POA: Diagnosis not present

## 2022-09-20 DIAGNOSIS — I7774 Dissection of vertebral artery: Secondary | ICD-10-CM | POA: Diagnosis not present

## 2022-09-20 DIAGNOSIS — E669 Obesity, unspecified: Secondary | ICD-10-CM | POA: Diagnosis not present

## 2022-09-20 DIAGNOSIS — R519 Headache, unspecified: Secondary | ICD-10-CM | POA: Insufficient documentation

## 2022-09-20 NOTE — ED Triage Notes (Signed)
Pt states that her head pain is not getting better from 2 days ago after hitting it on something at work. Pt states that she has had pressure chest pain for a few days.

## 2022-09-21 ENCOUNTER — Emergency Department (HOSPITAL_COMMUNITY): Payer: Medicaid Other

## 2022-09-21 MED ORDER — HYDROCODONE-ACETAMINOPHEN 5-325 MG PO TABS: 2.0000 | ORAL_TABLET | ORAL | 0 refills | Status: DC | PRN

## 2022-09-21 MED ORDER — PROCHLORPERAZINE EDISYLATE 10 MG/2ML IJ SOLN
10.0000 mg | Freq: Once | INTRAMUSCULAR | Status: AC
  Administered 2022-09-21: 10 mg via INTRAVENOUS
  Filled 2022-09-21: qty 2

## 2022-09-21 MED ORDER — DIPHENHYDRAMINE HCL 50 MG/ML IJ SOLN
25.0000 mg | Freq: Once | INTRAMUSCULAR | Status: AC
  Administered 2022-09-21: 25 mg via INTRAVENOUS
  Filled 2022-09-21: qty 1

## 2022-09-21 NOTE — Discharge Instructions (Signed)
You were seen today for ongoing headache.  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  Continue your medications as prescribed.  CT head here does not show any bleeding.  Given that your onset of headache was related to trauma, you may have sustained a mild concussion.  Make sure that you are getting plenty of rest.  You need to make sure to minimize stimulation including screens such as phones or TVs.  Follow-up with neurology as previously recommended.

## 2022-09-21 NOTE — ED Provider Notes (Signed)
[place] [facility] AT [facility] Provider Note   CSN: [id number] Arrival date & time: 09/20/22  2323     History  Chief Complaint  Patient presents with   Headache   Chest Pain    Melinda Padilla is a 32 y.o. female.  HPI     This is a 32 year old female who presents with persistent headache.  Patient has had ongoing headache over the top of her head.  She was seen and evaluated 2 days ago and diagnosed with a dissection.  She was placed on aspirin and Plavix.  She states that she took an aspirin earlier today and it helped the pain somewhat but has had persistent worsening right-sided headache.  She states that it is pressure-like.  This is the same pain that she had when she presented 2 days ago.  Denies any weakness, numbness, tingling, strokelike symptoms.  She states sometimes she has a hard time concentrating and feels like she has some blurry vision.  She is also continuing to endorse some chest pressure.  She had similar complaints again 2 days ago when she was evaluated.  At that time troponin was negative.  Home Medications Prior to Admission medications   Medication Sig Start Date End Date Taking? Authorizing Provider  aspirin EC 81 MG tablet Take 1 tablet (81 mg total) by mouth daily. Swallow whole. 09/19/22  Yes Cardama, Amadeo Garnet, MD  clopidogrel (PLAVIX) 75 MG tablet Take 1 tablet (75 mg total) by mouth daily. 09/19/22 11/18/22 Yes Cardama, Amadeo Garnet, MD  HYDROcodone-acetaminophen (NORCO/VICODIN) 5-325 MG tablet Take 2 tablets by mouth every 4 (four) hours as needed. 09/21/22  Yes Ilah Boule, Mayer Masker, MD  ibuprofen (ADVIL) 800 MG tablet Take 1 tablet (800 mg total) by mouth every 8 (eight) hours as needed for up to 30 doses for moderate pain. 09/07/22  Yes Theadora Rama Scales, PA-C  metaxalone (SKELAXIN) 800 MG tablet Take 1 tablet (800 mg total) by mouth 3 (three) times daily. Patient taking differently: Take 800 mg by mouth 3 (three) times  daily as needed for muscle spasms. 09/16/22  Yes Tarry Kos, MD  acetaminophen (TYLENOL) 500 MG tablet Take 2 tablets (1,000 mg total) by mouth every 8 (eight) hours as needed for up to 30 doses for mild pain or fever. 09/07/22   Theadora Rama Scales, PA-C  predniSONE (STERAPRED UNI-PAK 21 TAB) 10 MG (21) TBPK tablet Take as directed 09/16/22   Tarry Kos, MD      Allergies    Latex    Review of Systems   Review of Systems  Cardiovascular:  Positive for chest pain.  Neurological:  Positive for headaches. Negative for weakness and numbness.  All other systems reviewed and are negative.   Physical Exam Updated Vital Signs BP 105/65   Pulse 61   Temp 98.3 F (36.8 C) (Oral)   Resp 18   Ht 1.702 m (5\' 7" )   Wt 107.5 kg   LMP 08/24/2022   SpO2 96%   BMI 37.12 kg/m  Physical Exam Vitals and nursing note reviewed.  Constitutional:      Appearance: She is well-developed. She is obese. She is not ill-appearing.  HENT:     Head: Normocephalic and atraumatic.  Eyes:     Pupils: Pupils are equal, round, and reactive to light.  Cardiovascular:     Rate and Rhythm: Normal rate and regular rhythm.     Heart sounds: Normal heart sounds.  Pulmonary:  Effort: Pulmonary effort is normal. No respiratory distress.     Breath sounds: No wheezing.  Abdominal:     General: Bowel sounds are normal.     Palpations: Abdomen is soft.  Musculoskeletal:     Cervical back: Neck supple.  Skin:    General: Skin is warm and dry.  Neurological:     Mental Status: She is alert and oriented to person, place, and time.     Comments: Cranial nerves II through XII intact, 5 out of 5 strength in all 4 extremities, no dysmetria to finger-nose-finger     ED Results / Procedures / Treatments   Labs (all labs ordered are listed, but only abnormal results are displayed) Labs Reviewed - No data to display  EKG EKG Interpretation  Date/Time:  Saturday September 20 2022 23:30:57 EDT Ventricular  Rate:  74 PR Interval:  159 QRS Duration: 106 QT Interval:  383 QTC Calculation: 425 R Axis:   -24 Text Interpretation: Sinus rhythm Borderline left axis deviation Low voltage, precordial leads Confirmed by Ross Marcus ([id number]) on 09/21/2022 12:51:26 AM  Radiology CT Head Wo Contrast  Result Date: 09/21/2022 CLINICAL DATA:  Headache, increasing frequency or severity EXAM: CT HEAD WITHOUT CONTRAST TECHNIQUE: Contiguous axial images were obtained from the base of the skull through the vertex without intravenous contrast. RADIATION DOSE REDUCTION: This exam was performed according to the departmental dose-optimization program which includes automated exposure control, adjustment of the mA and/or kV according to patient size and/or use of iterative reconstruction technique. COMPARISON:  Head CT and CTA 3 days ago 09/18/2022 FINDINGS: Brain: No intracranial hemorrhage, mass effect, or midline shift. No hydrocephalus. The basilar cisterns are patent. No evidence of territorial infarct or acute ischemia. No extra-axial or intracranial fluid collection. Vascular: No hyperdense vessel or unexpected calcification. Skull: Normal. Negative for fracture or focal lesion. Sinuses/Orbits: Paranasal sinuses and mastoid air cells are clear. The visualized orbits are unremarkable. Other: None. IMPRESSION: Negative noncontrast head CT. Electronically Signed   By: Narda Rutherford M.D.   On: 09/21/2022 00:33    Procedures Procedures    Medications Ordered in ED Medications  prochlorperazine (COMPAZINE) injection 10 mg (10 mg Intravenous Given 09/21/22 0047)  diphenhydrAMINE (BENADRYL) injection 25 mg (25 mg Intravenous Given 09/21/22 0050)    ED Course/ Medical Decision Making/ A&P                             Medical Decision Making Amount and/or Complexity of Data Reviewed Radiology: ordered.  Risk Prescription drug management.   This patient presents to the ED for concern of headache, chest pain, this  involves an extensive number of treatment options, and is a complaint that carries with it a high risk of complications and morbidity.  I considered the following differential and admission for this acute, potentially life threatening condition.  The differential diagnosis includes headache secondary to known dissection, concussion, tension headache  MDM:    This is a 32 year old female who presents with headache and ongoing chest discomfort.  She has been seen and evaluated similarly recently.  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  She is on Plavix and aspirin.  She states that headache really started with the initial trauma prior to this evaluation.  Question whether she may have a concussion given ongoing headache and blurry vision.  CT scan today does not show any acute bleeding.  She is neurologically intact  and otherwise has no red flags.  She was given a migraine cocktail.  Regarding her chest pain.  EKG today is nonischemic.  She had a workup to include troponin and chest x-ray just 3 days ago that was reassuring.  Feel that this is likely nonischemic in nature.  She also has a recent musculoskeletal strain that is likely contributing.  On recheck, she states she feels much better.  Discussed with her that she may have a mild concussion.  She has been referred to neurology.  Recommended neurology follow-up and we discussed concussion precautions.  Will give a short course of pain medication given persistence of headaches despite conservative measures at home including ibuprofen.  (Labs, imaging, consults)  Labs: I Ordered, and personally interpreted labs.  The pertinent results include: None  Imaging Studies ordered: I ordered imaging studies including CT head I independently visualized and interpreted imaging. I agree with the radiologist interpretation  Additional history obtained from friend at bedside.  External records from outside source  obtained and reviewed including prior evaluations  Cardiac Monitoring: The patient was maintained on a cardiac monitor.  If on the cardiac monitor, I personally viewed and interpreted the cardiac monitored which showed an underlying rhythm of: Sinus rhythm  Reevaluation: After the interventions noted above, I reevaluated the patient and found that they have :improved  Social Determinants of Health:  lives independently  Disposition: Discharge  Co morbidities that complicate the patient evaluation  Past Medical History:  Diagnosis Date   Heart murmur      Medicines Meds ordered this encounter  Medications   prochlorperazine (COMPAZINE) injection 10 mg   diphenhydrAMINE (BENADRYL) injection 25 mg   HYDROcodone-acetaminophen (NORCO/VICODIN) 5-325 MG tablet    Sig: Take 2 tablets by mouth every 4 (four) hours as needed.    Dispense:  10 tablet    Refill:  0    I have reviewed the patients home medicines and have made adjustments as needed  Problem List / ED Course: Problem List Items Addressed This Visit   None Visit Diagnoses     Bad headache    -  Primary   Relevant Medications   HYDROcodone-acetaminophen (NORCO/VICODIN) 5-325 MG tablet   Dissection, vertebral artery (HCC)                       Final Clinical Impression(s) / ED Diagnoses Final diagnoses:  Bad headache  Dissection, vertebral artery (HCC)    Rx / DC Orders ED Discharge Orders          Ordered    HYDROcodone-acetaminophen (NORCO/VICODIN) 5-325 MG tablet  Every 4 hours PRN        09/21/22 0223              Shon Baton, MD 09/21/22 [phone]

## 2022-09-25 ENCOUNTER — Ambulatory Visit: Payer: Self-pay | Admitting: Nurse Practitioner

## 2022-10-02 ENCOUNTER — Ambulatory Visit (INDEPENDENT_AMBULATORY_CARE_PROVIDER_SITE_OTHER): Payer: Medicaid Other | Admitting: Neurology

## 2022-10-02 ENCOUNTER — Encounter: Payer: Self-pay | Admitting: Neurology

## 2022-10-02 VITALS — BP 118/80 | HR 88 | Ht 67.0 in | Wt 225.6 lb

## 2022-10-02 DIAGNOSIS — M542 Cervicalgia: Secondary | ICD-10-CM | POA: Diagnosis not present

## 2022-10-02 DIAGNOSIS — I7774 Dissection of vertebral artery: Secondary | ICD-10-CM | POA: Diagnosis not present

## 2022-10-02 DIAGNOSIS — G4452 New daily persistent headache (NDPH): Secondary | ICD-10-CM

## 2022-10-02 MED ORDER — ALPRAZOLAM 0.25 MG PO TABS: 0.2500 mg | ORAL_TABLET | ORAL | 0 refills | Status: DC

## 2022-10-02 MED ORDER — TOPIRAMATE 50 MG PO TABS: 50.0000 mg | ORAL_TABLET | Freq: Two times a day (BID) | ORAL | 3 refills | Status: DC
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
## 2022-10-02 NOTE — Patient Instructions (Addendum)
I had a long discussion with the patient with her neck pain and headache as well as review findings of CT angiogram and possible vertebral artery dissection and answered questions.  Recommend further evaluation with checking MRI scan of the brain with and without contrast, MRI scan of the cervical spine.  Trial of Topamax 50 mg daily into 1 week increased to 50 mg twice daily as tolerated to help with the headache pain.  Aspirin and Plavix for 3 months followed by aspirin alone.  Advised to avoid lifting heavy weights and straining her neck.  Return for follow-up in 4 months or call earlier if necessary.  Vertebral Artery Dissection  Vertebral artery dissection is a tear in a vertebral artery. The vertebral arteries are major blood vessels at the base of the neck. They carry blood from the heart to the brain. When an artery tears, blood collects inside the layers of the artery wall. This can cause a blood clot. This condition increases the risk for stroke if it is not diagnosed and treated right away. It is a common cause of stroke in people who are 61-43 years old. What are the causes? This condition may be caused by: A neck injury due to sudden or too much neck movement. This is called a traumatic dissection. Having weak blood vessel walls. The walls may tear even when no injury occurs (spontaneous dissection). Chiropractic adjustments. A minimal form of blunt trauma. In many cases, the cause of this condition is not known. What increases the risk? The following factors may make you more likely to develop this condition: High blood pressure (hypertension). Migraines. Inherited diseases that affect the strength or shape of the blood vessels. Tobacco use. What are the signs or symptoms? Symptoms usually appear within days of an injury, but sometimes they may not appear for weeks or years. Common symptoms of this condition include: Headache, with a sharp, stabbing pain in the head, neck, eye, or  face. Dizziness. Vertigo. This is a feeling that you or things around you are moving when they are not. Double vision. Other symptoms include: Hoarse voice. Hearing loss. Hiccups. Loss of taste. Difficulty speaking. Loss of balance and coordination. Difficulty swallowing. Ear pain. Nausea and vomiting. Loss of feeling in the torso, legs, or arms. How is this diagnosed? This condition is diagnosed based on tests, such as: CT angiogram. This test uses a computer to take X-rays of your vertebral arteries. A dye may be injected into your blood to show the inside of your blood vessels more clearly. MRI angiogram. This is used to check the health of the blood vessels. Cerebral angiogram. This test takes X-ray images of the blood vessels in the neck and brain. A dye is used to show the inside of the blood vessels. Doppler ultrasound. This test uses sound waves to create images of the vertebral arteries. It shows how well blood flows through your arteries. How is this treated? Treatment depends on the cause of your vertebral artery dissection and on your overall health. The goal of treatment is to prevent a stroke. If you are having a stroke, it is important to get treatment quickly. Treatment may include: Blood thinners. This medicine helps to prevent blood clots. This may be given first through an IV, and then as pills for 3-6 months. Antiplatelet medicines. This medicine keeps the platelets from sticking together, which prevents blood clots from forming. Procedures to widen a narrow blood vessel (angioplasty) or to place a mesh tube (stent) inside the  blood vessel to keep it open. Surgery to repair the area. This is rarely needed. Follow these instructions at home: Medicines Take over-the-counter and prescription medicines only as told by your health care provider. If you are taking blood thinners: Talk with your health care provider before you take any medicines that contain aspirin or  NSAIDs, such as ibuprofen. These medicines increase your risk for dangerous bleeding. Take your medicine exactly as told, at the same time every day. Avoid activities that could cause injury or bruising. Follow instructions about how to prevent falls. Wear a medical alert bracelet or carry a card that lists what medicines you take. Lifestyle  Work with your health care provider to control hypertension. This may include: Exercising regularly. Check with your health care provider before starting a new type of exercise. Eating a heart-healthy diet of fruits, vegetables, whole grains, and lean meats. Limit unhealthy fats. Eat more healthy fats such as avocados, eggs, and oily fish. Reducing the amount of salt (sodium) that you eat to less than 1,500 mg a day. Reducing stress by doing things that you enjoy and avoiding things that cause you stress. Avoid activities that put you at risk for neck injuries, such as contact sports. Do not use any products that contain nicotine or tobacco. These products include cigarettes, chewing tobacco, and vaping devices, such as e-cigarettes. If you need help quitting, ask your health care provider. General instructions Do not drink alcohol if: Your health care provider tells you not to drink. You are pregnant, may be pregnant, or are planning to become pregnant. If you drink alcohol: Limit how much you have to: 0-1 drink a day for women. 0-2 drinks a day for men. Know how much alcohol is in your drink. In the [place] one drink equals one 12 oz bottle of beer (355 mL), one 5 oz glass of wine (148 mL), or one 1 oz glass of hard liquor (44 mL). Keep all follow-up visits. This is important. Get help right away if: You have any symptoms of a stroke. "BE FAST" is an easy way to remember the main warning signs of a stroke: B - Balance. Signs are dizziness, sudden trouble walking, or loss of balance. E - Eyes. Signs are trouble seeing or a sudden change in vision. F -  Face. Signs are sudden weakness or numbness of the face, or the face or eyelid drooping on one side. A - Arms. Signs are weakness or numbness in an arm. This happens suddenly and usually on one side of the body. S - Speech. Signs are sudden trouble speaking, slurred speech, or trouble understanding what people say. T - Time. Time to call emergency services. Write down what time symptoms started. You have other signs of a stroke, such as: A sudden, severe headache with no known cause. Nausea or vomiting. Seizure. You have other symptoms, such as: Difficulty breathing. Chest pain. These symptoms may be an emergency. Get help right away. Call 911. Do not wait to see if the symptoms will go away. Do not drive yourself to the hospital. Summary Vertebral artery dissection is a tear in an artery that carries blood from the heart to the brain. Symptoms usually appear within days of an injury, but sometimes they may not appear for weeks or years. This condition increases the risk for stroke if it is not diagnosed and treated right away. Treatment depends on the cause of your condition and on your overall health. The goal of treatment is to prevent  a stroke. Get help right away if you have any symptoms of a stroke. This information is not intended to replace advice given to you by your health care provider. Make sure you discuss any questions you have with your health care provider. Document Revised: 02/19/2021 Document Reviewed: 02/19/2021 Elsevier Patient Education  2024 [employer].

## 2022-10-03 ENCOUNTER — Telehealth: Payer: Self-pay | Admitting: Neurology

## 2022-10-03 NOTE — Telephone Encounter (Signed)
MRI brain [facility] medicaid auth: [id number] exp. 10/03/22-7/29-24  MR cervical [employer] medicaid auth: [id number] exp. 10/03/22-11/17/22 sent to GI [phone]

## 2022-10-08 ENCOUNTER — Telehealth: Payer: Self-pay

## 2022-10-08 NOTE — Telephone Encounter (Signed)
I called patient and left her a vm to call the office to schedule an appointment. YL,RMA

## 2022-10-14 ENCOUNTER — Ambulatory Visit (INDEPENDENT_AMBULATORY_CARE_PROVIDER_SITE_OTHER): Payer: Medicaid Other | Admitting: Family Medicine

## 2022-10-14 VITALS — BP 120/80 | HR 70 | Temp 98.4°F | Ht 68.8 in | Wt 228.8 lb

## 2022-10-14 DIAGNOSIS — R6889 Other general symptoms and signs: Secondary | ICD-10-CM

## 2022-10-14 DIAGNOSIS — Z720 Tobacco use: Secondary | ICD-10-CM

## 2022-10-14 DIAGNOSIS — Z6833 Body mass index (BMI) 33.0-33.9, adult: Secondary | ICD-10-CM

## 2022-10-14 DIAGNOSIS — I7774 Dissection of vertebral artery: Secondary | ICD-10-CM

## 2022-10-14 DIAGNOSIS — R413 Other amnesia: Secondary | ICD-10-CM

## 2022-10-14 DIAGNOSIS — Z09 Encounter for follow-up examination after completed treatment for conditions other than malignant neoplasm: Secondary | ICD-10-CM

## 2022-10-14 DIAGNOSIS — R519 Headache, unspecified: Secondary | ICD-10-CM

## 2022-10-14 DIAGNOSIS — E6609 Other obesity due to excess calories: Secondary | ICD-10-CM | POA: Diagnosis not present

## 2022-10-14 DIAGNOSIS — R011 Cardiac murmur, unspecified: Secondary | ICD-10-CM | POA: Insufficient documentation

## 2022-10-14 NOTE — Progress Notes (Signed)
I,Jameka J Llittleton,acting as a Neurosurgeon for [employer], NP.,have documented all relevant documentation on the behalf of Indi Willhite, NP,as directed by  Edsel Shives Moshe Salisbury, NP while in the presence of Kawana Hegel, NP.   Subjective:  Patient ID: Melinda Padilla , female    DOB: June 07, 1990 , 32 y.o.   MRN: [id number]  Chief Complaint  Patient presents with   ER f/u    HPI  Patient presents today for a follow up after ER visits.She reports she has been doing okay she is still having mild headaches. 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 She was referred to Neurology, she has been seen by Dr Pearlean Brownie at [facility] . Patient states she is scheduled for MRI spine on 10/19/2022 and MRI Brain in July then a follow up with the Neurologist.     Past Medical History:  Diagnosis Date   Heart murmur      Family History  Problem Relation Age of Onset   Diabetes Mother    Cancer Mother    Healthy Father    Heart failure Paternal Grandmother    Kidney failure Paternal Grandmother    Cancer Paternal Grandfather      Current Outpatient Medications:    aspirin EC 81 MG tablet, Take 1 tablet (81 mg total) by mouth daily. Swallow whole., Disp: 30 tablet, Rfl: 12   clopidogrel (PLAVIX) 75 MG tablet, Take 1 tablet (75 mg total) by mouth daily., Disp: 30 tablet, Rfl: 1   HYDROcodone-acetaminophen (NORCO/VICODIN) 5-325 MG tablet, Take 2 tablets by mouth every 4 (four) hours as needed., Disp: 10 tablet, Rfl: 0   acetaminophen (TYLENOL) 500 MG tablet, Take 2 tablets (1,000 mg total) by mouth every 8 (eight) hours as needed for up to 30 doses for mild pain or fever. (Patient not taking: Reported on 10/02/2022), Disp: 60 tablet, Rfl: 0   ALPRAZolam (XANAX) 0.25 MG tablet, Take 1 tablet (0.25 mg total) by mouth as directed. Take 1 tablet 30 mins prior to mri and  may repeat x 1 (Patient not taking: Reported on 10/14/2022), Disp: 2 tablet, Rfl: 0   topiramate (TOPAMAX) 50 MG tablet, Take 1 tablet (50 mg total) by mouth 2 (two) times daily. (Patient not taking: Reported on 10/14/2022), Disp: 60 tablet, Rfl: 3   Allergies  Allergen Reactions   Latex Hives     Review of Systems  Constitutional: Negative.   HENT: Negative.    Eyes: Negative.   Musculoskeletal:  Positive for neck pain and neck stiffness.  Skin: Negative.   Neurological:  Positive for headaches.  Psychiatric/Behavioral: Negative.       Today's Vitals   10/14/22 1415  BP: 120/80  Pulse: 70  Temp: 98.4 F (36.9 C)  Weight: 228 lb 12.8 oz (103.8 kg)  Height: 5' 8.8" (1.748 m)  PainSc: 4   PainLoc: Head   Body mass index is 33.98 kg/m.  Wt Readings from Last 3 Encounters:  10/14/22 228 lb 12.8 oz (103.8 kg)  10/02/22 225 lb 9.6 oz (102.3 kg)  09/20/22 236 lb 15.9 oz (107.5 kg)     Objective:  Physical Exam HENT:     Head:     Comments: Right side of head tender Cardiovascular:     Rate and Rhythm: Normal rate.  Pulmonary:     Effort: Pulmonary effort is  normal.  Musculoskeletal:        General: Tenderness present.  Neurological:     Mental Status: She is alert.  Psychiatric:        Mood and Affect: Mood normal.        Behavior: Behavior normal.         Assessment And Plan:  Vertebral artery dissection (HCC) Assessment & Plan: On daily aspirin and plavix; followed by [facility]   Frequent headaches Assessment & Plan: On Topiramate 50mg  BID   Tobacco abuse Assessment & Plan: Smoking cessation instruction/counseling given:  counseled patient on the dangers of tobacco use, advised patient to stop smoking, and reviewed strategies to maximize success    Impaired memory Assessment & Plan: Patient states it takes a while for her to remember things since she was diagnosed with VAD   Class 1 obesity due to excess calories with body  mass index (BMI) of 33.0 to 33.9 in adult, unspecified whether serious comorbidity present Assessment & Plan: She is encouraged to strive for BMI less than 30 to decrease cardiac risk. Advised to aim for at least 150 minutes of exercise per week.       Return in 3 months (on 01/14/2023), or if symptoms worsen or fail to improve, for smoking cessation.  Patient was given opportunity to ask questions. Patient verbalized understanding of the plan and was able to repeat key elements of the plan. All questions were answered to their satisfaction.  Daira Hine Moshe Salisbury, NP  I, Yohan Samons Moshe Salisbury, NP, have reviewed all documentation for this visit. The documentation on 10/20/22 for the exam, diagnosis, procedures, and orders are all accurate and complete.   IF YOU HAVE BEEN REFERRED TO A SPECIALIST, IT MAY TAKE 1-2 WEEKS TO SCHEDULE/PROCESS THE REFERRAL. IF YOU HAVE NOT HEARD FROM US/SPECIALIST IN TWO WEEKS, PLEASE GIVE [place] A CALL AT [phone].   THE PATIENT IS ENCOURAGED TO PRACTICE SOCIAL DISTANCING DUE TO THE COVID-19 PANDEMIC.

## 2022-10-19 ENCOUNTER — Other Ambulatory Visit: Payer: Medicaid Other

## 2022-10-19 ENCOUNTER — Ambulatory Visit
Admission: RE | Admit: 2022-10-19 | Discharge: 2022-10-19 | Disposition: A | Discharge status: Home / Self Care | Payer: Medicaid Other | Source: Ambulatory Visit | Attending: Neurology | Admitting: Neurology

## 2022-10-19 DIAGNOSIS — M542 Cervicalgia: Secondary | ICD-10-CM | POA: Diagnosis not present

## 2022-10-20 DIAGNOSIS — R413 Other amnesia: Secondary | ICD-10-CM | POA: Insufficient documentation

## 2022-10-20 DIAGNOSIS — E6609 Other obesity due to excess calories: Secondary | ICD-10-CM | POA: Insufficient documentation

## 2022-10-20 DIAGNOSIS — Z6833 Body mass index (BMI) 33.0-33.9, adult: Secondary | ICD-10-CM | POA: Insufficient documentation

## 2022-10-20 DIAGNOSIS — Z720 Tobacco use: Secondary | ICD-10-CM | POA: Insufficient documentation

## 2022-10-20 NOTE — Assessment & Plan Note (Signed)
On daily aspirin and plavix; followed by [facility]

## 2022-10-20 NOTE — Assessment & Plan Note (Signed)
She is encouraged to strive for BMI less than 30 to decrease cardiac risk. Advised to aim for at least 150 minutes of exercise per week.  

## 2022-10-20 NOTE — Assessment & Plan Note (Addendum)
On Topiramate 50mg  BID

## 2022-10-20 NOTE — Assessment & Plan Note (Addendum)
Patient states it takes a while for her to remember things since she was diagnosed with VAD

## 2022-10-20 NOTE — Assessment & Plan Note (Signed)
Smoking cessation instruction/counseling given:  counseled patient on the dangers of tobacco use, advised patient to stop smoking, and reviewed strategies to maximize success 

## 2022-10-29 ENCOUNTER — Encounter: Payer: Self-pay | Admitting: Neurology

## 2022-10-30 ENCOUNTER — Ambulatory Visit
Admission: RE | Admit: 2022-10-30 | Discharge: 2022-10-30 | Disposition: A | Discharge status: Home / Self Care | Payer: Medicaid Other | Source: Ambulatory Visit | Attending: Neurology | Admitting: Neurology

## 2022-10-30 DIAGNOSIS — I7774 Dissection of vertebral artery: Secondary | ICD-10-CM | POA: Diagnosis not present

## 2022-10-30 DIAGNOSIS — G4452 New daily persistent headache (NDPH): Secondary | ICD-10-CM | POA: Diagnosis not present

## 2022-10-30 MED ORDER — GADOPICLENOL 0.5 MMOL/ML IV SOLN
10.0000 mL | Freq: Once | INTRAVENOUS | Status: AC | PRN
  Administered 2022-10-30: 10 mL via INTRAVENOUS

## 2022-11-05 ENCOUNTER — Ambulatory Visit: Payer: Medicaid Other | Admitting: Dermatology

## 2022-11-07 NOTE — Progress Notes (Signed)
Kindly inform the patient that MRI scan of the neck shows no significant abnormality.

## 2022-11-10 ENCOUNTER — Telehealth: Payer: Self-pay

## 2022-11-10 NOTE — Telephone Encounter (Signed)
Called and spoke with patient about her MRI and she understood. Patient mention she has been experiencing episode where her memory and been confuse lately has her concerns. Patient would like to know what to do? I told patient I pass message and returned her call once I have gotten an response from the doctor.  Pt verbalized understanding. Pt had no questions at this time but was encouraged to call back if questions arise.

## 2022-11-10 NOTE — Telephone Encounter (Signed)
-----   Message from Delia Heady sent at 11/07/2022  4:46 PM EDT ----- Melinda Padilla inform the patient that MRI scan of the neck shows no significant abnormality.

## 2022-11-11 NOTE — Telephone Encounter (Signed)
Returned patient called to informed per Dr Pearlean Brownie " Kindly advise the patient to get checked by primary care physician for anxiety depression more common and reversible causes and if problem persists I would be happy to see her for further evaluation" I explain to her that life and health experience can cause so anxiety on how she feeling. Pt verbalized understanding. Pt had no questions at this time but was encouraged to call back if questions arise.

## 2022-11-13 ENCOUNTER — Encounter: Payer: Self-pay | Admitting: Family Medicine

## 2022-11-13 ENCOUNTER — Ambulatory Visit (INDEPENDENT_AMBULATORY_CARE_PROVIDER_SITE_OTHER): Payer: Medicaid Other | Admitting: Family Medicine

## 2022-11-13 VITALS — BP 124/80 | HR 76 | Temp 98.4°F | Ht 68.8 in | Wt 228.0 lb

## 2022-11-13 DIAGNOSIS — F419 Anxiety disorder, unspecified: Secondary | ICD-10-CM | POA: Diagnosis not present

## 2022-11-13 DIAGNOSIS — F330 Major depressive disorder, recurrent, mild: Secondary | ICD-10-CM | POA: Diagnosis not present

## 2022-11-13 DIAGNOSIS — F17200 Nicotine dependence, unspecified, uncomplicated: Secondary | ICD-10-CM

## 2022-11-13 DIAGNOSIS — F902 Attention-deficit hyperactivity disorder, combined type: Secondary | ICD-10-CM | POA: Diagnosis not present

## 2022-11-13 MED ORDER — BUPROPION HCL ER (SR) 150 MG PO TB12
150.0000 mg | ORAL_TABLET | Freq: Every day | ORAL | 2 refills | Status: DC
Start: 2022-11-13 — End: 2022-12-09

## 2022-11-13 NOTE — Progress Notes (Unsigned)
I,Jameka J Llittleton, CMA,acting as a Neurosurgeon for [employer], NP.,have documented all relevant documentation on the behalf of Orelia Brandstetter, NP,as directed by  Archer Vise Moshe Salisbury, NP while in the presence of Delmar Arriaga, NP.  Subjective:  Patient ID: Melinda Padilla , female    DOB: Oct 24, 1990 , 32 y.o.   MRN: [id number]  Chief Complaint  Patient presents with   Anxiety    HPI  Patient presents today for anxiety. Patient reported that her neurologist suggested that she come here to be evaluated.  Anxiety Presents for initial visit. Symptoms include confusion, decreased concentration, insomnia, nervous/anxious behavior and restlessness. Symptoms occur constantly.       Past Medical History:  Diagnosis Date   Heart murmur      Family History  Problem Relation Age of Onset   Diabetes Mother    Cancer Mother    Healthy Father    Heart failure Paternal Grandmother    Kidney failure Paternal Grandmother    Cancer Paternal Grandfather      Current Outpatient Medications:    acetaminophen (TYLENOL) 500 MG tablet, Take 2 tablets (1,000 mg total) by mouth every 8 (eight) hours as needed for up to 30 doses for mild pain or fever., Disp: 60 tablet, Rfl: 0   aspirin EC 81 MG tablet, Take 1 tablet (81 mg total) by mouth daily. Swallow whole., Disp: 30 tablet, Rfl: 12   clopidogrel (PLAVIX) 75 MG tablet, Take 1 tablet (75 mg total) by mouth daily., Disp: 30 tablet, Rfl: 1   topiramate (TOPAMAX) 50 MG tablet, Take 1 tablet (50 mg total) by mouth 2 (two) times daily., Disp: 60 tablet, Rfl: 3   Allergies  Allergen Reactions   Latex Hives     Review of Systems  Psychiatric/Behavioral:  Positive for agitation, confusion, decreased concentration and dysphoric mood. The patient is nervous/anxious and has insomnia.      Today's Vitals   11/13/22 1518  BP: 124/80  Pulse: 76  Temp: 98.4 F (36.9 C)  Weight: 228 lb (103.4 kg)  Height: 5' 8.8" (1.748 m)  PainSc: 0-No pain   Body mass index is  33.87 kg/m.  Wt Readings from Last 3 Encounters:  11/13/22 228 lb (103.4 kg)  10/14/22 228 lb 12.8 oz (103.8 kg)  10/02/22 225 lb 9.6 oz (102.3 kg)     Objective:  Physical Exam Skin:    General: Skin is warm and dry.  Neurological:     Mental Status: She is alert.  Psychiatric:        Mood and Affect: Mood is anxious and depressed. Affect is tearful.        Behavior: Behavior normal.        Thought Content: Thought content does not include homicidal or suicidal plan.         Assessment And Plan:  Anxiety  Mild episode of recurrent major depressive disorder (HCC)  Smoking addiction  ADHD (attention deficit hyperactivity disorder), combined type     No follow-ups on file.  Patient was given opportunity to ask questions. Patient verbalized understanding of the plan and was able to repeat key elements of the plan. All questions were answered to their satisfaction.  Cainan Trull Moshe Salisbury, NP  I, Cleven Jansma Moshe Salisbury, NP, have reviewed all documentation for this visit. The documentation on 11/13/22 for the exam, diagnosis, procedures, and orders are all accurate and complete.   IF YOU HAVE BEEN REFERRED TO A SPECIALIST, IT MAY TAKE 1-2 WEEKS TO SCHEDULE/PROCESS THE REFERRAL. IF YOU  HAVE NOT HEARD FROM US/SPECIALIST IN TWO WEEKS, PLEASE GIVE [place] A CALL AT [phone].   THE PATIENT IS ENCOURAGED TO PRACTICE SOCIAL DISTANCING DUE TO THE COVID-19 PANDEMIC.

## 2022-11-16 NOTE — Progress Notes (Signed)
Kindly inform the patient that brain MRI scan showed shows no significant abnormalities.  Nothing to worry about

## 2022-11-17 ENCOUNTER — Telehealth: Payer: Self-pay

## 2022-11-17 NOTE — Telephone Encounter (Signed)
Called pt and informed her per Dr Pearlean Brownie "Kindly inform the patient that brain MRI scan showed shows no significant abnormalities.  Nothing to worry about." Patient states she did received new Rx from her PCP. Pt verbalized understanding. Pt had no questions at this time but was encouraged to call back if questions arise.

## 2022-11-17 NOTE — Telephone Encounter (Signed)
-----   Message from Delia Heady sent at 11/16/2022  3:50 PM EDT ----- Joneen Roach inform the patient that brain MRI scan showed shows no significant abnormalities.  Nothing to worry about

## 2022-11-20 DIAGNOSIS — F419 Anxiety disorder, unspecified: Secondary | ICD-10-CM | POA: Insufficient documentation

## 2022-11-20 DIAGNOSIS — F902 Attention-deficit hyperactivity disorder, combined type: Secondary | ICD-10-CM | POA: Insufficient documentation

## 2022-11-20 DIAGNOSIS — F17200 Nicotine dependence, unspecified, uncomplicated: Secondary | ICD-10-CM | POA: Insufficient documentation

## 2022-11-20 DIAGNOSIS — F330 Major depressive disorder, recurrent, mild: Secondary | ICD-10-CM | POA: Insufficient documentation

## 2022-11-20 MED ORDER — ESCITALOPRAM OXALATE 10 MG PO TABS
10.0000 mg | ORAL_TABLET | Freq: Every day | ORAL | 1 refills | Status: DC
Start: 2022-11-20 — End: 2022-11-27

## 2022-11-20 NOTE — Assessment & Plan Note (Signed)
Smoking cessation counseling provided

## 2022-11-20 NOTE — Assessment & Plan Note (Addendum)
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

## 2022-11-20 NOTE — Assessment & Plan Note (Signed)
Start an SSRI

## 2022-11-24 ENCOUNTER — Ambulatory Visit: Payer: Medicaid Other | Admitting: Sports Medicine

## 2022-11-24 ENCOUNTER — Encounter: Payer: Self-pay | Admitting: Family Medicine

## 2022-11-24 ENCOUNTER — Encounter: Payer: Self-pay | Admitting: Sports Medicine

## 2022-11-24 VITALS — BP 113/80 | HR 94 | Ht 68.8 in | Wt 228.0 lb

## 2022-11-24 DIAGNOSIS — M76821 Posterior tibial tendinitis, right leg: Secondary | ICD-10-CM

## 2022-11-24 DIAGNOSIS — M79604 Pain in right leg: Secondary | ICD-10-CM | POA: Diagnosis not present

## 2022-11-24 NOTE — Progress Notes (Signed)
Melinda Padilla - 32 y.o. female MRN [id number]  Date of birth: 1990/06/25  Office Visit Note: Visit Date: 11/24/2022 PCP: [name], NP Referred by: [name], NP  Subjective: Chief Complaint  Patient presents with   Right Leg - Pain    Patient presents with increasing pain in her right lower extremity. Having pain that extends up into the medial right lower leg.  After last treatment, had great relief and only experienced aching and pain at night once off of leg. Now, has complaint of pain even when sitting.  Is not taking anything for pain.  Has tried aleve and ibuprofen with no relief.   HPI: Melinda Padilla is a pleasant 32 y.o. female who presents today for posterior medial right ankle pain.    She is status post right ankle open reduction internal fixation in 2022 by Dr. Roda Shutters. Saw back on 06/26/22 - did diagnose her with posterior tibial tendinitis and dysfunction.  At that time we did proceed with extracorporeal shockwave therapy, she did have a Medrol Dosepak.  She states with these modalities she had essentially 100% relief of her pain.  Her pain has been doing well until about the last month where it became exacerbated again.  Taking over-the-counter anti-inflammatories and muscle relaxer without much relief.  She does have halflength orthotics in her shoes which do help.  Pertinent ROS were reviewed with the patient and found to be negative unless otherwise specified above in HPI.   Assessment & Plan: Visit Diagnoses:  1. Posterior tibial tendinitis of right lower extremity   2. Pain in right leg    Plan: Discussed with Finesse it does seem that she has experienced an exacerbation of her prior posterior tibial tendinitis with PT dysfunction.  In the past she received excellent relief from extracorporeal shockwave therapy and the Medrol Dosepak.  Through shared decision-making, did repeat use ESWT today.  She will get back started on her home rehab exercises for the posterior  tibial tendon.  She will follow-up in 1 week for reassessment and possible repeat treatment.  Could consider repeat Medrol Dosepak but hold on this for now.  Follow-up: Return in about 1 week (around 12/01/2022) for for PT-tendinitis (reg visit).   Meds & Orders: No orders of the defined types were placed in this encounter.  No orders of the defined types were placed in this encounter.    Procedures: Procedure: ECSWT Indications: Posterior tibial dysfunction   Procedure Details Consent: Risks of procedure as well as the alternatives and risks of each were explained to the patient.  Verbal consent for procedure obtained. Time Out: Verified patient identification, verified procedure, site was marked, verified correct patient position. The area was cleaned with alcohol swab.     The right posterior tibial tendon was targeted for Extracorporeal shockwave therapy.    Preset: Tendinitis/tendinopathy Power Level: 40-50 MJ Frequency: 10 Hz Impulse/cycles: 2250 Head size: Regular   Patient tolerated procedure well without immediate complications.      Clinical History: No specialty comments available.  She reports that she has been smoking cigarettes. She has never used smokeless tobacco.  Recent Labs    08/21/22 1114  HGBA1C 5.5    Objective:   Vital Signs: BP 113/80   Pulse 94   Ht 5' 8.8" (1.748 m)   Wt 228 lb (103.4 kg)   LMP 11/06/2022 (Approximate)   BMI 33.87 kg/m   Physical Exam  Gen: Well-appearing, in no acute distress; non-toxic CV: Well-perfused. Warm.  Resp: Breathing unlabored on room air; no wheezing. Psych: Fluid speech in conversation; appropriate affect; normal thought process Neuro: Sensation intact throughout. No gross coordination deficits.   Ortho Exam - Right foot/ankle: Positive TTP palpating along the posterior tibial tendon course just superior and posterior to the medial malleoli into the ankle.  Patient is able to perform bilateral heel raise  although some pain at endrange plantarflexion.  There is no swelling bruising or effusion about the ankle joint.  Imaging: No results found.  Past Medical/Family/Surgical/Social History: Medications & Allergies reviewed per EMR, new medications updated. 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   Displaced fracture of lateral malleolus of right fibula, initial encounter for closed fracture    Past Medical History:  Diagnosis Date   Heart murmur    Family History  Problem Relation Age of Onset   Diabetes Mother    Cancer Mother    Healthy Father    Heart failure Paternal Grandmother    Kidney failure Paternal Grandmother    Cancer Paternal Grandfather    Past Surgical History:  Procedure Laterality Date   NO PAST SURGERIES     ORIF ANKLE FRACTURE Right 11/26/2020   Procedure: OPEN REDUCTION INTERNAL FIXATION (ORIF) RIGHT LATERAL MALLEOLUS;  Surgeon: Tarry Kos, MD;  Location: [facility];  Service: Orthopedics;  Laterality: Right;   Social History   Occupational History   Not on file  Tobacco Use   Smoking status: Every Day    Current packs/day: 0.50    Types: Cigarettes   Smokeless tobacco: Never  Vaping Use   Vaping status: Former  Substance and Sexual Activity   Alcohol use: No   Drug use: Yes    Types: Marijuana   Sexual activity: Never    Birth control/protection: None

## 2022-11-25 ENCOUNTER — Ambulatory Visit: Payer: Medicaid Other | Admitting: Orthopaedic Surgery

## 2022-11-26 ENCOUNTER — Encounter: Payer: Self-pay | Admitting: Neurology

## 2022-11-27 ENCOUNTER — Ambulatory Visit (INDEPENDENT_AMBULATORY_CARE_PROVIDER_SITE_OTHER): Payer: Medicaid Other

## 2022-11-27 ENCOUNTER — Other Ambulatory Visit: Payer: Self-pay

## 2022-11-27 ENCOUNTER — Encounter (HOSPITAL_COMMUNITY): Payer: Self-pay

## 2022-11-27 ENCOUNTER — Ambulatory Visit (HOSPITAL_COMMUNITY)
Admission: RE | Admit: 2022-11-27 | Discharge: 2022-11-27 | Disposition: A | Discharge status: Home / Self Care | Payer: Medicaid Other | Source: Ambulatory Visit | Attending: Family Medicine | Admitting: Family Medicine

## 2022-11-27 VITALS — BP 119/84 | HR 71 | Temp 99.0°F | Resp 16

## 2022-11-27 DIAGNOSIS — M79642 Pain in left hand: Secondary | ICD-10-CM

## 2022-11-27 DIAGNOSIS — S60222A Contusion of left hand, initial encounter: Secondary | ICD-10-CM

## 2022-11-27 DIAGNOSIS — F419 Anxiety disorder, unspecified: Secondary | ICD-10-CM

## 2022-11-27 MED ORDER — ESCITALOPRAM OXALATE 10 MG PO TABS
10.0000 mg | ORAL_TABLET | Freq: Every day | ORAL | 1 refills | Status: DC
Start: 2022-11-27 — End: 2022-12-09

## 2022-11-27 MED ORDER — CLOPIDOGREL BISULFATE 75 MG PO TABS: 75.0000 mg | ORAL_TABLET | Freq: Every day | ORAL | 0 refills | Status: DC

## 2022-11-27 NOTE — Discharge Instructions (Signed)
The x-ray did not show any fractures of your hand.  I think most likely this is a collection of blood from a bruise.  Please call your neurology office and see if they would think it is okay to skip 2 to 3 days of your clopidogrel.  Please do not stop that medicine or not take it without speaking to them first.  I do think it will help to ice and elevate your hand for the next few days.

## 2022-11-27 NOTE — ED Provider Notes (Signed)
[facility]    CSN: [id number] Arrival date & time: 11/27/22  1456      History   Chief Complaint Chief Complaint  Patient presents with   Hand Problem    Entered by patient    HPI Melinda Padilla is a 32 y.o. female.   HPI Here for swelling and left hand pain.  About 1 week ago she noted some swelling and tenderness of her left hand at the dorsum around the second and third MCP joints and onto the dorsum of the hand.  She has not been able to see any discoloration but she states her friend has.  No fever or chills.  No trauma recalled. She is taking Plavix and aspirin since early June  Last menstrual cycle was July 18   Past Medical History:  Diagnosis Date   Heart murmur     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      Home Medications    Prior to Admission medications   Medication Sig Start Date End Date Taking? Authorizing Provider  acetaminophen (TYLENOL) 500 MG tablet Take 2 tablets (1,000 mg total) by mouth every 8 (eight) hours as  needed for up to 30 doses for mild pain or fever. 09/07/22   Theadora Rama Scales, PA-C  aspirin EC 81 MG tablet Take 1 tablet (81 mg total) by mouth daily. Swallow whole. 09/19/22   Nira Conn, MD  buPROPion (WELLBUTRIN SR) 150 MG 12 hr tablet Take 1 tablet (150 mg total) by mouth daily. 11/13/22 11/13/23  [name], NP  clopidogrel (PLAVIX) 75 MG tablet Take 1 tablet (75 mg total) by mouth daily. 11/27/22   Micki Riley, MD  escitalopram (LEXAPRO) 10 MG tablet Take 1 tablet (10 mg total) by mouth at bedtime. 11/27/22   [name], NP  topiramate (TOPAMAX) 50 MG tablet Take 1 tablet (50 mg total) by mouth 2 (two) times daily. 10/02/22   Micki Riley, MD    Family History Family History  Problem Relation Age of Onset   Diabetes Mother    Cancer Mother    Healthy Father    Heart failure Paternal Grandmother    Kidney failure Paternal Grandmother    Cancer Paternal Grandfather     Social History Social History   Tobacco Use   Smoking status: Every Day    Current packs/day: 0.50    Types: Cigarettes   Smokeless tobacco: Never  Vaping Use   Vaping status: Former  Substance Use Topics   Alcohol use: No   Drug  use: Yes    Types: Marijuana     Allergies   Latex   Review of Systems Review of Systems   Physical Exam Triage Vital Signs ED Triage Vitals  Encounter Vitals Group     BP 11/27/22 1531 119/84     Systolic BP Percentile --      Diastolic BP Percentile --      Pulse Rate 11/27/22 1531 71     Resp 11/27/22 1531 16     Temp 11/27/22 1531 99 F (37.2 C)     Temp Source 11/27/22 1531 Oral     SpO2 11/27/22 1531 98 %     Weight --      Height --      Head Circumference --      Peak Flow --      Pain Score 11/27/22 1532 0     Pain Loc --      Pain Education --      Exclude from Growth Chart --    No data found.  Updated Vital Signs BP 119/84 (BP Location: Right Arm)   Pulse 71   Temp 99 F (37.2 C) (Oral)   Resp 16   LMP 11/06/2022  (Approximate)   SpO2 98%   Visual Acuity Right Eye Distance:   Left Eye Distance:   Bilateral Distance:    Right Eye Near:   Left Eye Near:    Bilateral Near:     Physical Exam Vitals reviewed.  Constitutional:      General: She is not in acute distress.    Appearance: She is not toxic-appearing.  Musculoskeletal:     Comments: There is swelling over the second and third MCP joints of the left hand and more pronounced in between those joints.  The swelling also extends proximally onto the dorsum of the hand and there is some very slight purplish discoloration on the outer 1 cm edge of that swelling.  Skin:    Coloration: Skin is not pale.  Neurological:     General: No focal deficit present.     Mental Status: She is alert and oriented to person, place, and time.  Psychiatric:        Behavior: Behavior normal.      UC Treatments / Results  Labs (all labs ordered are listed, but only abnormal results are displayed) Labs Reviewed - No data to display  EKG   Radiology DG Hand Complete Left  Result Date: 11/27/2022 CLINICAL DATA:  Pain, swelling and bruising for 7 days. No known injury. EXAM: LEFT HAND - COMPLETE 3+ VIEW COMPARISON:  None Available. FINDINGS: There is no evidence of fracture or dislocation. Corticated density adjacent to the radial aspect of the third digit proximal phalanx has a chronic appearance. There is no evidence of arthropathy or other focal bone abnormality. No erosive change. Soft tissue thickening overlies the dorsum of the metacarpals. IMPRESSION: 1. Soft tissue thickening overlies the dorsum of the metacarpals. No acute osseous abnormality. 2. Corticated density adjacent to the radial aspect of the third digit proximal phalanx has a chronic appearance. Electronically Signed   By: Narda Rutherford M.D.   On: 11/27/2022 16:03    Procedures Procedures (including critical care time)  Medications Ordered in UC Medications - No data to  display  Initial Impression / Assessment and Plan / UC Course  I have reviewed the triage vital signs and the nursing notes.  Pertinent labs & imaging results that were available during my care  of the patient were reviewed by me and considered in my medical decision making (see chart for details).        No acute fracture on x-ray.  Have asked her to ice and elevate the sore area.  I would like her to speak with her neurologist and see if she can get her Plavix for few days to help the hematoma improved. Final Clinical Impressions(s) / UC Diagnoses   Final diagnoses:  Left hand pain  Hematoma of left hand     Discharge Instructions      The x-ray did not show any fractures of your hand.  I think most likely this is a collection of blood from a bruise.  Please call your neurology office and see if they would think it is okay to skip 2 to 3 days of your clopidogrel.  Please do not stop that medicine or not take it without speaking to them first.  I do think it will help to ice and elevate your hand for the next few days.     ED Prescriptions   None    I have reviewed the PDMP during this encounter.   Zenia Resides, MD 11/27/22 [phone]

## 2022-11-27 NOTE — ED Triage Notes (Signed)
Pt reports she has left hand pain x 6 days   Found a lump on her left hand that is firm.

## 2022-12-02 ENCOUNTER — Ambulatory Visit: Payer: Medicaid Other | Admitting: Sports Medicine

## 2022-12-02 ENCOUNTER — Encounter: Payer: Self-pay | Admitting: Sports Medicine

## 2022-12-02 DIAGNOSIS — M79604 Pain in right leg: Secondary | ICD-10-CM

## 2022-12-02 DIAGNOSIS — M76821 Posterior tibial tendinitis, right leg: Secondary | ICD-10-CM | POA: Diagnosis not present

## 2022-12-02 DIAGNOSIS — Q666 Other congenital valgus deformities of feet: Secondary | ICD-10-CM

## 2022-12-02 NOTE — Progress Notes (Signed)
Melinda Padilla - 32 y.o. female MRN [id number]  Date of birth: 12/19/1990  Office Visit Note: Visit Date: 12/02/2022 PCP: [name], NP Referred by: [name], NP  Subjective: Chief Complaint  Patient presents with   Right Lower Leg - Pain, Follow-up    Patient went right back to work right after the ECSWT last visit - hurt worse. But then, the pain started to improve each day. Does still have dull ache in the medial high ankle region, with random sharp pains in that area with walking, with each step with the right foot.   HPI: Melinda Padilla is a pleasant 32 y.o. female who presents today for follow-up of right posterior tibial tendon tendinitis.  She is status post right ankle open reduction internal fixation in 2022 by Dr. Roda Shutters.  The previous week on 11/24/2022 we did proceed with extracorporeal shockwave therapy, she tolerated this well and felt like she had 60-70% improvement. Sharp pain is significantly improved, still having some dull, aching pain in the lower medial calf. She is doing the HEP that she was shown in the past.  Pertinent ROS were reviewed with the patient and found to be negative unless otherwise specified above in HPI.   Assessment & Plan: Visit Diagnoses:  1. Posterior tibial tendinitis of right lower extremity   2. Pain in right leg   3. Pes planovalgus    Plan: Melinda Padilla is dealing with posterior tibial tendinitis, she did receive about 60-70% relief of her pain after 1 session of extracorporeal shockwave therapy last week.  We did repeat treatment today.  She will have modified activity and rest for today and then may resume her home exercises for the posterior tibial tendon starting tomorrow.  May use Tylenol or topical medications as needed.  We will see her back in 1 week for repeat evaluation.  We did discuss getting her into some orthotics or insoles to help support her arch as she does have rather significant pes planus.  Could consider repeat Medrol  Dosepak but hold on this for now.   Follow-up: Return in about 1 week (around 12/09/2022) for for PT-tendon right (reg visit).   Meds & Orders: No orders of the defined types were placed in this encounter.  No orders of the defined types were placed in this encounter.    Procedures: Procedure: ECSWT Indications: Posterior tibial dysfunction   Procedure Details Consent: Risks of procedure as well as the alternatives and risks of each were explained to the patient.  Verbal consent for procedure obtained. Time Out: Verified patient identification, verified procedure, site was marked, verified correct patient position. The area was cleaned with alcohol swab.     The right posterior tibial tendon was targeted for Extracorporeal shockwave therapy.    Preset: Tendinitis/tendinopathy Power Level: 50 MJ Frequency: 10 Hz Impulse/cycles: 2250 Head size: Regular   Patient tolerated procedure well without immediate complications.       Clinical History: No specialty comments available.  She reports that she has been smoking cigarettes. She has never used smokeless tobacco.  Recent Labs    08/21/22 1114  HGBA1C 5.5    Objective:   Vital Signs: LMP 11/06/2022 (Approximate)   Physical Exam  Gen: Well-appearing, in no acute distress; non-toxic CV: Well-perfused. Warm.  Resp: Breathing unlabored on room air; no wheezing. Psych: Fluid speech in conversation; appropriate affect; normal thought process Neuro: Sensation intact throughout. No gross coordination deficits.   Ortho Exam - RLE/feet: There is mild TTP palpating  along the posterior tibial tendon course just superior as well as posterior to the medial malleoli.  Improved tenderness around the medial ankle.  She is able to perform bilateral heel raise, although slightly less inversion of the heel compared to the contralateral side.  No swelling or effusion about the ankle joint.  Imaging: No results found.  Past  Medical/Family/Surgical/Social History: Medications & Allergies reviewed per EMR, new medications updated. 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   Displaced fracture of lateral malleolus of right fibula, initial encounter for closed fracture    Past Medical History:  Diagnosis Date   Heart murmur    Family History  Problem Relation Age of Onset   Diabetes Mother    Cancer Mother    Healthy Father    Heart failure Paternal Grandmother    Kidney failure Paternal Grandmother    Cancer Paternal Grandfather    Past Surgical History:  Procedure Laterality Date   NO PAST SURGERIES     ORIF ANKLE FRACTURE Right 11/26/2020   Procedure: OPEN REDUCTION INTERNAL FIXATION (ORIF) RIGHT LATERAL MALLEOLUS;  Surgeon: Tarry Kos, MD;  Location: [facility];  Service: Orthopedics;  Laterality: Right;   Social History   Occupational History   Not on file  Tobacco Use   Smoking status: Every Day    Current packs/day: 0.50    Types: Cigarettes   Smokeless tobacco: Never  Vaping Use   Vaping status: Former  Substance and Sexual Activity   Alcohol use: No   Drug use: Yes    Types: Marijuana   Sexual activity: Never    Birth control/protection: None

## 2022-12-08 ENCOUNTER — Encounter: Payer: Self-pay | Admitting: Orthopaedic Surgery

## 2022-12-09 ENCOUNTER — Ambulatory Visit: Payer: Medicaid Other | Admitting: Adult Health

## 2022-12-09 ENCOUNTER — Telehealth: Payer: Self-pay | Admitting: Adult Health

## 2022-12-09 ENCOUNTER — Encounter: Payer: Self-pay | Admitting: Adult Health

## 2022-12-09 VITALS — BP 116/78 | HR 96 | Ht 70.0 in | Wt 222.0 lb

## 2022-12-09 DIAGNOSIS — R42 Dizziness and giddiness: Secondary | ICD-10-CM

## 2022-12-09 DIAGNOSIS — I7774 Dissection of vertebral artery: Secondary | ICD-10-CM

## 2022-12-09 DIAGNOSIS — H538 Other visual disturbances: Secondary | ICD-10-CM

## 2022-12-09 DIAGNOSIS — G44309 Post-traumatic headache, unspecified, not intractable: Secondary | ICD-10-CM

## 2022-12-09 DIAGNOSIS — S0990XD Unspecified injury of head, subsequent encounter: Secondary | ICD-10-CM

## 2022-12-09 NOTE — Telephone Encounter (Signed)
6 months
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
## 2022-12-09 NOTE — Patient Instructions (Addendum)
Your Plan:  Referral placed to ophthalmology for vision related concerns   Please let me know if you would like to do physical therapy for dizziness/balance concerns  Please let me if you would like to start on any daily medications for headaches  Continue aspirin and plavix for additional 1 month then aspirin alone  Will repeat CT imaging of your neck vessels next month for follow up on left vertebral artery dissection - you will be called to schedule     Follow up in 4 months or call earlier if needed      Thank you for coming to see [place] at [facility]. I hope we have been able to provide you high quality care today.  You may receive a patient satisfaction survey over the next few weeks. We would appreciate your feedback and comments so that we may continue to improve ourselves and the health of our patients.   Post-Concussion Syndrome  A concussion is a brain injury from a direct hit to the head or body. This hit causes the brain to shake back and forth fast inside the skull. The shaking can damage brain cells and cause chemical changes in the brain. Concussions are normally not life-threatening but can cause serious symptoms. Post-concussion syndrome is when symptoms that happen after a concussion last longer than normal. These symptoms can last from weeks to months. What are the causes? The cause of this condition is not known. It can happen whether your head injury was mild or severe. What increases the risk? You are more likely to get this condition if: You are female. You are a child, teen, or young adult. You have had a head injury before. You have a history of headaches. You have depression or anxiety. You have more than one symptom or severe symptoms when your concussion occurs. You faint or cannot remember the event (have amnesia of the event). What are the signs or symptoms? Symptoms of this condition include: Physical symptoms, such  as: Headaches. Tiredness. Dizziness and weakness. Blurred vision and sensitivity to light. Trouble hearing. Problems with balance. Mental and emotional symptoms, such as: Memory problems and trouble focusing. Trouble falling asleep or staying asleep (insomnia). Feeling irritable. Anxiety or depression. Trouble learning new things. How is this diagnosed? This condition may be diagnosed based on: Your symptoms. A description of your injury. Your medical history. Testing your strength, balance, and nerve function (neurological exam). Your health care provider may order other tests. These may include: Brain imaging, such as a CT scan or an MRI. Memory testing (neuropsychological testing). How is this treated? Treatment for this condition may depend on your symptoms. Symptoms normally go away on their own with time. If you need treatment, it may include: Medicines for headaches, anxiety, depression, and insomnia. Resting your brain and body for a few days after your injury. Rehab therapy, such as: Physical or occupational therapy. This may include exercises to help with balance and dizziness. Mental health counseling. A form of talk therapy called cognitive behavioral therapy (CBT) can be especially helpful. This therapy helps you set goals and follow up on the changes that you make. Speech therapy. Vision therapy. A brain and eye specialist can recommend treatments for vision problems. Follow these instructions at home: Medicines Take over-the-counter and prescription medicines only as told by your health care provider. Avoid opioid prescription pain medicines when getting over a concussion. Activity Limit your mental activities for the first few days after your injury. This may include  not doing these things: Homework or work for your job. Complex thinking. Watching TV. Using a computer or phone. Playing memory games and puzzles. Slowly return to your normal activity level. If  a certain activity brings on your symptoms, stop or slow down until you can do the activity without getting symptoms again. Limit physical activity, such as sports or strenuous activities, for the first few days after a concussion. Slowly return to normal activity as told by your health care provider. Light exercise may be helpful. Rest helps your brain heal. Make sure you: Get plenty of sleep at night. Most adults should get at least 7-9 hours of sleep each night. Rest during the day. Take naps or rest breaks when you feel tired. Do not do high-risk activities that could cause a second concussion, such as riding a bike or playing sports. Having another concussion before the first one has healed can be harmful. General instructions  Do not drink alcohol until your health care provider says that you can. Keep track of how severe your symptoms are and how often they happen. Give this information to your health care provider. Keep all follow-up visits. Your health care provider may need to check you for new or serious symptoms. Where to find more information Concussion [employer] Foundation: concussionfoundation.org Contact a health care provider if: Your symptoms do not improve. You get injured again. You have unusual behavior changes. Get help right away if: You have a severe or worsening headache. You have weakness or numbness in any part of your body. You vomit repeatedly. You have mental status changes, such as: Confusion. Trouble speaking. Trouble staying awake. Fainting. You have a seizure. These symptoms may be an emergency. Get help right away. Call 911. Do not wait to see if the symptoms will go away. Do not drive yourself to the hospital. Also, get help right away if: You think about hurting yourself or others. Take one of these steps if you feel like you may hurt yourself or others, or have thoughts about taking your own life: Go to your nearest emergency room. Call 911. Call  the National Suicide Prevention Lifeline at 6231003557 or 988. This is open 24 hours a day. Text the Crisis Text Line at 318-342-5792. This information is not intended to replace advice given to you by your health care provider. Make sure you discuss any questions you have with your health care provider. Document Revised: 08/30/2021 Document Reviewed: 08/30/2021 Elsevier Patient Education  2024 [employer].

## 2022-12-09 NOTE — Telephone Encounter (Signed)
Referral for ophthalmology fax to [facility]. Phone: [phone], Fax: [phone].

## 2022-12-11 ENCOUNTER — Encounter: Payer: Self-pay | Admitting: Sports Medicine

## 2022-12-11 ENCOUNTER — Ambulatory Visit: Payer: Medicaid Other | Admitting: Sports Medicine

## 2022-12-11 DIAGNOSIS — M19071 Primary osteoarthritis, right ankle and foot: Secondary | ICD-10-CM

## 2022-12-11 DIAGNOSIS — M76821 Posterior tibial tendinitis, right leg: Secondary | ICD-10-CM | POA: Diagnosis not present

## 2022-12-11 DIAGNOSIS — M25571 Pain in right ankle and joints of right foot: Secondary | ICD-10-CM

## 2022-12-11 DIAGNOSIS — Q666 Other congenital valgus deformities of feet: Secondary | ICD-10-CM | POA: Diagnosis not present

## 2022-12-11 MED ORDER — METHYLPREDNISOLONE 4 MG PO TBPK: ORAL_TABLET | ORAL | 0 refills | Status: DC

## 2022-12-11 NOTE — Progress Notes (Signed)
Melinda Padilla - 32 y.o. female MRN [id number]  Date of birth: Dec 26, 1990  Office Visit Note: Visit Date: 12/11/2022 PCP: [name], NP Referred by: [name], NP  Subjective: Chief Complaint  Patient presents with   Right Leg - Follow-up   HPI: Melinda Padilla is a pleasant 32 y.o. female who presents today for follow-up of right posterior tibial tendon tendinitis.   She is status post right ankle open reduction internal fixation in 2022 by Dr. Roda Shutters.  We have performed 2 sessions of extracorporeal shockwave therapy for her PT tendon, she initially found good benefit from this, but did not notice much improvement after the last session.  We did discuss the benefit of orthotics to help support her arch, she is interested in this today. She is having pain over the origin of the posterior tendon but also states she has some pain deep within the anterior ankle joint.  She does endorse some burning and/or tingling sensation from her previous incision site.  Pertinent ROS were reviewed with the patient and found to be negative unless otherwise specified above in HPI.   Assessment & Plan: Visit Diagnoses:  1. Pain in right ankle and joints of right foot   2. Posterior tibial tendon dysfunction (PTTD) of right lower extremity   3. Arthritis of midtarsal joint of right foot   4. Pes planovalgus    Plan: Discussed with Heavan the nature of her right ankle pain which I think is multifactorial in nature.  She is dealing with posterior tibial tendon dysfunction with likely tendinitis.  We did repeat extracorporeal shockwave treatment therapy for this today.  I also think she is dealing with some sequelae of her previous ankle fracture she does have some midfoot arthritis at the ankle joint itself does seem to be slightly irritated.  She has no effusion about the ankle.  She has significant pes planus and we discussed upgrading her orthotics/insoles to help support her longitudinal arch.  We  did fit her for ones today, but did not proceed with this given financial concerns.  She may purchase these over-the-counter or we could send referral to [facility] if desires.  Given her pain and the flare of her PT tendinitis, we did proceed with a 6-day Medrol Dosepak which she will complete and then discontinue.  I would like to see her back in about 2 weeks to see how she is progressing.  Additional treatment considerations: Referral to formalized physical therapy, orthotics  *Lower threshold but could consider IA-ankle injection or PT tendon sheath inj  Follow-up: Return in about 2 weeks (around 12/25/2022) for for R-ankle (reg visit).   Meds & Orders:  Meds ordered this encounter  Medications   methylPREDNISolone (MEDROL DOSEPAK) 4 MG TBPK tablet    Sig: Take per packet instruction. Taper dosing.    Dispense:  1 each    Refill:  0   No orders of the defined types were placed in this encounter.    Procedures: Procedure: ECSWT Indications: Posterior tibial dysfunction   Procedure Details Consent: Risks of procedure as well as the alternatives and risks of each were explained to the patient.  Verbal consent for procedure obtained. Time Out: Verified patient identification, verified procedure, site was marked, verified correct patient position. The area was cleaned with alcohol swab.     The right posterior tibial tendon was targeted for Extracorporeal shockwave therapy.    Preset: Tendinitis/tendinopathy Power Level: 60 MJ Frequency: 10 Hz Impulse/cycles: 2200 Head size: Regular  Patient tolerated procedure well without immediate complications.      Clinical History: No specialty comments available.  She reports that she has been smoking cigarettes. She has never used smokeless tobacco.  Recent Labs    08/21/22 1114  HGBA1C 5.5    Objective:   Vital Signs: LMP 11/06/2022 (Approximate)   Physical Exam  Gen: Well-appearing, in no acute distress; non-toxic CV:   Well-perfused. Warm.  Resp: Breathing unlabored on room air; no wheezing. Psych: Fluid speech in conversation; appropriate affect; normal thought process Neuro: Sensation intact throughout. No gross coordination deficits.   Ortho Exam - Bilateral feet: There is notable bilateral pes planus that is moderate to severe.  There is tenderness to palpation along the course of the posterior tibial tendon just posterior to the medial malleoli.  There is also some pain palpating deep within the anterior joint and midfoot region.  No redness swelling or bruising of the ankle.  Well-healed scar from her previous ankle ORIF.   Imaging:  Narrative & Impression  CLINICAL DATA:  Postop pain right ankle   EXAM: RIGHT ANKLE - COMPLETE 3+ VIEW   COMPARISON:  04/08/2022   FINDINGS: Previous ORIF of the right distal fibula. Stable hardware and alignment. No acute osseous finding. Similar degenerative bony spurring of the talus and tarsal bones on the lateral view. No focal soft tissue abnormality.   IMPRESSION: Stable postoperative and degenerative changes as above. No acute finding by plain radiography.     Electronically Signed   By: Judie Petit.  Shick M.D.   On: 06/20/2022 09:58      Past Medical/Family/Surgical/Social History: Medications & Allergies reviewed per EMR, new medications updated. 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   Displaced fracture of lateral malleolus of right fibula, initial encounter for closed fracture     Past Medical History:  Diagnosis Date   Heart murmur    Family History  Problem Relation Age of Onset   Diabetes Mother    Cancer Mother    Healthy Father    Heart failure Paternal Grandmother    Kidney failure Paternal Grandmother    Cancer Paternal Grandfather    Past Surgical History:  Procedure Laterality Date   NO PAST SURGERIES     ORIF ANKLE FRACTURE Right 11/26/2020   Procedure: OPEN REDUCTION INTERNAL FIXATION (ORIF) RIGHT LATERAL MALLEOLUS;  Surgeon: Tarry Kos, MD;  Location: [facility];  Service: Orthopedics;  Laterality: Right;   Social History   Occupational History   Not on file  Tobacco Use   Smoking status: Every Day    Current packs/day: 0.50    Types: Cigarettes   Smokeless tobacco: Never  Vaping Use   Vaping status: Former  Substance and Sexual Activity   Alcohol use: No   Drug use: Yes    Types: Marijuana   Sexual activity: Never    Birth control/protection: None

## 2022-12-11 NOTE — Progress Notes (Signed)
Last shockwave did not help as much She is interested in the insoles we have here

## 2022-12-18 ENCOUNTER — Encounter: Payer: Self-pay | Admitting: Family Medicine

## 2022-12-23 ENCOUNTER — Other Ambulatory Visit: Payer: Self-pay | Admitting: Adult Health

## 2022-12-23 DIAGNOSIS — I7774 Dissection of vertebral artery: Secondary | ICD-10-CM

## 2022-12-25 ENCOUNTER — Telehealth: Payer: Self-pay | Admitting: Adult Health

## 2022-12-25 ENCOUNTER — Ambulatory Visit: Payer: Self-pay | Admitting: Family Medicine

## 2022-12-25 ENCOUNTER — Encounter: Payer: Self-pay | Admitting: Sports Medicine

## 2022-12-25 ENCOUNTER — Ambulatory Visit: Payer: Medicaid Other | Admitting: Sports Medicine

## 2022-12-25 DIAGNOSIS — S82891S Other fracture of right lower leg, sequela: Secondary | ICD-10-CM

## 2022-12-25 DIAGNOSIS — M76821 Posterior tibial tendinitis, right leg: Secondary | ICD-10-CM

## 2022-12-25 DIAGNOSIS — M25571 Pain in right ankle and joints of right foot: Secondary | ICD-10-CM | POA: Diagnosis not present

## 2022-12-25 DIAGNOSIS — Q666 Other congenital valgus deformities of feet: Secondary | ICD-10-CM | POA: Diagnosis not present

## 2022-12-25 MED ORDER — METHYLPREDNISOLONE 4 MG PO TBPK: ORAL_TABLET | ORAL | 0 refills | Status: DC

## 2022-12-25 NOTE — Telephone Encounter (Signed)
[employer] medicaid Berkley Harvey: [id number] exp. 12/25/22-02/08/23 sent to [facility] [phone]

## 2022-12-25 NOTE — Progress Notes (Addendum)
Melinda Padilla - 32 y.o. female MRN [id number]  Date of birth: 1991/04/15  Office Visit Note: Visit Date: 12/25/2022 PCP: [name], NP Referred by: [name], NP  Subjective: No chief complaint on file.  HPI: Melinda Padilla is a pleasant 32 y.o. female who presents today for follow-up of right ankle and PT-tendonitis  She is status post right ankle open reduction internal fixation in 2022 by Dr. Roda Shutters.   At last visit we did fit her for orthotics with scaphoid pad to help support her arch, she does feel like this has been somewhat beneficial -no longer having any pain in the arch of her foot.  She unfortunately did not pick up the prednisone prescription described for her at last visit.  Does get some pain in the crease of her ankle joint but more of her pain is over the medial aspect of the PT tendon.  Pertinent ROS were reviewed with the patient and found to be negative unless otherwise specified above in HPI.   Assessment & Plan: Visit Diagnoses:  1. Pain in right ankle and joints of right foot   2. Posterior tibial tendon dysfunction (PTTD) of right lower extremity   3. Closed fracture of right ankle, sequela   4. Pes planovalgus    Plan: Keyosha is dealing with her posterior tibial tendon tendinitis/dysfunction.  We did send in a 6-day methylprednisolone course as she was unable to start this at previous visit.  She will take this for 6 days and then discontinue.  She will continue her home rehab exercises.  Do think with prednisone this will hopefully resolve her tendinitis.  She does have some lingering pain within the ankle joint itself which is likely from her previous injury and surgery, but more of her pain seems to be emanating from the medial side of the tendon.  She will continue in her orthotic insoles as this is supporting her arch and offloading her foot pain.  She will notify me in 1-2 weeks of the degree of improvement after the medicine and home exercises.   Additional treatment considerations could be ultrasound-guided PT tendon sheath injection but will hold on this for now.  If the ankle joint itself continues to be bothersome, could consider sending her back to Dr. Roda Shutters her operating surgeon for follow-up  Follow-up: Return for will mychart message me in 1-2 weeks with ankle update.   Meds & Orders:  Meds ordered this encounter  Medications   methylPREDNISolone (MEDROL DOSEPAK) 4 MG TBPK tablet    Sig: Take per packet instruction. Taper dosing.    Dispense:  1 each    Refill:  0   No orders of the defined types were placed in this encounter.    Procedures: No procedures performed      Clinical History: No specialty comments available.  She reports that she has been smoking cigarettes. She has never used smokeless tobacco.  Recent Labs    08/21/22 1114  HGBA1C 5.5    Objective:    Physical Exam  Gen: Well-appearing, in no acute distress; non-toxic CV:  Well-perfused. Warm.  Resp: Breathing unlabored on room air; no wheezing. Psych: Fluid speech in conversation; appropriate affect; normal thought process Neuro: Sensation intact throughout. No gross coordination deficits.   Ortho Exam - Right foot/ankle: There is notable bilateral pes planus that is moderate to severe.  There is tenderness to palpation along the course of the posterior tibial tendon just posterior to the medial malleoli.  There is  also some pain palpating deep within the anterior joint, no effusion.  No redness swelling or bruising of the ankle.  Well-healed scar from her previous ankle ORIF.    Imaging: No results found.  Past Medical/Family/Surgical/Social History: Medications & Allergies reviewed per EMR, new medications updated. 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   Displaced fracture of lateral malleolus of right fibula, initial encounter for closed fracture    Past Medical History:  Diagnosis Date   Heart murmur    Family History  Problem Relation Age of Onset   Diabetes Mother    Cancer Mother    Healthy Father    Heart failure Paternal Grandmother    Kidney failure Paternal Grandmother    Cancer Paternal Grandfather    Past Surgical History:  Procedure Laterality Date   NO PAST SURGERIES     ORIF ANKLE FRACTURE Right 11/26/2020   Procedure: OPEN REDUCTION INTERNAL FIXATION (ORIF) RIGHT LATERAL MALLEOLUS;  Surgeon: Tarry Kos, MD;  Location: [facility];  Service: Orthopedics;  Laterality: Right;   Social History   Occupational History   Not on file  Tobacco Use   Smoking status: Every Day    Current packs/day: 0.50    Types: Cigarettes   Smokeless tobacco: Never  Vaping Use   Vaping status: Former  Substance and Sexual Activity   Alcohol use: No   Drug use: Yes    Types: Marijuana   Sexual activity: Never    Birth control/protection: None

## 2022-12-31 ENCOUNTER — Ambulatory Visit
Admission: RE | Admit: 2022-12-31 | Discharge: 2022-12-31 | Disposition: A | Discharge status: Home / Self Care | Payer: Medicaid Other | Source: Ambulatory Visit | Attending: Adult Health

## 2022-12-31 DIAGNOSIS — I7774 Dissection of vertebral artery: Secondary | ICD-10-CM

## 2022-12-31 MED ORDER — IOPAMIDOL (ISOVUE-370) INJECTION 76%
75.0000 mL | Freq: Once | INTRAVENOUS | Status: AC | PRN
  Administered 2022-12-31: 75 mL via INTRAVENOUS

## 2023-01-07 ENCOUNTER — Encounter: Payer: Self-pay | Admitting: Dermatology

## 2023-01-08 ENCOUNTER — Ambulatory Visit: Payer: Medicaid Other | Admitting: Dermatology

## 2023-01-08 ENCOUNTER — Ambulatory Visit (INDEPENDENT_AMBULATORY_CARE_PROVIDER_SITE_OTHER): Payer: Medicaid Other | Admitting: Dermatology

## 2023-01-08 ENCOUNTER — Encounter: Payer: Self-pay | Admitting: Dermatology

## 2023-01-08 VITALS — BP 112/76 | HR 77

## 2023-01-08 DIAGNOSIS — L309 Dermatitis, unspecified: Secondary | ICD-10-CM | POA: Diagnosis not present

## 2023-01-08 DIAGNOSIS — D239 Other benign neoplasm of skin, unspecified: Secondary | ICD-10-CM

## 2023-01-08 DIAGNOSIS — D489 Neoplasm of uncertain behavior, unspecified: Secondary | ICD-10-CM

## 2023-01-08 DIAGNOSIS — D235 Other benign neoplasm of skin of trunk: Secondary | ICD-10-CM | POA: Diagnosis not present

## 2023-01-08 MED ORDER — CLOBETASOL PROPIONATE 0.05 % EX OINT: 1.0000 | TOPICAL_OINTMENT | Freq: Two times a day (BID) | CUTANEOUS | 3 refills | Status: DC

## 2023-01-08 NOTE — Progress Notes (Signed)
   New Patient Visit   Subjective  Melinda Padilla is a 32 y.o. female who presents for the following: Congenital Nevus and hand irritation  Patient states she has nevus on her abdomen and skin irritation located at the hands that she would like to have examined. Patient reports the areas have been there for several years. She reports the areas are bothersome.Patient rates irritation 10 out of 10. She states that the areas have not spread. Patient reports she has not previously been treated for these areas. Patient denies Hx of bx. Patient denies family history of skin cancer(s).  The patient has spots, moles and lesions to be evaluated, some may be new or changing and the patient may have concern these could be cancer.   The following portions of the chart were reviewed this encounter and updated as appropriate: medications, allergies, medical history  Review of Systems:  No other skin or systemic complaints except as noted in HPI or Assessment and Plan.  Objective  Well appearing patient in no apparent distress; mood and affect are within normal limits.  A focused examination was performed of the following areas: Abdomen and hands  Relevant exam findings are noted in the Assessment and Plan.  Right Abdomen (side) - Upper Linear brown papule with cobblestoning    Assessment & Plan   Epidermal NEVI Exam: Tan-brown and/or pink-flesh-colored symmetric macules and papules  Treatment Plan: -Shave biopsy for removal and histological  evaluation   Hand Dermatitis Exam: No active lesions today  Treatment Plan: -Reminded pt of the importance of avoiding harsh dish soaps and cleaning supplies (she must wear gloves when cleaning)  -No hot water when washing hands and must apply moisturizer immediately after -We will prescribe Clobetasol to apply 2 times daily for 2 weeks then STOP   Hand eczema  Related Medications clobetasol ointment (TEMOVATE) 0.05 % Apply 1 Application  topically 2 (two) times daily.  Neoplasm of uncertain behavior Right Abdomen (side) - Upper  Skin / nail biopsy Type of biopsy: tangential   Informed consent: discussed and consent obtained   Timeout: patient name, date of birth, surgical site, and procedure verified   Procedure prep:  Patient was prepped and draped in usual sterile fashion Prep type:  Isopropyl alcohol Anesthesia: the lesion was anesthetized in a standard fashion   Anesthetic:  1% lidocaine w/ epinephrine 1-100,000 buffered w/ 8.4% NaHCO3 Instrument used: DermaBlade   Hemostasis achieved with: aluminum chloride   Outcome: patient tolerated procedure well   Post-procedure details: sterile dressing applied and wound care instructions given   Dressing type: petrolatum gauze and bandage    Specimen 1 - Surgical pathology Differential Diagnosis: Epidermal Nevus  Check Margins: No    No follow-ups on file.   Documentation: I have reviewed the above documentation for accuracy and completeness, and I agree with the above.  Stasia Cavalier, am acting as scribe for Langston Reusing, DO.   Langston Reusing, DO

## 2023-01-08 NOTE — Patient Instructions (Addendum)

## 2023-01-12 LAB — SURGICAL PATHOLOGY

## 2023-01-13 ENCOUNTER — Other Ambulatory Visit: Payer: Medicaid Other

## 2023-01-13 ENCOUNTER — Encounter: Payer: Self-pay | Admitting: Dermatology

## 2023-01-13 NOTE — Progress Notes (Signed)
Hi Melinda Padilla  Dr. 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  No additional treatment is required.  The detailed report is available to view in MyChart.  Have a great day!  Kind Regards,  Dr. Kermit Balo Care Team

## 2023-01-14 ENCOUNTER — Other Ambulatory Visit (HOSPITAL_COMMUNITY)
Admission: RE | Admit: 2023-01-14 | Discharge: 2023-01-14 | Disposition: A | Discharge status: Home / Self Care | Payer: Medicaid Other | Source: Ambulatory Visit | Attending: Family Medicine | Admitting: Family Medicine

## 2023-01-14 ENCOUNTER — Ambulatory Visit: Payer: Medicaid Other | Admitting: Dermatology

## 2023-01-14 ENCOUNTER — Encounter: Payer: Self-pay | Admitting: Family Medicine

## 2023-01-14 ENCOUNTER — Ambulatory Visit (INDEPENDENT_AMBULATORY_CARE_PROVIDER_SITE_OTHER): Payer: Medicaid Other | Admitting: Family Medicine

## 2023-01-14 VITALS — BP 102/80 | HR 98 | Temp 98.1°F | Ht 70.0 in | Wt 222.0 lb

## 2023-01-14 DIAGNOSIS — H6121 Impacted cerumen, right ear: Secondary | ICD-10-CM | POA: Diagnosis not present

## 2023-01-14 DIAGNOSIS — Z2821 Immunization not carried out because of patient refusal: Secondary | ICD-10-CM

## 2023-01-14 DIAGNOSIS — H938X1 Other specified disorders of right ear: Secondary | ICD-10-CM

## 2023-01-14 DIAGNOSIS — Z01419 Encounter for gynecological examination (general) (routine) without abnormal findings: Secondary | ICD-10-CM | POA: Insufficient documentation

## 2023-01-14 DIAGNOSIS — Z6831 Body mass index (BMI) 31.0-31.9, adult: Secondary | ICD-10-CM | POA: Diagnosis not present

## 2023-01-14 DIAGNOSIS — Z1211 Encounter for screening for malignant neoplasm of colon: Secondary | ICD-10-CM

## 2023-01-14 DIAGNOSIS — E6609 Other obesity due to excess calories: Secondary | ICD-10-CM

## 2023-01-14 DIAGNOSIS — Z113 Encounter for screening for infections with a predominantly sexual mode of transmission: Secondary | ICD-10-CM

## 2023-01-14 LAB — POC HEMOCCULT BLD/STL (OFFICE/1-CARD/DIAGNOSTIC): Fecal Occult Blood, POC: NEGATIVE

## 2023-01-14 NOTE — Telephone Encounter (Signed)
The area is healing well.  That's not pus but granulation tissue and fibrin (all part of the normal healing process)  It's very important she keep the wound covered in vaseline or aquaphor at all times to expedite healing.  -Dr. Onalee Hua

## 2023-01-14 NOTE — Progress Notes (Addendum)
I,Jameka J Llittleton, CMA,acting as a Neurosurgeon for [employer], NP.,have documented all relevant documentation on the behalf of [name], NP,as directed by  [name], NP while in the presence of [name], NP.  Subjective:  Patient ID: Melinda Padilla , female    DOB: 10-31-1990 , 32 y.o.   MRN: [id number]  Chief Complaint  Patient presents with   Otalgia    HPI  Patient presents today for a pap smear. She has no complaints of frequency,urgency or foul discharge. Patient states that in the past one week, she has noticed that the  sounds from her right ear are muffled, will have it cleaned out.     Past Medical History:  Diagnosis Date   Heart murmur      Family History  Problem Relation Age of Onset   Diabetes Mother    Cancer Mother    Healthy Father    Heart failure Paternal Grandmother    Kidney failure Paternal Grandmother    Cancer Paternal Grandfather      Current Outpatient Medications:    aspirin EC 81 MG tablet, Take 1 tablet (81 mg total) by mouth daily. Swallow whole., Disp: 30 tablet, Rfl: 12   clobetasol ointment (TEMOVATE) 0.05 %, Apply 1 Application topically 2 (two) times daily., Disp: 60 g, Rfl: 3   Allergies  Allergen Reactions   Latex Hives   Lexapro [Escitalopram] Diarrhea and Nausea Only     Review of Systems  Constitutional: Negative.   HENT:  Positive for ear pain.   Eyes: Negative.   Cardiovascular: Negative.   Gastrointestinal: Negative.   Musculoskeletal: Negative.   Skin: Negative.   Psychiatric/Behavioral: Negative.       Today's Vitals   01/14/23 1541  BP: 102/80  Pulse: 98  Temp: 98.1 F (36.7 C)  Weight: 222 lb (100.7 kg)  Height: 5\' 10"  (1.778 m)  PainSc: 0-No pain   Body mass index is 31.85 kg/m.  Wt Readings from Last 3 Encounters:  01/14/23 222 lb (100.7 kg)  12/09/22 222 lb (100.7 kg)  11/24/22 228 lb (103.4 kg)    The ASCVD Risk score (Arnett DK, et al., 2019) failed to calculate for the following  reasons:   The 2019 ASCVD risk score is only valid for ages 49 to 1  Objective:  Physical Exam Exam conducted with a chaperone present [place] [facility] CMA).  HENT:     Right Ear: Tympanic membrane normal. There is impacted cerumen.     Left Ear: Tympanic membrane normal.     Ears:     Comments: Right ear was gently irrigated with warm water solution, removing ear wax.Procedure was well tolerated and  Patient stated she could hear better after the procedure.  Genitourinary:    General: Normal vulva.     Exam position: Supine.     Labia:        Right: No tenderness.        Left: No tenderness.      Vagina: No signs of injury and foreign body. No vaginal discharge, erythema, tenderness, bleeding, lesions or prolapsed vaginal walls.     Cervix: No discharge, lesion, erythema or cervical bleeding.     Adnexa:        Right: No tenderness.         Left: No tenderness.       Rectum: Normal. Guaiac result negative. No mass, tenderness or anal fissure.     Comments: Procedure was well tolerated and specimen sent  off to the laboratory Neurological:     Mental Status: She is alert.         Assessment And Plan:  Cerumen debris on tympanic membrane, right Assessment & Plan: Right ear was cleaned of cerumen and procedure was well tolerated.   Encounter for gynecological examination without abnormal finding -     Cytology - PAP  Screening examination for STD (sexually transmitted disease) -     NuSwab Vaginitis Plus (VG+)  Influenza vaccination declined  COVID-19 vaccination declined  Encounter for Hemoccult screening -     POC Hemoccult Bld/Stl (1-Cd Office Dx)  Class 1 obesity due to excess calories with body mass index (BMI) of 31.0 to 31.9 in adult, unspecified whether serious comorbidity present    Return in 3 months (on 04/15/2023), or if symptoms worsen or fail to improve, for physical.  Patient was given opportunity to ask questions. Patient verbalized understanding  of the plan and was able to repeat key elements of the plan. All questions were answered to their satisfaction.    I, [name], NP, have reviewed all documentation for this visit. The documentation on 01/20/23 for the exam, diagnosis, procedures, and orders are all accurate and complete.   IF YOU HAVE BEEN REFERRED TO A SPECIALIST, IT MAY TAKE 1-2 WEEKS TO SCHEDULE/PROCESS THE REFERRAL. IF YOU HAVE NOT HEARD FROM US/SPECIALIST IN TWO WEEKS, PLEASE GIVE [place] A CALL AT [phone].

## 2023-01-17 LAB — NUSWAB VAGINITIS PLUS (VG+)
Candida albicans, NAA: NEGATIVE
Candida glabrata, NAA: NEGATIVE
Chlamydia trachomatis, NAA: NEGATIVE
Neisseria gonorrhoeae, NAA: NEGATIVE
Trich vag by NAA: POSITIVE — AB

## 2023-01-19 LAB — CYTOLOGY - PAP
Adequacy: ABSENT
Diagnosis: NEGATIVE

## 2023-01-20 DIAGNOSIS — H6121 Impacted cerumen, right ear: Secondary | ICD-10-CM | POA: Insufficient documentation

## 2023-01-20 NOTE — Assessment & Plan Note (Signed)
Right ear was cleaned of cerumen and procedure was well tolerated.

## 2023-01-22 ENCOUNTER — Ambulatory Visit: Payer: Self-pay | Admitting: Family Medicine

## 2023-01-23 ENCOUNTER — Other Ambulatory Visit: Payer: Self-pay | Admitting: Family Medicine

## 2023-01-23 DIAGNOSIS — A5901 Trichomonal vulvovaginitis: Secondary | ICD-10-CM

## 2023-01-23 MED ORDER — METRONIDAZOLE 500 MG PO TABS: 500.0000 mg | ORAL_TABLET | Freq: Two times a day (BID) | ORAL | 0 refills | Status: AC

## 2023-01-27 ENCOUNTER — Encounter: Payer: Self-pay | Admitting: Sports Medicine

## 2023-02-03 ENCOUNTER — Encounter: Payer: Self-pay | Admitting: Plastic Surgery

## 2023-02-03 ENCOUNTER — Ambulatory Visit (INDEPENDENT_AMBULATORY_CARE_PROVIDER_SITE_OTHER): Payer: Medicaid Other | Admitting: Plastic Surgery

## 2023-02-03 VITALS — BP 118/83 | HR 70 | Ht 70.0 in | Wt 219.0 lb

## 2023-02-03 DIAGNOSIS — N62 Hypertrophy of breast: Secondary | ICD-10-CM | POA: Diagnosis not present

## 2023-02-03 DIAGNOSIS — Z6831 Body mass index (BMI) 31.0-31.9, adult: Secondary | ICD-10-CM

## 2023-02-03 DIAGNOSIS — M545 Low back pain, unspecified: Secondary | ICD-10-CM | POA: Diagnosis not present

## 2023-02-03 DIAGNOSIS — F17200 Nicotine dependence, unspecified, uncomplicated: Secondary | ICD-10-CM | POA: Diagnosis not present

## 2023-02-03 DIAGNOSIS — M549 Dorsalgia, unspecified: Secondary | ICD-10-CM | POA: Insufficient documentation

## 2023-02-03 DIAGNOSIS — Z720 Tobacco use: Secondary | ICD-10-CM

## 2023-02-03 DIAGNOSIS — M546 Pain in thoracic spine: Secondary | ICD-10-CM

## 2023-02-03 DIAGNOSIS — M542 Cervicalgia: Secondary | ICD-10-CM

## 2023-02-03 DIAGNOSIS — Z803 Family history of malignant neoplasm of breast: Secondary | ICD-10-CM

## 2023-02-03 DIAGNOSIS — G8929 Other chronic pain: Secondary | ICD-10-CM

## 2023-02-03 NOTE — Progress Notes (Signed)
Patient ID: Melinda Padilla, female    DOB: 1990-08-18, 32 y.o.   MRN: [id number]   Chief Complaint  Patient presents with   Breast Problem    Mammary Hyperplasia: The patient is a 32 y.o. female with a history of mammary hyperplasia for several years.  She has extremely large breasts causing symptoms that include the following: Back pain in the upper and lower back, including neck pain. She pulls or pins her bra straps to provide better lift and relief of the pressure and pain. She notices relief by holding her breast up manually.  Her shoulder straps cause grooves and pain and pressure that requires padding for relief. Pain medication is sometimes required with motrin and tylenol.  Activities that are hindered by enlarged breasts include: exercise and running.  She has tried supportive clothing as well as fitted bras without improvement.  Her breasts are extremely large and fairly symmetric.  She has hyperpigmentation of the inframammary area on both sides.  The sternal to nipple distance on the right is 38 cm and the left is 38 cm.  The IMF distance is 18 cm.  She is 5 feet 9 inches tall and weighs 219 pounds.  The BMI = 31.3 kg/m.  Preoperative bra size = DD/DDD cup.  The estimated excess breast tissue to be removed at the time of surgery = 750 grams on the left and 750 grams on the right.  Mammogram history: none.  Family history of breast cancer:  mom in her 51's.  Tobacco use:  yes.   The patient expresses the desire to pursue surgical intervention.     Review of Systems  Constitutional:  Positive for activity change.  Eyes: Negative.   Respiratory: Negative.    Cardiovascular: Negative.   Gastrointestinal: Negative.   Endocrine: Negative.   Genitourinary: Negative.   Musculoskeletal:  Positive for back pain and neck pain.    Past Medical History:  Diagnosis Date   Heart murmur     Past Surgical History:  Procedure Laterality Date   NO PAST SURGERIES     ORIF ANKLE  FRACTURE Right 11/26/2020   Procedure: OPEN REDUCTION INTERNAL FIXATION (ORIF) RIGHT LATERAL MALLEOLUS;  Surgeon: Tarry Kos, MD;  Location: [facility];  Service: Orthopedics;  Laterality: Right;      Current Outpatient Medications:    aspirin EC 81 MG tablet, Take 1 tablet (81 mg total) by mouth daily. Swallow whole., Disp: 30 tablet, Rfl: 12   clobetasol ointment (TEMOVATE) 0.05 %, Apply 1 Application topically 2 (two) times daily., Disp: 60 g, Rfl: 3   Objective:   Vitals:   02/03/23 1451  BP: 118/83  Pulse: 70  SpO2: 98%    Physical Exam Vitals and nursing note reviewed.  Constitutional:      Appearance: Normal appearance.  HENT:     Head: Atraumatic.  Cardiovascular:     Rate and Rhythm: Normal rate.     Pulses: Normal pulses.  Pulmonary:     Effort: Pulmonary effort is normal.  Abdominal:     Palpations: Abdomen is soft.  Musculoskeletal:        General: No swelling, tenderness or deformity.  Skin:    General: Skin is warm.     Capillary Refill: Capillary refill takes less than 2 seconds.  Neurological:     Mental Status: She is alert and oriented to person, place, and time.  Psychiatric:        Mood and Affect:  Mood normal.        Behavior: Behavior normal.        Thought Content: Thought content normal.        Judgment: Judgment normal.     Assessment & Plan:  Smoking addiction - Plan: MM Digital Screening, Ambulatory Referral For Surgery Scheduling  Tobacco abuse - Plan: MM Digital Screening, Ambulatory Referral For Surgery Scheduling  Symptomatic mammary hypertrophy - Plan: MM Digital Screening, Ambulatory Referral For Surgery Scheduling  Chronic bilateral thoracic back pain - Plan: MM Digital Screening, Ambulatory Referral For Surgery Scheduling  The procedure the patient selected and that was best for the patient was discussed. The risk were discussed and include but not limited to the following:  Breast asymmetry, fluid accumulation,  firmness of the breast, inability to breast feed, loss of nipple or areola, skin loss, change in skin and nipple sensation, fat necrosis of the breast tissue, bleeding, infection and healing delay.  There are risks of anesthesia and injury to nerves or blood vessels.  Allergic reaction to tape, suture and skin glue are possible.  There will be swelling.  Any of these can lead to the need for revisional surgery which is not included in this surgery.  A breast reduction has potential to interfere with diagnostic procedures in the future.  This procedure is best done when the breast is fully developed.  Changes in the breast will continue to occur over time: pregnancy, weight gain or weigh loss. No guarantees are given for a certain bra or breast size.    Total time: 40 minutes. This includes time spent with the patient during the visit as well as time spent before and after the visit reviewing the chart, documenting the encounter, ordering pertinent studies and literature for the patient.   Physical therapy:  not required Mammogram:  recommend  The patient is a candidate for bilateral breast reduction.  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  She knows she has to stop smoking basically today in order to meet that goal.  She is going to try to do that and we will do a televisit in about a month.  But the plan will be for bilateral breast reduction with liposuction she also needs a mammogram prior to surgery due to her mom's early history of breast cancer.  Pictures were obtained of the patient and placed in the chart with the patient's or guardian's permission.   Alena Bills Krystena Reitter, DO

## 2023-02-10 ENCOUNTER — Institutional Professional Consult (permissible substitution): Payer: Medicaid Other | Admitting: Plastic Surgery

## 2023-02-20 ENCOUNTER — Encounter: Payer: Self-pay | Admitting: Family Medicine

## 2023-02-21 ENCOUNTER — Encounter (HOSPITAL_COMMUNITY): Payer: Self-pay

## 2023-02-21 ENCOUNTER — Ambulatory Visit (HOSPITAL_COMMUNITY)
Admission: RE | Admit: 2023-02-21 | Discharge: 2023-02-21 | Disposition: A | Discharge status: Home / Self Care | Payer: Medicaid Other | Source: Ambulatory Visit | Attending: Family Medicine | Admitting: Family Medicine

## 2023-02-21 ENCOUNTER — Other Ambulatory Visit: Payer: Self-pay

## 2023-02-21 ENCOUNTER — Ambulatory Visit (INDEPENDENT_AMBULATORY_CARE_PROVIDER_SITE_OTHER): Payer: Medicaid Other

## 2023-02-21 VITALS — BP 147/85 | HR 81 | Temp 98.0°F | Resp 18

## 2023-02-21 DIAGNOSIS — R058 Other specified cough: Secondary | ICD-10-CM

## 2023-02-21 MED ORDER — HYDROCODONE BIT-HOMATROP MBR 5-1.5 MG/5ML PO SOLN: 5.0000 mL | Freq: Four times a day (QID) | ORAL | 0 refills | Status: DC | PRN

## 2023-02-21 NOTE — ED Provider Notes (Signed)
[facility]   [id number] 02/21/23 Arrival Time: 1324  ASSESSMENT & PLAN:  1. Post-viral cough syndrome    I have personally viewed and independently interpreted the imaging studies ordered this visit. CXR: no acute changes; no infiltrates.  New Prescriptions   HYDROCODONE BIT-HOMATROPINE (HYCODAN) 5-1.5 MG/5ML SYRUP    Take 5 mLs by mouth every 6 (six) hours as needed for cough.     Follow-up Information     [name], NP.   Specialty: Family Medicine Why: If worsening or failing to improve as anticipated. Contact information: [address] [place] [address] [phone]                 Reviewed expectations re: course of current medical issues. Questions answered. Outlined signs and symptoms indicating need for more acute intervention. Understanding verbalized. After Visit Summary given.   SUBJECTIVE: History from: Patient. Melinda Padilla is a 32 y.o. female. Reports a cough for a week. Complains of loss of appetite, greenish /brown phlegm. Cough affecting sleep. Denies SOB. Smokes occas THC. Complains of general aches and pains.  Has a congested cough Denies: fever. Normal PO intake without n/v/d.  OBJECTIVE:  Vitals:   02/21/23 1416  BP: (!) 147/85  Pulse: 81  Resp: 18  Temp: 98 F (36.7 C)  TempSrc: Oral  SpO2: 96%    General appearance: alert; no distress Eyes: PERRLA; EOMI; conjunctiva normal HENT: [place]; AT; without nasal congestion Neck: supple  Lungs: speaks full sentences without difficulty; unlabored; CTAB; does have a wet cough Extremities: no edema Skin: warm and dry   Imaging: DG Chest 2 View  Result Date: 02/21/2023 CLINICAL DATA:  productive cough x 1 week; night sweats EXAM: CHEST - 2 VIEW COMPARISON:  07/08/2018 FINDINGS: The heart size and mediastinal contours are within normal limits. Both lungs are clear. The visualized skeletal structures are unremarkable. IMPRESSION: No active cardiopulmonary  disease. Electronically Signed   By: Judie Petit.  Shick M.D.   On: 02/21/2023 14:31    Allergies  Allergen Reactions   Latex Hives   Lexapro [Escitalopram] Diarrhea and Nausea Only    Past Medical History:  Diagnosis Date   Heart murmur    Social History   Socioeconomic History   Marital status: Single    Spouse name: Not on file   Number of children: Not on file   Years of education: Not on file   Highest education level: GED or equivalent  Occupational History   Not on file  Tobacco Use   Smoking status: Former    Current packs/day: 0.50    Types: Cigarettes    Passive exposure: Current   Smokeless tobacco: Never  Vaping Use   Vaping status: Former  Substance and Sexual Activity   Alcohol use: No   Drug use: Yes    Types: Marijuana   Sexual activity: Never    Birth control/protection: None  Other Topics Concern   Not on file  Social History Narrative   Discussed smoking cessation with the patient   Social Determinants of Health   Financial Resource Strain: Medium Risk (10/14/2022)   Overall Financial Resource Strain (CARDIA)    Difficulty of Paying Living Expenses: Somewhat hard  Food Insecurity: Food Insecurity Present (10/14/2022)   Hunger Vital Sign    Worried About Running Out of Food in the Last Year: Sometimes true    Ran Out of Food in the Last Year: Sometimes true  Transportation Needs: No Transportation Needs (10/14/2022)   PRAPARE -  Administrator, Civil Service (Medical): No    Lack of Transportation (Non-Medical): No  Physical Activity: Sufficiently Active (10/14/2022)   Exercise Vital Sign    Days of Exercise per Week: 3 days    Minutes of Exercise per Session: 60 min  Stress: Stress Concern Present (10/14/2022)   [employer] of Occupational Health - Occupational Stress Questionnaire    Feeling of Stress : To some extent  Social Connections: Unknown (10/14/2022)   Social Connection and Isolation Panel [NHANES]    Frequency of  Communication with Friends and Family: More than three times a week    Frequency of Social Gatherings with Friends and Family: Once a week    Attends Religious Services: 1 to 4 times per year    Active Member of [employer] or Organizations: No    Attends Engineer, structural: Not on file    Marital Status: Not on file  Intimate Partner Violence: Not on file   Family History  Problem Relation Age of Onset   Diabetes Mother    Cancer Mother    Healthy Father    Heart failure Paternal Grandmother    Kidney failure Paternal Grandmother    Cancer Paternal Grandfather    Past Surgical History:  Procedure Laterality Date   NO PAST SURGERIES     ORIF ANKLE FRACTURE Right 11/26/2020   Procedure: OPEN REDUCTION INTERNAL FIXATION (ORIF) RIGHT LATERAL MALLEOLUS;  Surgeon: Tarry Kos, MD;  Location: [facility];  Service: Orthopedics;  Laterality: Right;     Mardella Layman, MD 02/21/23 1512

## 2023-02-21 NOTE — Discharge Instructions (Signed)
Be aware, your cough medication may cause drowsiness. Please do not drive, operate heavy machinery or make important decisions while on this medication, it can cloud your judgement.  

## 2023-02-21 NOTE — ED Triage Notes (Addendum)
Patient reports a cough for a week.  Complains of loss of appetite, greenish /brown  phlegm.  Episodes of sweating and chills, night sweats, and congested feeling  Complains of general aches and pains.  Has a congested cough  Patient has tried sudafed, mucinex, throat numbing spray.

## 2023-02-26 ENCOUNTER — Other Ambulatory Visit: Payer: Self-pay | Admitting: Family Medicine

## 2023-02-26 DIAGNOSIS — F419 Anxiety disorder, unspecified: Secondary | ICD-10-CM

## 2023-03-03 ENCOUNTER — Ambulatory Visit: Payer: Medicaid Other | Admitting: Plastic Surgery

## 2023-03-03 ENCOUNTER — Encounter: Payer: Self-pay | Admitting: Plastic Surgery

## 2023-03-03 DIAGNOSIS — N62 Hypertrophy of breast: Secondary | ICD-10-CM | POA: Diagnosis not present

## 2023-03-03 NOTE — Progress Notes (Signed)
     Patient ID: Melinda Padilla, female    DOB: 26-Apr-1990, 32 y.o.   MRN: [id number]   Chief Complaint  Patient presents with   Breast Problem    The patient is a 32 year old female joining me by phone for further discussion about her breasts.  She complains of extremely large breasts with back and neck pain.  She has tried different techniques to try and relieve the pain.  Clothing adjustment, Motrin and Tylenol are only temporary and short-lived improvements.  Patient states she finds it difficult to do any strenuous activity because of the pain and her enlarged breasts.  She has some hyperpigmentation at the inframammary folds on both breasts which is seen in her previous exam pictures.  The sternal to nipple distance on the right was 38 cm and on the left 38 cm with an inframammary fold distance to the nipple of 18 cm.  She is 5 feet 9 inches tall and weighs 219 pounds with a BMI of 31.3 kg/m.  Her estimated amount of tissue to be removed is 750 g from both breasts.  She has a history of hypertrophic scar formation.  She has tattoos without difficulty.  The patient was smoking at her last visit in October.  She today says that she believes she stopped smoking shortly after that visit which would be about a month ago.  She has not gotten the mammogram yet but the order is in the computer from October.     Review of Systems  Constitutional: Negative.   HENT: Negative.    Eyes: Negative.   Respiratory: Negative.  Negative for chest tightness.   Cardiovascular: Negative.   Gastrointestinal: Negative.   Endocrine: Negative.   Genitourinary: Negative.   Musculoskeletal:  Positive for back pain and neck pain.  Skin:  Positive for rash.    Past Medical History:  Diagnosis Date   Heart murmur     Past Surgical History:  Procedure Laterality Date   NO PAST SURGERIES     ORIF ANKLE FRACTURE Right 11/26/2020   Procedure: OPEN REDUCTION INTERNAL FIXATION (ORIF) RIGHT LATERAL MALLEOLUS;   Surgeon: Tarry Kos, MD;  Location: [facility];  Service: Orthopedics;  Laterality: Right;      Current Outpatient Medications:    aspirin EC 81 MG tablet, Take 1 tablet (81 mg total) by mouth daily. Swallow whole., Disp: 30 tablet, Rfl: 12   clobetasol ointment (TEMOVATE) 0.05 %, Apply 1 Application topically 2 (two) times daily., Disp: 60 g, Rfl: 3   HYDROcodone bit-homatropine (HYCODAN) 5-1.5 MG/5ML syrup, Take 5 mLs by mouth every 6 (six) hours as needed for cough., Disp: 90 mL, Rfl: 0   Objective:   There were no vitals filed for this visit.  Physical Exam  Assessment & Plan:  Symptomatic mammary hypertrophy  Patient to continue with no smoking, obtain mammogram and have a follow-up visit with the PA in the next month or so.  Still planning on bilateral breast reduction with possible liposuction.  I connected with  Melinda Padilla on 03/03/23 by phone and verified that I am speaking with the correct person using two identifiers.  Patient was at home and I was at the office.  We spent 5 minutes in discussion.   I discussed the limitations of evaluation and management by telemedicine. The patient expressed understanding and agreed to proceed.   Alena Bills Amaiah Cristiano, DO

## 2023-03-11 ENCOUNTER — Ambulatory Visit (INDEPENDENT_AMBULATORY_CARE_PROVIDER_SITE_OTHER): Payer: Medicaid Other | Admitting: Family Medicine

## 2023-03-11 VITALS — BP 120/70 | HR 77 | Temp 97.7°F | Ht 70.0 in | Wt 210.0 lb

## 2023-03-11 DIAGNOSIS — B379 Candidiasis, unspecified: Secondary | ICD-10-CM

## 2023-03-11 DIAGNOSIS — Z113 Encounter for screening for infections with a predominantly sexual mode of transmission: Secondary | ICD-10-CM

## 2023-03-11 DIAGNOSIS — F330 Major depressive disorder, recurrent, mild: Secondary | ICD-10-CM | POA: Diagnosis not present

## 2023-03-11 DIAGNOSIS — Z2821 Immunization not carried out because of patient refusal: Secondary | ICD-10-CM

## 2023-03-11 DIAGNOSIS — Z6830 Body mass index (BMI) 30.0-30.9, adult: Secondary | ICD-10-CM

## 2023-03-11 DIAGNOSIS — E66811 Obesity, class 1: Secondary | ICD-10-CM | POA: Diagnosis not present

## 2023-03-11 DIAGNOSIS — E6609 Other obesity due to excess calories: Secondary | ICD-10-CM

## 2023-03-11 DIAGNOSIS — F419 Anxiety disorder, unspecified: Secondary | ICD-10-CM | POA: Diagnosis not present

## 2023-03-11 MED ORDER — SERTRALINE HCL 50 MG PO TABS
50.0000 mg | ORAL_TABLET | Freq: Every day | ORAL | 3 refills | Status: DC
Start: 2023-03-11 — End: 2023-04-06

## 2023-03-11 NOTE — Progress Notes (Signed)
I,Jameka J Llittleton, CMA,acting as a Neurosurgeon for [employer], NP.,have documented all relevant documentation on the behalf of [name], NP,as directed by  [name], NP while in the presence of [name], NP.  Subjective:  Patient ID: Melinda Padilla , female    DOB: 17-May-1990 , 32 y.o.   MRN: [id number]  Chief Complaint  Patient presents with   Exposure to STD   Anxiety    HPI  Patient is a 32 year old female who presents today for STD screening, she is concerned about STD exposure. Patient also wants to try medication for anxiety/depression  while she awaits her appointment for a psychotherapist.      Past Medical History:  Diagnosis Date   Heart murmur      Family History  Problem Relation Age of Onset   Diabetes Mother    Cancer Mother    Healthy Father    Heart failure Paternal Grandmother    Kidney failure Paternal Grandmother    Cancer Paternal Grandfather      Current Outpatient Medications:    sertraline (ZOLOFT) 50 MG tablet, Take 1 tablet (50 mg total) by mouth daily., Disp: 30 tablet, Rfl: 3   aspirin EC 81 MG tablet, Take 1 tablet (81 mg total) by mouth daily. Swallow whole., Disp: 30 tablet, Rfl: 12   clobetasol ointment (TEMOVATE) 0.05 %, Apply 1 Application topically 2 (two) times daily., Disp: 60 g, Rfl: 3   HYDROcodone bit-homatropine (HYCODAN) 5-1.5 MG/5ML syrup, Take 5 mLs by mouth every 6 (six) hours as needed for cough., Disp: 90 mL, Rfl: 0   Allergies  Allergen Reactions   Latex Hives     Review of Systems  Constitutional: Negative.   HENT: Negative.    Eyes: Negative.   Respiratory: Negative.    Cardiovascular: Negative.   Gastrointestinal: Negative.   Endocrine: Negative.   Genitourinary: Negative.   Musculoskeletal: Negative.   Skin: Negative.   Allergic/Immunologic: Negative.   Neurological: Negative.   Hematological: Negative.   Psychiatric/Behavioral:  The patient is nervous/anxious.      Today's Vitals   03/11/23 1544   BP: 120/70  Pulse: 77  Temp: 97.7 F (36.5 C)  Weight: 210 lb (95.3 kg)  Height: 5\' 10"  (1.778 m)  PainSc: 0-No pain   Body mass index is 30.13 kg/m.  Wt Readings from Last 3 Encounters:  03/11/23 210 lb (95.3 kg)  02/03/23 219 lb (99.3 kg)  01/14/23 222 lb (100.7 kg)    The ASCVD Risk score (Arnett DK, et al., 2019) failed to calculate for the following reasons:   The 2019 ASCVD risk score is only valid for ages 67 to 74  Objective:  Physical Exam Constitutional:      Appearance: Normal appearance.  Cardiovascular:     Rate and Rhythm: Normal rate and regular rhythm.     Pulses: Normal pulses.     Heart sounds: Normal heart sounds.  Pulmonary:     Effort: Pulmonary effort is normal.     Breath sounds: Normal breath sounds.  Abdominal:     General: Bowel sounds are normal.  Neurological:     Mental Status: She is alert.         Assessment And Plan:  Anxiety Assessment & Plan: Start Setraline 50mg  QD   Mild episode of recurrent major depressive disorder [facility]) Assessment & Plan: Referred for psychotherapy.  Orders: -     Sertraline HCl; Take 1 tablet (50 mg total) by mouth daily.  Dispense: 30 tablet; Refill: 3  Screening examination for STD (sexually transmitted disease) -     NuSwab Vaginitis Plus (VG+) -     HSV 1 and 2 Ab, IgG -     RPR -     HIV Antibody (routine testing w rflx)  COVID-19 vaccination declined  Class 1 obesity due to excess calories with body mass index (BMI) of 30.0 to 30.9 in adult, unspecified whether serious comorbidity present    Return in 1 month (on 04/10/2023), or if symptoms worsen or fail to improve, for Anxiety.  Patient was given opportunity to ask questions. Patient verbalized understanding of the plan and was able to repeat key elements of the plan. All questions were answered to their satisfaction.    I, [name], NP, have reviewed all documentation for this visit. The documentation on 03/17/23 for the exam,  diagnosis, procedures, and orders are all accurate and complete.   IF YOU HAVE BEEN REFERRED TO A SPECIALIST, IT MAY TAKE 1-2 WEEKS TO SCHEDULE/PROCESS THE REFERRAL. IF YOU HAVE NOT HEARD FROM US/SPECIALIST IN TWO WEEKS, PLEASE GIVE [place] A CALL AT [phone].

## 2023-03-12 LAB — HIV ANTIBODY (ROUTINE TESTING W REFLEX): HIV Screen 4th Generation wRfx: NONREACTIVE

## 2023-03-12 LAB — RPR: RPR Ser Ql: NONREACTIVE

## 2023-03-12 LAB — HSV 1 AND 2 AB, IGG
HSV 1 Glycoprotein G Ab, IgG: NONREACTIVE
HSV 2 IgG, Type Spec: REACTIVE — AB

## 2023-03-13 LAB — NUSWAB VAGINITIS PLUS (VG+)
Candida albicans, NAA: NEGATIVE
Candida glabrata, NAA: POSITIVE — AB
Chlamydia trachomatis, NAA: NEGATIVE
Neisseria gonorrhoeae, NAA: NEGATIVE
Trich vag by NAA: NEGATIVE

## 2023-03-17 DIAGNOSIS — E66811 Obesity, class 1: Secondary | ICD-10-CM | POA: Insufficient documentation

## 2023-03-17 DIAGNOSIS — B379 Candidiasis, unspecified: Secondary | ICD-10-CM | POA: Insufficient documentation

## 2023-03-17 DIAGNOSIS — Z113 Encounter for screening for infections with a predominantly sexual mode of transmission: Secondary | ICD-10-CM | POA: Insufficient documentation

## 2023-03-17 MED ORDER — FLUCONAZOLE 150 MG PO TABS: 150.0000 mg | ORAL_TABLET | Freq: Every day | ORAL | 0 refills | Status: DC

## 2023-03-17 NOTE — Assessment & Plan Note (Signed)
Referred for psychotherapy

## 2023-03-17 NOTE — Progress Notes (Signed)
Test was positive for Candida.Fluconazole 150mg  1 tablet by mouth sent to the pharmacy

## 2023-03-17 NOTE — Assessment & Plan Note (Signed)
Start Setraline 50mg  QD

## 2023-03-17 NOTE — Progress Notes (Signed)
Tests is Positive for candida . Fluconazole 150mg  1 tablet by mouth sent to the pharmacy.

## 2023-03-17 NOTE — Assessment & Plan Note (Signed)
She is encouraged to strive for BMI less than 30 to decrease cardiac risk. Advised to aim for at least 150 minutes of exercise per week.

## 2023-03-24 ENCOUNTER — Ambulatory Visit: Payer: Medicaid Other | Admitting: Physician Assistant

## 2023-03-24 NOTE — Progress Notes (Unsigned)
   Referring Provider [name], NP [address] [place],  [place] [address]   CC: No chief complaint on file.     Melinda Padilla is an 32 y.o. female.  HPI: Patient is a 32 y.o. 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  She was seen for initial consult by Dr. Ulice Bold.  At that time, patient complained of chronic upper back and neck discomfort in the context of large breasts.  Patient weighed 219 pounds, BMI of 31.3 kg meter squared.  Estimated amount of tissue removed at time of surgery equals 750 g each side.  STN 38 cm each side.  Patient was approximately 4 weeks out from last nicotine use.  Per Dr. Kittie Plater note, plan for screening mammogram given family history of breast cancer and continued nicotine cessation, then can proceed with scheduling bilateral breast reduction possible liposuction.  Review of Systems General: Denies fevers MSK: Endorses ongoing back and neck discomfort Skin: Denies rashes ***  Physical Exam    03/11/2023    3:44 PM 02/21/2023    2:16 PM 02/03/2023    2:51 PM  Vitals with BMI  Height 5\' 10"   5\' 10"   Weight 210 lbs  219 lbs  BMI 30.13  31.42  Systolic 120 147 621  Diastolic 70 85 83  Pulse 77 81 70    General:  No acute distress,  Alert and oriented, Non-Toxic, Normal speech and affect Psych: Normal behavior and mood Respiratory: No increased WOB MSK: Ambulatory  Assessment/Plan ***  Patient is interested in pursuing surgical intervention for bilateral breast reduction. Patient has completed at least 6 weeks of physical therapy for pain related to macromastia.  Discussed with patient we would submit to insurance for authorization, discussed approval could take up to 6 weeks.   Melinda Padilla 03/24/2023, 8:22 AM

## 2023-03-25 ENCOUNTER — Ambulatory Visit: Payer: Medicaid Other | Admitting: Adult Health

## 2023-04-01 ENCOUNTER — Encounter: Payer: Self-pay | Admitting: Family Medicine

## 2023-04-01 ENCOUNTER — Other Ambulatory Visit: Payer: Self-pay

## 2023-04-01 DIAGNOSIS — F330 Major depressive disorder, recurrent, mild: Secondary | ICD-10-CM

## 2023-04-01 DIAGNOSIS — F419 Anxiety disorder, unspecified: Secondary | ICD-10-CM

## 2023-04-02 ENCOUNTER — Encounter: Payer: Self-pay | Admitting: Family Medicine

## 2023-04-02 ENCOUNTER — Telehealth: Payer: Self-pay | Admitting: Adult Health

## 2023-04-02 ENCOUNTER — Ambulatory Visit: Payer: Medicaid Other | Admitting: Family Medicine

## 2023-04-02 VITALS — BP 102/70 | HR 80 | Temp 98.2°F | Ht 70.0 in | Wt 207.0 lb

## 2023-04-02 DIAGNOSIS — R112 Nausea with vomiting, unspecified: Secondary | ICD-10-CM

## 2023-04-02 DIAGNOSIS — K219 Gastro-esophageal reflux disease without esophagitis: Secondary | ICD-10-CM | POA: Diagnosis not present

## 2023-04-02 MED ORDER — PANTOPRAZOLE SODIUM 40 MG PO TBEC
40.0000 mg | DELAYED_RELEASE_TABLET | Freq: Every day | ORAL | 1 refills | Status: AC
Start: 2023-04-02 — End: 2023-06-01

## 2023-04-02 MED ORDER — METOCLOPRAMIDE HCL 10 MG PO TABS: 10.0000 mg | ORAL_TABLET | Freq: Three times a day (TID) | ORAL | 0 refills | Status: DC | PRN

## 2023-04-02 NOTE — Telephone Encounter (Signed)
Pt called to confirm details of upcoming appointment
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
## 2023-04-02 NOTE — Progress Notes (Signed)
Melinda Padilla, CMA,acting as a Neurosurgeon for Melinda Lynch, NP.,have documented all relevant documentation on the behalf of Melinda Hose, NP,as directed by  Melinda Hose, NP while in the presence of Melinda Hose, NP.  Subjective:  Patient ID: Melinda Padilla , female    DOB: [birth year] , 32 y.o.   MRN: [id number]  Chief Complaint  Patient presents with   Abdominal Pain    HPI  Patient is a 32 year old female who presents today for abdominal pain. She states that  her stomach feels like its burning and she reports she was nauseous and vomiting. Patient states that her symptoms started twenty four hours after starting Sertraline for anxiety.The night after taking sertraline, she started feeling sick and she has been sick ever since. Patient states that the severity has reduced though because today she vomited just once unlike Wednesday when she vomited four times.  Patient advised to discontinue Sertraline and be on a BRAT(Banana,rice,applesauce,toast) diet for next 24 -48 hours and gradually advance. If symptoms worsen to call to office or go to the ER, patient voiced understanding.     Past Medical History:  Diagnosis Date   Heart murmur      Family History  Problem Relation Age of Onset   Diabetes Mother    Cancer Mother    Healthy Father    Stroke Maternal Grandmother    Stroke Maternal Grandfather    Heart failure Paternal Grandmother    Kidney failure Paternal Grandmother    Stroke Paternal Grandmother    Cancer Paternal Grandfather      Current Outpatient Medications:    aspirin EC 81 MG tablet, Take 1 tablet (81 mg total) by mouth daily. Swallow whole., Disp: 30 tablet, Rfl: 12   clobetasol ointment (TEMOVATE) 0.05 %, Apply 1 Application topically 2 (two) times daily., Disp: 60 g, Rfl: 3   metoCLOPramide (REGLAN) 10 MG tablet, Take 1 tablet (10 mg total) by mouth every 8 (eight) hours as needed for up to 10 days for nausea., Disp: 30 tablet, Rfl: 0   pantoprazole  (PROTONIX) 40 MG tablet, Take 1 tablet (40 mg total) by mouth daily., Disp: 30 tablet, Rfl: 1   amitriptyline (ELAVIL) 25 MG tablet, Take 1 tablet (25 mg total) by mouth at bedtime., Disp: 30 tablet, Rfl: 3   Allergies  Allergen Reactions   Latex Hives     Review of Systems  Constitutional: Negative.   Respiratory: Negative.    Gastrointestinal:  Positive for abdominal pain, nausea and vomiting.  Endocrine: Negative.   Neurological: Negative.   Psychiatric/Behavioral:  Negative for suicidal ideas. The patient is nervous/anxious.      Today's Vitals   04/02/23 1409  BP: 102/70  Pulse: 80  Temp: 98.2 F (36.8 C)  TempSrc: Oral  Weight: 207 lb (93.9 kg)  Height: 5\' 10"  (1.778 m)  PainSc: 0-No pain   Body mass index is 29.7 kg/m.  Wt Readings from Last 3 Encounters:  04/06/23 218 lb (98.9 kg)  04/02/23 207 lb (93.9 kg)  03/11/23 210 lb (95.3 kg)    The ASCVD Risk score (Arnett DK, et al., 2019) failed to calculate for the following reasons:   The 2019 ASCVD risk score is only valid for ages 85 to 32  Objective:  Physical Exam HENT:     Head: Normocephalic.  Pulmonary:     Effort: Pulmonary effort is normal.     Breath sounds: Normal breath sounds.  Abdominal:     Tenderness:  There is abdominal tenderness.  Skin:    General: Skin is warm and dry.  Neurological:     Mental Status: She is alert.         Assessment And Plan:  Nausea and vomiting in adult -     Metoclopramide HCl; Take 1 tablet (10 mg total) by mouth every 8 (eight) hours as needed for up to 10 days for nausea.  Dispense: 30 tablet; Refill: 0  Gastroesophageal reflux disease without esophagitis -     Pantoprazole Sodium; Take 1 tablet (40 mg total) by mouth daily.  Dispense: 30 tablet; Refill: 1    Return if symptoms worsen or fail to improve, for Please schedule physical  after 08/21/2023. Thanks!.  Patient was given opportunity to ask questions. Patient verbalized understanding of the plan and  was able to repeat key elements of the plan. All questions were answered to their satisfaction.    I, Melinda Hose, NP, have reviewed all documentation for this visit. The documentation on 04/08/23 for the exam, diagnosis, procedures, and orders are all accurate and complete.   IF YOU HAVE BEEN REFERRED TO A SPECIALIST, IT MAY TAKE 1-2 WEEKS TO SCHEDULE/PROCESS THE REFERRAL. IF YOU HAVE NOT HEARD FROM US/SPECIALIST IN TWO WEEKS, PLEASE GIVE [place] A CALL AT [phone].

## 2023-04-06 ENCOUNTER — Ambulatory Visit (INDEPENDENT_AMBULATORY_CARE_PROVIDER_SITE_OTHER): Payer: Medicaid Other | Admitting: Adult Health

## 2023-04-06 ENCOUNTER — Encounter: Payer: Self-pay | Admitting: Adult Health

## 2023-04-06 VITALS — BP 111/76 | HR 69 | Ht 70.0 in | Wt 218.0 lb

## 2023-04-06 DIAGNOSIS — R2689 Other abnormalities of gait and mobility: Secondary | ICD-10-CM

## 2023-04-06 DIAGNOSIS — M542 Cervicalgia: Secondary | ICD-10-CM | POA: Diagnosis not present

## 2023-04-06 DIAGNOSIS — I7774 Dissection of vertebral artery: Secondary | ICD-10-CM | POA: Diagnosis not present

## 2023-04-06 MED ORDER — AMITRIPTYLINE HCL 25 MG PO TABS: 25.0000 mg | ORAL_TABLET | Freq: Every day | ORAL | 3 refills | Status: DC

## 2023-04-06 NOTE — Patient Instructions (Addendum)
Your Plan:  Continue aspirin daily   Recommend starting amitriptyline 25mg  nightly for headaches and neck pain - please let me know if no benefit after 1-2 weeks or sooner if difficulty tolerating   Referral placed to PT for imbalance and neck pain  Follow up with Dr. Dione Booze next month for repeat vision exam  Contact psychiatry at [phone] if you do not hear from them by mid week to schedule initial visit      Follow up in 6 months or call earlier if needed     Thank you for coming to see [place] at [facility]. I hope we have been able to provide you high quality care today.  You may receive a patient satisfaction survey over the next few weeks. We would appreciate your feedback and comments so that we may continue to improve ourselves and the health of our patients.   Concussion, Adult  A concussion is a brain injury from a hard, direct hit (trauma) to the head or body. This direct hit causes the brain to shake quickly back and forth inside the skull. This can damage brain cells and cause chemical changes in the brain. A concussion may also be known as a mild traumatic brain injury (TBI). The effects of a concussion can be serious. If you have a concussion, you should be very careful to avoid having a second concussion. What are the causes? This condition is caused by: A direct hit to your head. Sudden movement of your body that causes your brain to move back and forth inside the skull, such as in a car crash. What are the signs or symptoms? The signs of a concussion can be hard to notice. Early on, they may be missed by you, family members, and health care providers. You may look fine on the outside but may act or feel differently. Every head injury is different. Symptoms are usually temporary but may last for days, weeks, or even months. Some symptoms appear right away, but other symptoms may not show up for hours or days. Physical  symptoms Headaches. Dizziness and problems with coordination or balance. Sensitivity to light or noise. Nausea or vomiting. Tiredness (fatigue). Vision or hearing problems. Seizure. Mental and emotional symptoms Irritability or mood changes. Memory problems. Trouble concentrating, organizing, or making decisions. Changes in eating or sleeping patterns. Slowness in thinking, acting or reacting, speaking, or reading. Anxiety or depression. How is this diagnosed? This condition is diagnosed based on your symptoms and injury. You may also have tests, including: Imaging tests, such as a CT scan or an MRI. Neuropsychological tests. These measure your thinking, understanding, learning, and memory. How is this treated? Treatment for this condition includes: Stopping sports or activity if you are injured. Physical and mental rest and careful observation, usually at home. Medicines to help with symptoms such as headaches, nausea, or difficulty sleeping. Referral to a concussion clinic or rehab center. Follow these instructions at home: Activity Limit activities that require a lot of thought or concentration, such as: Doing homework or job-related work. Watching TV. Using the computer or phone. Playing memory games and doing puzzles. Rest helps your brain heal. Make sure you: Get plenty of sleep. Most adults should get 7-9 hours of sleep each night. Rest during the day. Take naps or rest breaks when you feel tired. Avoid high-intensity exercise or physical activities that take a lot of effort. Stop any activity that worsens symptoms. Your health care provider may recommend light exercise such  as walking. Do not do high-risk activities that could cause a second concussion, such as riding a bike or playing sports. Ask your health care provider when you can return to your normal activities, such as school, work, sports, and driving. Your ability to react may be slower after a brain injury.  Never do these activities if you are dizzy. General instructions  Take over-the-counter and prescription medicines only as told by your health care provider. Some medicines, such as blood thinners (anticoagulants) and aspirin, may increase the risk for complications, such as bleeding. Avoid taking opioid pain medicine while recovering from a concussion. Do not drink alcohol until your health care provider says you can. Drinking alcohol may slow your recovery and can put you at risk of further injury. Watch your symptoms and tell others around you to do the same. Complications sometimes occur after a concussion. Tell your work Production designer, theatre/television/film, teachers, Tax adviser, school counselor, coach, or sports trainer about your injury, symptoms, and restrictions. See a mental health therapist if you feel anxious or depressed. Managing this condition can be challenging. Keep all follow-up visits. Your health care provider will check on your recovery and give you a plan for returning to activities. How is this prevented? Avoiding another brain injury is very important. In rare cases, another injury can lead to permanent brain damage, brain swelling, or death. The risk of this is greatest during the first 7-10 days after a head injury. Avoid injuries by: Stopping activities that could lead to a second concussion, such as contact or recreational sports, until your health care provider says it is okay. Taking these actions once you have returned to sports or activities: Avoid plays or moves that can cause you to crash into another person. This is how most concussions occur. Follow the rules and be respectful of other players. Do not engage in violent or illegal plays. Getting regular exercise that includes strength and balance training. Wearing a properly fitting helmet during sports, biking, or other activities. Helmets can help protect you from serious skull and brain injuries, but they may not protect you from a  concussion. Even when wearing a helmet, you should avoid being hit in the head. Where to find more information Centers for Disease Control and Prevention: [employer]erProm[employer].no Contact a health care provider if: Your symptoms do not improve or get worse. You have new symptoms. You have another injury. Your coordination gets worse. You have unusual behavior changes. Get help right away if: You have a severe or worsening headache. You have weakness or numbness in any part of your body, slurred speech, vision changes, or confusion. You vomit repeatedly. You lose consciousness, are sleepier than normal, or are difficult to wake up. You have a seizure. These symptoms may be an emergency. Get help right away. Call 911. Do not wait to see if the symptoms will go away. Do not drive yourself to the hospital. Also, get help right away if: You have thoughts of hurting yourself or others. Take one of these steps if you feel like you may hurt yourself or others, or have thoughts about taking your own life: Go to your nearest emergency room. Call 911. Call the National Suicide Prevention Lifeline at 914 533 9240 or 988. This is open 24 hours a day. Text the Crisis Text Line at 380-247-5163. This information is not intended to replace advice given to you by your health care provider. Make sure you discuss any questions you have with your health care provider. Document Revised: 08/30/2021  Document Reviewed: 08/30/2021 Elsevier Patient Education  2024 [employer].

## 2023-04-08 DIAGNOSIS — R112 Nausea with vomiting, unspecified: Secondary | ICD-10-CM | POA: Insufficient documentation

## 2023-04-08 DIAGNOSIS — K219 Gastro-esophageal reflux disease without esophagitis: Secondary | ICD-10-CM | POA: Insufficient documentation

## 2023-04-10 ENCOUNTER — Ambulatory Visit: Payer: Medicaid Other | Admitting: Family Medicine

## 2023-04-23 ENCOUNTER — Ambulatory Visit: Payer: Medicaid Other | Attending: Adult Health | Admitting: Physical Therapy

## 2023-05-04 ENCOUNTER — Ambulatory Visit: Payer: Medicaid Other | Admitting: Physician Assistant

## 2023-05-12 ENCOUNTER — Ambulatory Visit: Payer: Medicaid Other | Admitting: Neurology

## 2023-06-10 ENCOUNTER — Ambulatory Visit: Payer: Medicaid Other | Admitting: Dermatology

## 2023-06-23 ENCOUNTER — Encounter: Payer: Self-pay | Admitting: Family Medicine

## 2023-07-28 NOTE — Progress Notes (Addendum)
   Referring Provider Melodie Spry, NP [address] [place],  [place] [address]   CC:  Chief Complaint  Patient presents with   Follow-up      Melinda Padilla is an 33 y.o. female.  HPI: Patient is a 33 y.o. 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  She was seen for initial consult by Dr. Orin Birk 02/03/2023.  At that time, complained of chronic upper back and neck discomfort in the context of large breasts.  STN 38 cm each side.  BMI equaled 31.42 kg/m.  Estimated amount of tissue removed from each side = 750 g.  History of hypertrophic scarring.  Given that patient's mother had breast cancer at a young age, mammogram was ordered by Dr. Orin Birk.  Plan was for follow-up after mammogram with continued abstinence from nicotine use.  Plan for bilateral breast reduction with possible liposuction.  Today, patient has done well from a weight loss standpoint and has lost 9 pounds since initial in person evaluation.  BMI now equals 30.16 kg/m.  However, she continues to smoke 1 cigarette/day, reportedly improved from where she was at time of initial consult.  Additionally, she states that she never heard about her mammogram imaging despite having that conversation with Dr. Orin Birk.  She continues to endorse chronic upper back and neck discomfort related to her large breasts and like to proceed with breast reduction surgery.  She is confident that she can cut out nicotine entirely and understands that we will not perform surgery otherwise.  Review of Systems General: Denies fevers MSK: Endorses ongoing back and neck discomfort Skin: Denies rashes  Physical Exam    07/29/2023    1:59 PM 04/06/2023    7:34 AM 04/02/2023    2:09 PM  Vitals with BMI  Height 5\' 10"  5\' 10"  5\' 10"   Weight 210 lbs 3 oz 218 lbs 207 lbs  BMI 30.16 31.28 29.7  Systolic 104 111 191  Diastolic 69 76 70  Pulse 82 69 80    General:  No  acute distress,  Alert and oriented, Non-Toxic, Normal speech and affect Psych: Normal behavior and mood Respiratory: No increased WOB MSK: Ambulatory  Assessment/Plan  Symptomatic memory hypertrophy, nicotine use:  Patient is interested in pursuing surgical intervention for bilateral breast reduction.  Will order screening mammogram again, per Dr. Orin Birk.  Patient is confident that she is going to stop nicotine entirely and understands that there will be nicotine/cotinine metabolite testing obtained at her preoperative encounter.  Discussed with patient we would submit to insurance for authorization, discussed approval could take up to 6 weeks.   Addendum: Patient will need to complete PT as well for insurance authorization per our team. Placing ambulatory referral.  Mariel Shope 07/29/2023, 2:28 PM

## 2023-07-29 ENCOUNTER — Encounter: Payer: Self-pay | Admitting: Physician Assistant

## 2023-07-29 ENCOUNTER — Ambulatory Visit (INDEPENDENT_AMBULATORY_CARE_PROVIDER_SITE_OTHER): Admitting: Physician Assistant

## 2023-07-29 VITALS — BP 104/69 | HR 82 | Ht 70.0 in | Wt 210.2 lb

## 2023-07-29 DIAGNOSIS — G8929 Other chronic pain: Secondary | ICD-10-CM

## 2023-07-29 DIAGNOSIS — M546 Pain in thoracic spine: Secondary | ICD-10-CM

## 2023-07-29 DIAGNOSIS — F17210 Nicotine dependence, cigarettes, uncomplicated: Secondary | ICD-10-CM

## 2023-07-29 DIAGNOSIS — N62 Hypertrophy of breast: Secondary | ICD-10-CM

## 2023-07-31 ENCOUNTER — Telehealth: Payer: Self-pay | Admitting: Plastic Surgery

## 2023-07-31 NOTE — Telephone Encounter (Signed)
 Hmm, not sure. I definitely placed an order and it's still seen in the chart from my end. Forwarded it to [facility] who was on top of it and reported that it was all set. Can you look into it again?

## 2023-07-31 NOTE — Telephone Encounter (Signed)
 Patient was looking to find out what pt they need and they also need another referral because I'm not seeing one on our end, please reach out and advise

## 2023-08-03 ENCOUNTER — Ambulatory Visit
Admission: RE | Admit: 2023-08-03 | Discharge: 2023-08-03 | Disposition: A | Discharge status: Home / Self Care | Source: Ambulatory Visit | Attending: Physician Assistant | Admitting: Physician Assistant

## 2023-08-03 NOTE — Addendum Note (Signed)
 Addended by: Mariel Shope on: 08/03/2023 02:22 PM   Modules accepted: Orders

## 2023-08-06 NOTE — Telephone Encounter (Signed)
 I placed the order for mammogram and it just resulted, BI-RADS Category 1: Negative.  Melinda Padilla was referring to PT.  Addended the note with an order for PT in case it was needed, but Melinda Padilla is submitting for insurance authorization without it.  Patient understands to hold off until we hear back re: insurance authorization.

## 2023-08-13 ENCOUNTER — Ambulatory Visit (INDEPENDENT_AMBULATORY_CARE_PROVIDER_SITE_OTHER): Admitting: Licensed Clinical Social Worker

## 2023-08-13 DIAGNOSIS — F329 Major depressive disorder, single episode, unspecified: Secondary | ICD-10-CM | POA: Diagnosis not present
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
## 2023-08-20 ENCOUNTER — Ambulatory Visit: Admitting: Licensed Clinical Social Worker

## 2023-08-20 DIAGNOSIS — F329 Major depressive disorder, single episode, unspecified: Secondary | ICD-10-CM | POA: Diagnosis not present

## 2023-08-20 NOTE — Progress Notes (Signed)
 [facility] Counselor/Therapist Progress Note  Patient ID: Melinda Padilla, MRN: [id number]   Date: 08/20/23  Time Spent: 12:03  pm - 1:10 pm : 67 Minutes  Treatment Type: Individual Therapy.  Reported Symptoms: was feeling   Mental Status Exam: Appearance:  Casual     Behavior: Sharing  Motor: Normal  Speech/Language:  Normal Rate  Affect: Appropriate  Mood: normal  Thought process: normal  Thought content:   WNL  Sensory/Perceptual disturbances:   WNL  Orientation: oriented to person, place, and time/date  Attention: Good  Concentration: Good  Memory: WNL  Fund of knowledge:  Good  Insight:   Good  Judgment:  Good  Impulse Control: Good   Risk Assessment: Danger to Self:  No Self-injurious Behavior: No Danger to Others: No Duty to Warn:no Physical Aggression / Violence:No  Access to Firearms a concern: No  Gang Involvement:No   Subjective:   Pennye Spiker participated from office, via video and consented to treatment. Therapist participated from home office. I discussed the limitations of evaluation and management by telemedicine and the availability of in person appointments. The patient expressed understanding and agreed to proceed. Kynzi reviewed the events of the past week. Provided contact information for [facility]. Sustained a concussion at work last year and has been having extreme headaches and vision changes.       We reviewed numerous treatment approaches including CBT and Solution focused therapy. Psych-education regarding the Reigan's diagnosis of Reactive depression was provided during the session. We discussed Cyerra Chilton's goals treatment goals which include identifying triggers/glimmers, fear/concerns as it pertains to money management, lifestyle changes, putting things in order and finding a sense of peace.  . Lakyia Hilgers provided verbal approval of the treatment plan.   Interventions: Psycho-education & Goal  Setting.   Diagnosis:  Reactive depression  Psychiatric Treatment: No , N/A   Treatment Plan:  Client Abilities/Strengths Cyan is open and receptive to making necessary changes since the loss of her family members.    Support System: Family and Friends  Health and safety inspector  Treatment Level Weekly  Symptoms  Overwhelmed/Burnt out   (Status: maintained) Headaches   (Status: maintained)  Goals:   Jessye agreed to set goal of  identifying triggers/glimmers, fear/concerns as it pertains to money management, lifestyle changes, putting things in order and finding a sense of peace.  Realizes she is not good at making plan and has been operating last minute.  Has been sheltered growing up and is not good at planning/budgeting and taking care of herself.  Has decided she does not want to put more effort into this relationship but wants to focus on herself and plans to move when her lease is up.   Treatment plan signed and available on s-drive:  Yes    Target Date: 10/01/23 Frequency: Weekly  Progress: 0 Modality: individual    Therapist will provide referrals for additional resources as appropriate.  Therapist will provide psycho-education regarding Jakari's diagnosis and corresponding treatment approaches and interventions. Licensed Clinical Mental Health Counselor, Grandville Lax, [facility] will support the patient's ability to achieve the goals identified. will employ CBT, BA, Problem-solving, Solution Focused, Mindfulness,  coping skills, & other evidenced-based practices will be used to promote progress towards healthy functioning to help manage decrease symptoms associated with her diagnosis.   Reduce overall level, frequency, and intensity of the feelings of depression, anxiety and panic evidenced by decreased depression and mood dysregulation.   Verbally express understanding of the relationship between  feelings of depression, anxiety and their impact on  thinking patterns and behaviors. Verbalize an understanding of the role that distorted thinking plays in creating fears, excessive worry, and ruminations.    (Bettylou participated in the creation of the treatment plan)    Grandville Lax, LCMHC

## 2023-08-24 ENCOUNTER — Ambulatory Visit: Payer: Medicaid Other | Admitting: Physician Assistant

## 2023-08-27 ENCOUNTER — Ambulatory Visit: Admitting: Licensed Clinical Social Worker

## 2023-08-27 DIAGNOSIS — F329 Major depressive disorder, single episode, unspecified: Secondary | ICD-10-CM

## 2023-08-27 NOTE — Progress Notes (Signed)
 [facility] Counselor/Therapist Progress Note  Patient ID: Melinda Padilla, MRN: [id number]    Date: 08/27/23  Time Spent: 4:03  pm - 5:03 pm : 60 Minutes  Treatment Type: Individual Therapy.  Reported Symptoms: extreme fatigue, lack of appetite, took some time off from work-not common, no interest-less active   Mental Status Exam: Appearance:  Casual     Behavior: Sharing  Motor: Normal  Speech/Language:  Normal Rate  Affect: Appropriate  Mood: normal  Thought process: normal  Thought content:   WNL  Sensory/Perceptual disturbances:   WNL  Orientation: oriented to person, place, and time/date  Attention: Good  Concentration: Good  Memory: WNL  Fund of knowledge:  Good  Insight:   Good  Judgment:  Good  Impulse Control: Good   Risk Assessment: Danger to Self:  No Self-injurious Behavior: No Danger to Others: No Duty to Warn:no Physical Aggression / Violence:No  Access to Firearms a concern: No  Gang Involvement:No   Subjective:   [name] participated from car, via video, and consented to treatment. I discussed the limitations of evaluation and management by telemedicine and the availability of in person appointments. The patient expressed understanding and agreed to proceed.  Therapist participated from home office.  Yuleimy reviewed the events of the past week.      Interventions: Solution-Oriented/Positive Psychology and Insight-Oriented  Diagnosis:  Reactive depression  Psychiatric Treatment: No , N/A  Treatment Plan:  Client Abilities/Strengths [name] is open and receptive to sessions.    Support System: Family and Friends  Merchant navy officer of Needs [name] would like to focus on self as she has been effected emotionally, lost weight, sleep deprived, not eating, etc.     Treatment Level Weekly  Symptoms  Extreme Fatigue   (Status: declined) Confused   (Status: declined)  Goals:    [name] experiences symptoms of running in circles with partner as it pertains to conflict resolution.  Feeling stuck because she just signed a lease with partner.  Feeling like roommates since she has been putting boundaries and stopped making an effort.     Target Date: 10/01/23 Frequency: Weekly  Progress: 0 Modality: individual    Therapist will provide referrals for additional resources as appropriate.  Therapist will provide psycho-education regarding [name]'s diagnosis and corresponding treatment approaches and interventions. Licensed Clinical Mental Health Counselor, Grandville Lax, [facility] will support the patient's ability to achieve the goals identified. will employ CBT, BA, Problem-solving, Solution Focused, Mindfulness,  coping skills, & other evidenced-based practices will be used to promote progress towards healthy functioning to help manage decrease symptoms associated with her diagnosis.   Reduce overall level, frequency, and intensity of the feelings of depression, anxiety and panic evidenced by decreased depressed symptoms noted above.   Verbally express understanding of the relationship between feelings of depression, anxiety and their impact on thinking patterns and behaviors. Verbalize an understanding of the role that distorted thinking plays in creating fears, excessive worry, and ruminations.  (Safiyah participated in the creation of the treatment plan)   Grandville Lax, LCMHC

## 2023-08-28 ENCOUNTER — Other Ambulatory Visit: Payer: Self-pay

## 2023-08-28 ENCOUNTER — Ambulatory Visit (HOSPITAL_COMMUNITY)
Admission: RE | Admit: 2023-08-28 | Discharge: 2023-08-28 | Disposition: A | Discharge status: Home / Self Care | Source: Ambulatory Visit | Attending: Emergency Medicine | Admitting: Emergency Medicine

## 2023-08-28 ENCOUNTER — Encounter (HOSPITAL_COMMUNITY): Payer: Self-pay

## 2023-08-28 ENCOUNTER — Ambulatory Visit: Payer: Self-pay

## 2023-08-28 VITALS — BP 117/80 | HR 72 | Temp 98.5°F | Resp 18

## 2023-08-28 DIAGNOSIS — R42 Dizziness and giddiness: Secondary | ICD-10-CM

## 2023-08-28 LAB — POCT URINE PREGNANCY: Preg Test, Ur: NEGATIVE

## 2023-08-28 LAB — POCT FASTING CBG KUC MANUAL ENTRY: POCT Glucose (KUC): 96 mg/dL (ref 70–99)

## 2023-08-28 MED ORDER — MECLIZINE HCL 12.5 MG PO TABS: 12.5000 mg | ORAL_TABLET | Freq: Three times a day (TID) | ORAL | 0 refills | Status: DC | PRN

## 2023-08-28 NOTE — Telephone Encounter (Signed)
 Copied from CRM [id number]. Topic: Clinical - Red Word Triage >> Aug 28, 2023 11:10 AM Melinda Padilla T wrote: [facility] that prompted transfer to Nurse Triage: patient says for a week she has been fatigue, losing weight and just not feeling well  Chief Complaint: Generalized fatigue and dizziness Symptoms: Feels off balance, vision changes Frequency: Since Sunday Pertinent Negatives: Patient denies weakness/loss of sensation Disposition: [] ED /[x] Urgent Care (no appt availability in office) / [] Appointment(In office/virtual)/ []  [facility]/ [] Home Care/ [] Refused Recommended Disposition /[] [facility]/ []  Follow-up with PCP Additional Notes: Patient called in to report generalized fatigue and moderate dizziness. Patient stated symptoms started on Sunday. Patient stated she feels "off balance"/dizzy when she walks, but denied recent falls. Patient denied the following: weakness, loss of sensation and changes in speech. Patient stated she has felt similar to this before when she has had viral illnesses in the past. Advised patient to be seen within 24 hours, per protocol. No availability in office. Advised UC. Assisted patient with scheduling an UC visit for today. Provided care advice and instructed patient to call back if symptoms worsen. Patient complied.   Reason for Disposition  [1] MODERATE weakness (i.e., interferes with work, school, normal activities) AND [2] persists > 3 days  Answer Assessment - Initial Assessment Questions 1. DESCRIPTION: "Describe how you are feeling."     "Really fatigued", low energy, no appetite 2. SEVERITY: "How bad is it?"  "Can you stand and walk?"   - MILD (0-3): Feels weak or tired, but does not interfere with work, school or normal activities.   - MODERATE (4-7): Able to stand and walk; weakness interferes with work, school, or normal activities.   - SEVERE (8-10): Unable to stand or walk; unable to do usual activities.     States she can walk  normally, feels off balance, denied recent falls/fainting 3. ONSET: "When did these symptoms begin?" (e.g., hours, days, weeks, months)     Sunday 4. CAUSE: "What do you think is causing the weakness or fatigue?" (e.g., not drinking enough fluids, medical problem, trouble sleeping)     Denies, states she has felt like this before when she's had the flu 5. NEW MEDICINES:  "Have you started on any new medicines recently?" (e.g., opioid pain medicines, benzodiazepines, muscle relaxants, antidepressants, antihistamines, neuroleptics, beta blockers)     Recently started Prozac a month ago 6. OTHER SYMPTOMS: "Do you have any other symptoms?" (e.g., chest pain, fever, cough, SOB, vomiting, diarrhea, bleeding, other areas of pain)     Dizziness, states she noticed blurred vision on Wednesday, denies history of diabetes/high blood pressure, denies weakness/loss of sensation, slight headache (more so dizziness), denies changes in speech 7. PREGNANCY: "Is there any chance you are pregnant?" "When was your last menstrual period?"     Denies  Protocols used: Weakness (Generalized) and Fatigue-A-AH

## 2023-08-28 NOTE — ED Triage Notes (Signed)
 Pt states she has been feeling dizzy and fatigued since Sunday. Pt c/o no pain. Pt states she gets dizzy when she stands up and when she turns her head.

## 2023-08-28 NOTE — ED Provider Notes (Signed)
 [facility]    CSN: [id number] Arrival date & time: 08/28/23  1620      History   Chief Complaint Chief Complaint  Patient presents with   Dizziness    Entered by patient   Fatigue    HPI Melinda Padilla is a 33 y.o. female.   Patient presents with dizziness and fatigue that began on 5/4.  Patient states that the dizziness is worse upon standing and turning her head.  Patient states that she intermittently will have right eye pain that then turns into a right-sided headache and will have intermittent visual disturbances described as double vision.  Patient states that she has had these symptoms intermittently since she was seen in the ER in May of last year.  Patient states that at that time she was seen for hitting her head and diagnosed with a concussion.  Per patient's chart patient was diagnosed with a vertebral artery dissection and started on Plavix  at that time.  Patient has been followed by neurology since this.  The history is provided by the patient and medical records.  Dizziness   Past Medical History:  Diagnosis Date   Heart murmur     Patient Active Problem List   Diagnosis Date Noted   Nausea and vomiting in adult 04/08/2023   Gastroesophageal reflux disease without esophagitis 04/08/2023   Screening examination for STD (sexually transmitted disease) 03/17/2023   Candidiasis 03/17/2023   Class 1 obesity due to excess calories with body mass index (BMI) of 30.0 to 30.9 in adult 03/17/2023   Symptomatic mammary hypertrophy 02/03/2023   Back pain 02/03/2023   Cerumen debris on tympanic membrane, right 01/20/2023   Encounter for gynecological examination without abnormal finding 01/14/2023   Sensation of plugged ear on right side 01/14/2023   ADHD (attention deficit hyperactivity disorder), combined type 11/20/2022   Anxiety 11/20/2022   Smoking addiction 11/20/2022   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    Tobacco abuse 10/20/2022   Impaired memory 10/20/2022   Class 1 obesity due to excess calories with body mass index (BMI) of 31.0 to 31.9 in adult 10/20/2022   Heart murmur 10/14/2022   Frequent headaches 10/14/2022   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      Home Medications    Prior to Admission medications   Medication Sig Start Date End Date Taking? Authorizing Provider  meclizine  (ANTIVERT ) 12.5 MG tablet Take 1 tablet (12.5 mg total) by mouth 3 (three) times daily as needed for dizziness. 08/28/23  Yes Rosevelt Constable, Daelan Gatt A, NP  amitriptyline  (ELAVIL ) 25 MG tablet Take 1 tablet (25 mg total) by mouth at bedtime. 04/06/23   Johny Nap, NP  aspirin  EC 81 MG tablet Take 1 tablet (81 mg total) by mouth daily. Swallow whole. 09/19/22   Lindle Rhea, MD  clobetasol  ointment (TEMOVATE ) 0.05 % Apply 1 Application topically 2 (two) times daily. 01/08/23   Dellar Fenton, DO  FLUoxetine (PROZAC) 10 MG capsule Take 10  mg by mouth daily. 07/28/23   [provider]  metoCLOPramide  (REGLAN ) 10 MG tablet Take 1 tablet (10 mg total) by mouth every 8 (eight) hours as needed for up to 10 days for nausea. 04/02/23 04/12/23  Melodie Spry, NP  pantoprazole  (PROTONIX ) 40 MG tablet Take 1 tablet (40 mg total) by mouth daily. 04/02/23 06/01/23  Melodie Spry, NP    Family History Family History  Problem Relation Age of Onset   Breast cancer Mother        64s   Diabetes Mother    Cancer Mother     Ovarian cancer Mother        48s   Healthy Father    Stroke Maternal Grandmother    Stroke Maternal Grandfather    Heart failure Paternal Grandmother    Kidney failure Paternal Grandmother    Stroke Paternal Grandmother    Cancer Paternal Grandfather     Social History Social History   Tobacco Use   Smoking status: Every Day    Current packs/day: 0.50    Types: Cigarettes    Passive exposure: Current   Smokeless tobacco: Never  Vaping Use   Vaping status: Former  Substance Use Topics   Alcohol use: No   Drug use: Yes    Types: Marijuana     Allergies   Latex   Review of Systems Review of Systems  Neurological:  Positive for dizziness.   Per HPI  Physical Exam Triage Vital Signs ED Triage Vitals  Encounter Vitals Group     BP 08/28/23 1643 117/80     Systolic BP Percentile --      Diastolic BP Percentile --      Pulse Rate 08/28/23 1643 72     Resp 08/28/23 1643 18     Temp 08/28/23 1643 98.5 F (36.9 C)     Temp Source 08/28/23 1643 Oral     SpO2 08/28/23 1643 94 %     Weight --      Height --      Head Circumference --      Peak Flow --      Pain Score 08/28/23 1641 0     Pain Loc --      Pain Education --      Exclude from Growth Chart --    No data found.  Updated Vital Signs BP 117/80 (BP Location: Left Arm)   Pulse 72   Temp 98.5 F (36.9 C) (Oral)   Resp 18   LMP 08/21/2023 (Approximate)   SpO2 94%   Visual Acuity Right Eye Distance:   Left Eye Distance:   Bilateral Distance:    Right Eye Near:   Left Eye Near:    Bilateral Near:     Physical Exam Vitals and nursing note reviewed.  Constitutional:      General: She is awake. She is not in acute distress.    Appearance: Normal appearance. She is well-developed and well-groomed. She is not ill-appearing.  Eyes:     Extraocular Movements: Extraocular movements intact.     Pupils: Pupils are equal, round, and reactive to light.  Cardiovascular:     Rate and Rhythm: Normal  rate and regular rhythm.  Pulmonary:     Effort: Pulmonary effort is normal.     Breath sounds: Normal breath sounds.  Musculoskeletal:        General: Normal range of motion.  Skin:    General: Skin is warm and dry.  Neurological:  General: No focal deficit present.     Mental Status: She is alert and oriented to person, place, and time. Mental status is at baseline.     GCS: GCS eye subscore is 4. GCS verbal subscore is 5. GCS motor subscore is 6.     Cranial Nerves: Cranial nerves 2-12 are intact.     Sensory: Sensation is intact.     Motor: Motor function is intact.     Coordination: Romberg sign positive.     Gait: Gait is intact.  Psychiatric:        Behavior: Behavior is cooperative.      UC Treatments / Results  Labs (all labs ordered are listed, but only abnormal results are displayed) Labs Reviewed  POCT FASTING CBG [facility] MANUAL ENTRY - Normal  POCT URINE PREGNANCY    EKG   Radiology No results found.  Procedures Procedures (including critical care time)  Medications Ordered in UC Medications - No data to display  Initial Impression / Assessment and Plan / UC Course  I have reviewed the triage vital signs and the nursing notes.  Pertinent labs & imaging results that were available during my care of the patient were reviewed by me and considered in my medical decision making (see chart for details).     Patient is well-appearing.  Vitals are stable.  Only notable exam finding was positive Romberg sign.  No other neurodeficits noted.  GCS 15.  EKG revealed normal sinus rhythm without ST elevation, depression, or acute cardiac findings.  CBG was normal.  Urine pregnancy negative.  Prescribed meclizine  as needed for dizziness.  Discussed follow-up, return, and strict ER precautions. Final Clinical Impressions(s) / UC Diagnoses   Final diagnoses:  Vertigo     Discharge Instructions      You can take meclizine  3 times daily as needed for dizziness.   This can make you drowsy so do not drive, work, or drink alcohol while taking this. If your symptoms worsen, or you develop severe headache, worsening vision changes, confusion, slurred speech, severe weakness, one-sided numbness, facial droop, or passing out please seek immediate medical treatment in the emergency department. Otherwise recommend following up with primary care doctor and/or eye doctor as needed. Return here as needed.  ED Prescriptions     Medication Sig Dispense Auth. Provider   meclizine  (ANTIVERT ) 12.5 MG tablet Take 1 tablet (12.5 mg total) by mouth 3 (three) times daily as needed for dizziness. 30 tablet Levora Reas A, NP      PDMP not reviewed this encounter.   Levora Reas A, NP 08/28/23 1850

## 2023-08-28 NOTE — Discharge Instructions (Addendum)
 You can take meclizine  3 times daily as needed for dizziness.  This can make you drowsy so do not drive, work, or drink alcohol while taking this. If your symptoms worsen, or you develop severe headache, worsening vision changes, confusion, slurred speech, severe weakness, one-sided numbness, facial droop, or passing out please seek immediate medical treatment in the emergency department. Otherwise recommend following up with primary care doctor and/or eye doctor as needed. Return here as needed.

## 2023-09-01 ENCOUNTER — Encounter: Payer: Self-pay | Admitting: Family Medicine

## 2023-09-03 ENCOUNTER — Ambulatory Visit (INDEPENDENT_AMBULATORY_CARE_PROVIDER_SITE_OTHER): Admitting: Licensed Clinical Social Worker

## 2023-09-03 DIAGNOSIS — F329 Major depressive disorder, single episode, unspecified: Secondary | ICD-10-CM | POA: Diagnosis not present

## 2023-09-03 NOTE — Progress Notes (Signed)
  [facility] Counselor/Therapist Progress Note  Patient ID: Melinda Padilla, MRN: [id number]    Date: 09/03/23  Time Spent: 11:02  am - 12:00 pm : 58 Minutes  Treatment Type: Individual Therapy.  Reported Symptoms: headaches, neck pain, medication has ben helping, change in brain, heat sensitivity, eating better  Mental Status Exam: Appearance:  Casual     Behavior: Sharing  Motor: Normal  Speech/Language:  Normal Rate  Affect: Appropriate  Mood: normal  Thought process: normal  Thought content:   WNL  Sensory/Perceptual disturbances:   WNL  Orientation: oriented to person, place, and time/date  Attention: Good  Concentration: Good  Memory: WNL  Fund of knowledge:  Good  Insight:   Good  Judgment:  Good  Impulse Control: Good   Risk Assessment: Danger to Self:  No Self-injurious Behavior: No Danger to Others: No Duty to Warn:no Physical Aggression / Violence:No  Access to Firearms a concern: No  Gang Involvement:No   Subjective:   [name] participated from home, via video, and consented to treatment. I discussed the limitations of evaluation and management by telemedicine and the availability of in person appointments. The patient expressed understanding and agreed to proceed.  Therapist participated from home office.  Timmia reviewed the events of the past week.      Interventions: Mindfulness Meditation and Insight-Oriented  Diagnosis:  Reactive depression  Psychiatric Treatment: No , N/A  Treatment Plan:  Client Abilities/Strengths [name] is open and receptive sessions.    Support System: Family and friends  Merchant navy officer of Needs Kylea would like to break unhealthy patterns and cycles in her relationship with partner.     Treatment Level Weekly  Symptoms  Heat intolerance/Headaches   (Status: declined) Overall more aware of mood   (Status: improved)  Goals:   [name]  experiences symptoms of of headaches, dizziness, etc.  Made suggestions to f/u with PCP since she did go to urgent care.  Sustained head injury that has never been evaluated.  Also having heat intolerance since starting meds but overall mood has improved.  Mindful eating suggestions of increasing water, healthy smoothies, better eating before starting night shift, etc.  Challenge to intentional rest, observe patterns and engage in independent activity.     Target Date: 10/01/23 Frequency: Weekly  Progress: 0 Modality: individual    Therapist will provide referrals for additional resources as appropriate.  Therapist will provide psycho-education regarding [name]'s diagnosis and corresponding treatment approaches and interventions. Licensed Clinical Mental Health Counselor, Grandville Lax, [facility] will support the patient's ability to achieve the goals identified. will employ CBT, BA, Problem-solving, Solution Focused, Mindfulness,  coping skills, & other evidenced-based practices will be used to promote progress towards healthy functioning to help manage decrease symptoms associated with her diagnosis.   Reduce overall level, frequency, and intensity of the feelings of depression, anxiety and panic evidenced by increased self awareness, tracking of emotion and ability to self regulate.   Verbally express understanding of the relationship between feelings of depression, anxiety and their impact on thinking patterns and behaviors. Verbalize an understanding of the role that distorted thinking plays in creating fears, excessive worry, and ruminations.  (Cher participated in the creation of the treatment plan)   Grandville Lax, LCMHC

## 2023-09-10 ENCOUNTER — Ambulatory Visit (INDEPENDENT_AMBULATORY_CARE_PROVIDER_SITE_OTHER): Admitting: Licensed Clinical Social Worker

## 2023-09-10 DIAGNOSIS — F329 Major depressive disorder, single episode, unspecified: Secondary | ICD-10-CM | POA: Diagnosis not present
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
## 2023-09-17 ENCOUNTER — Ambulatory Visit (INDEPENDENT_AMBULATORY_CARE_PROVIDER_SITE_OTHER): Admitting: Licensed Clinical Social Worker

## 2023-09-17 DIAGNOSIS — F329 Major depressive disorder, single episode, unspecified: Secondary | ICD-10-CM

## 2023-09-17 DIAGNOSIS — F419 Anxiety disorder, unspecified: Secondary | ICD-10-CM

## 2023-09-17 NOTE — Progress Notes (Signed)
   [facility] Counselor/Therapist Progress Note  Patient ID: Toshia Larkin, MRN: [id number]    Date: 09/17/23  Time Spent: 12:04  pm - 12:57 pm : 53 Minutes  Treatment Type: Individual Therapy.  Reported Symptoms: ruminating and over thinking of recent events with partner that may be underling infidelity  Mental Status Exam: Appearance:  Well Groomed     Behavior: Sharing  Motor: Normal  Speech/Language:  Normal Rate  Affect: Appropriate  Mood: normal  Thought process: normal  Thought content:   WNL  Sensory/Perceptual disturbances:   WNL  Orientation: oriented to person, place, and time/date  Attention: Good  Concentration: Good  Memory: WNL  Fund of knowledge:  Good  Insight:   Good  Judgment:  Good  Impulse Control: Good   Risk Assessment: Danger to Self:  No Self-injurious Behavior: No Danger to Others: No Duty to Warn:no Physical Aggression / Violence:No  Access to Firearms a concern: No  Gang Involvement:No   Subjective:   Aneira Colasanti participated from car, via video, and consented to treatment. I discussed the limitations of evaluation and management by telemedicine and the availability of in person appointments. The patient expressed understanding and agreed to proceed.  Therapist participated from home office.  Skylan reviewed the events of the past week.      Interventions: Narrative and Insight-Oriented  Diagnosis:  Reactive depression  Psychiatric Treatment: No , N/A  Treatment Plan:  Client Abilities/Strengths Jasmynn is open to sessions.    Support System: Family and Friends   Merchant navy officer of Needs Ieshia would like to break unhealthy patterns and cycles in her relationship with partner.       Treatment Level Weekly  Symptoms  Reflecting   (Status: maintained) Over thinking   (Status: maintained)  Goals:   Marveen experiences symptoms of replaying the possible  infidelity events over and over.  Has been over thinking and not sure if she is talking her way out of her beliefs or justifying the behaviors and allowing the relationship to continue.  Depression has changed mood but recent behaviors over the last weeks are indicative of increased anxiety. Trying to get to to a point in recognizing her partners emotional immaturity and her own lack of trust.     Target Date: 10/01/23 Frequency: Weekly  Progress: 0 Modality: individual    Therapist will provide referrals for additional resources as appropriate.  Therapist will provide psycho-education regarding Dalayna's diagnosis and corresponding treatment approaches and interventions. Licensed Clinical Mental Health Counselor, Grandville Lax, [facility] will support the patient's ability to achieve the goals identified. will employ CBT, BA, Problem-solving, Solution Focused, Mindfulness,  coping skills, & other evidenced-based practices will be used to promote progress towards healthy functioning to help manage decrease symptoms associated with her diagnosis.   Reduce overall level, frequency, and intensity of the feelings of depression, anxiety and panic evidenced by decreased rumination and over thinking increasing anxiety.   Verbally express understanding of the relationship between feelings of depression, anxiety and their impact on thinking patterns and behaviors. Verbalize an understanding of the role that distorted thinking plays in creating fears, excessive worry, and ruminations.  (Alvaretta participated in the creation of the treatment plan)   Grandville Lax, LCMHC

## 2023-09-23 ENCOUNTER — Ambulatory Visit: Admitting: Physician Assistant

## 2023-09-24 ENCOUNTER — Encounter: Payer: Self-pay | Admitting: Licensed Clinical Social Worker

## 2023-09-24 ENCOUNTER — Ambulatory Visit: Admitting: Licensed Clinical Social Worker

## 2023-09-24 DIAGNOSIS — F329 Major depressive disorder, single episode, unspecified: Secondary | ICD-10-CM

## 2023-09-24 DIAGNOSIS — F419 Anxiety disorder, unspecified: Secondary | ICD-10-CM | POA: Diagnosis not present
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
## 2023-10-01 ENCOUNTER — Ambulatory Visit: Admitting: Licensed Clinical Social Worker

## 2023-10-01 ENCOUNTER — Encounter: Payer: Self-pay | Admitting: Licensed Clinical Social Worker

## 2023-10-01 DIAGNOSIS — F329 Major depressive disorder, single episode, unspecified: Secondary | ICD-10-CM

## 2023-10-01 DIAGNOSIS — F419 Anxiety disorder, unspecified: Secondary | ICD-10-CM | POA: Diagnosis not present
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
## 2023-10-01 NOTE — Progress Notes (Signed)
 [facility] Counselor/Therapist Progress Note  Patient ID: Melinda Padilla, MRN: [id number]    Date: 10/01/23  Time Spent: 12:01  pm - 12:24 pm : 23 Minutes  Treatment Type: Individual Therapy.  Reported Symptoms: feeling better but now more irritable and sweating a lot, medication management reduced meds and has been adjusting with these side effects   Mental Status Exam: Appearance:  Well Groomed     Behavior: Appropriate  Motor: Normal  Speech/Language:  Normal Rate  Affect: Flat  Mood: irritable  Thought process: circumstantial  Thought content:   WNL  Sensory/Perceptual disturbances:   WNL  Orientation: oriented to person, place, and time/date  Attention: Good  Concentration: Good  Memory: WNL  Fund of knowledge:  Good  Insight:   Good  Judgment:  Good  Impulse Control: Good   Risk Assessment: Danger to Self:  No Self-injurious Behavior: No Danger to Others: No Duty to Warn:no Physical Aggression / Violence:No  Access to Firearms a concern: No  Gang Involvement:No   Subjective:   Melinda Padilla participated from office, via video, and consented to treatment. I discussed the limitations of evaluation and management by telemedicine and the availability of in person appointments. The patient expressed understanding and agreed to proceed.  Therapist participated from home office.  Melinda Padilla reviewed the events of the past week.      Interventions: Solution-Oriented/Positive Psychology  Diagnosis:  Reactive Depression, Anxiety   Psychiatric Treatment: No , N/A  Treatment Plan:  Client Abilities/Strengths Melinda Padilla is open to sessions.    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  Has some financial setbacks occurring and also summer unemployment.     Treatment Level Biweekly  Symptoms  Annoyed/Irritable   (Status:  maintained) Feeling better   (Status: improved)  Goals:   Melinda Padilla experiences symptoms of feeling better about her decision but not irritated with being in the same house as her partner.  Seems to be generally irritated at her efforts to be nice as she does not fee it is authentic and much to late for her to try to make amends.  Not able to move out and recently has a few financial setbacks occurring effecting.  Session was cut short as she was at work and unable to continue our scheduled full session.  Will resume and determine cadence reduction or new goal setting in next session in two weeks.     Target Date: 10/12/23 Frequency: Biweekly  Progress: 0 Modality: individual    Therapist will provide referrals for additional resources as appropriate.  Therapist will provide psycho-education regarding Melinda Padilla's diagnosis and corresponding treatment approaches and interventions. Licensed Clinical Mental Health Counselor, Grandville Lax, [facility] will support the patient's ability to achieve the goals identified. will employ CBT, BA, Problem-solving, Solution Focused, Mindfulness,  coping skills, & other evidenced-based practices will be used to promote progress towards healthy functioning to help manage decrease symptoms associated with her diagnosis.   Reduce overall level, frequency, and intensity of the feelings of depression, anxiety and panic evidenced by decreased  from 6 to 7 days/week to 0 to 1 days/week per client report for at least 3 consecutive months. Verbally express understanding of the relationship between feelings of depression, anxiety and their impact on thinking patterns and behaviors. Verbalize an understanding of the role that distorted thinking plays in creating fears, excessive worry, and ruminations.  (Melinda Padilla participated in  the creation of the treatment plan)   Melinda Padilla, LCMHC

## 2023-10-05 ENCOUNTER — Encounter: Payer: Self-pay | Admitting: Adult Health

## 2023-10-05 ENCOUNTER — Ambulatory Visit: Payer: Medicaid Other | Admitting: Adult Health

## 2023-10-05 ENCOUNTER — Telehealth: Payer: Self-pay | Admitting: Adult Health

## 2023-10-05 VITALS — BP 113/71 | HR 73 | Ht 70.0 in | Wt 202.0 lb

## 2023-10-05 DIAGNOSIS — I7774 Dissection of vertebral artery: Secondary | ICD-10-CM

## 2023-10-05 DIAGNOSIS — G44321 Chronic post-traumatic headache, intractable: Secondary | ICD-10-CM

## 2023-10-05 DIAGNOSIS — G43711 Chronic migraine without aura, intractable, with status migrainosus: Secondary | ICD-10-CM | POA: Diagnosis not present

## 2023-10-05 MED ORDER — AJOVY 225 MG/1.5ML ~~LOC~~ SOAJ
225.0000 mg | SUBCUTANEOUS | 11 refills | Status: DC
Start: 2023-10-05 — End: 2024-01-01

## 2023-10-05 MED ORDER — GABAPENTIN 300 MG PO CAPS: 300.0000 mg | ORAL_CAPSULE | Freq: Three times a day (TID) | ORAL | 11 refills | Status: DC

## 2023-10-05 MED ORDER — ONDANSETRON 4 MG PO TBDP
4.0000 mg | ORAL_TABLET | Freq: Three times a day (TID) | ORAL | 11 refills | Status: DC | PRN
Start: 2023-10-05 — End: 2024-01-01

## 2023-10-05 MED ORDER — RIZATRIPTAN BENZOATE 10 MG PO TBDP
10.0000 mg | ORAL_TABLET | ORAL | 11 refills | Status: DC | PRN
Start: 2023-10-05 — End: 2024-01-01

## 2023-10-05 NOTE — Telephone Encounter (Signed)
 LVM and sent mychart msg asking pt to call back to schedule 4 month f/u with Sharyle Deer to use CPAP slot per Camilo Cella

## 2023-10-05 NOTE — Patient Instructions (Addendum)
 Start Ajovy monthly injection for suspected migrainous headache  Start gabapentin 300 mg nightly while waiting on Ajovy to be approved and take effect  Start rizatriptan as needed at onset of migraine - can repeat x1 after 2 hours if needed  Start zofran  as needed for nausea associated with migraine     Follow up in 4 months or call earlier if needed     Thank you for coming to see us  at [facility]. I hope we have been able to provide you high quality care today.  You may receive a patient satisfaction survey over the next few weeks. We would appreciate your feedback and comments so that we may continue to improve ourselves and the health of our patients.

## 2023-10-05 NOTE — Telephone Encounter (Signed)
 Pt was returning call to schedule appt   Appt Scheduled 4 months out

## 2023-10-12 ENCOUNTER — Ambulatory Visit (INDEPENDENT_AMBULATORY_CARE_PROVIDER_SITE_OTHER): Admitting: Licensed Clinical Social Worker

## 2023-10-12 DIAGNOSIS — F419 Anxiety disorder, unspecified: Secondary | ICD-10-CM | POA: Diagnosis not present

## 2023-10-12 DIAGNOSIS — F329 Major depressive disorder, single episode, unspecified: Secondary | ICD-10-CM

## 2023-10-12 NOTE — Progress Notes (Signed)
 [facility] Counselor/Therapist Progress Note  Patient ID: Tiffiany Beadles, MRN: [id number]    Date: 10/12/23  Time Spent: 3:03  pm - 3:58 pm : 55 Minutes  Treatment Type: Individual Therapy.  Reported Symptoms: moving through feelings of resentment  Mental Status Exam: Appearance:  Well Groomed     Behavior: Sharing  Motor: Normal  Speech/Language:  Normal Rate  Affect: Appropriate  Mood: normal  Thought process: normal  Thought content:   WNL  Sensory/Perceptual disturbances:   WNL  Orientation: oriented to person, place, and time/date  Attention: Good  Concentration: Good  Memory: WNL  Fund of knowledge:  Good  Insight:   Good  Judgment:  Good  Impulse Control: Good   Risk Assessment: Danger to Self:  No Self-injurious Behavior: No Danger to Others: No Duty to Warn:no Physical Aggression / Violence:No  Access to Firearms a concern: No  Gang Involvement:No   Subjective:   Salli Pasquariello participated from home, via video, and consented to treatment. I discussed the limitations of evaluation and management by telemedicine and the availability of in person appointments. The patient expressed understanding and agreed to proceed.  Therapist participated from home office.  Sendy reviewed the events of the past week.      Interventions: Cognitive Behavioral Therapy and Narrative  Diagnosis:  Reactive depression Anxiety  Psychiatric Treatment: No , N/A  Treatment Plan:  Client Abilities/Strengths Cellie is open to sessions.    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     Treatment Level Bi-Weekly  Symptoms  Tired   (Status: maintained) Content/Calm   (Status: improved)  Goals:   Anberlin experiences symptoms of feeling opinionated and thinking that she is falling out with people.   Curious as to why she is disconnected from friends and bumping heads with quite a few people.  Reflecting if this is her pattern. Collaborative goal setting was stagnant today as patient asked for more time to ponder.     Target Date: 10/12/23 Frequency: Bi-Weekly  Progress: 0 Modality: individual    Therapist will provide referrals for additional resources as appropriate.  Therapist will provide psycho-education regarding Mirza's diagnosis and corresponding treatment approaches and interventions. Licensed Clinical Mental Health Counselor, Tawni Louder, [facility] will support the patient's ability to achieve the goals identified. will employ CBT, BA, Problem-solving, Solution Focused, Mindfulness,  coping skills, & other evidenced-based practices will be used to promote progress towards healthy functioning to help manage decrease symptoms associated with her diagnosis.   Reduce overall level, frequency, and intensity of the feelings of depression, anxiety and panic evidenced by decreased reactive and negative thinking.  More self aware and responsive to distinguish healthy and unhealthy responses.   Verbally express understanding of the relationship between feelings of depression, anxiety and their impact on thinking patterns and behaviors. Verbalize an understanding of the role that distorted thinking plays in creating fears, excessive worry, and ruminations.  (Starlyn participated in the creation of the treatment plan)   Tawni Louder, LCMHC

## 2023-10-13 ENCOUNTER — Other Ambulatory Visit (HOSPITAL_COMMUNITY): Payer: Self-pay

## 2023-10-13 ENCOUNTER — Telehealth: Payer: Self-pay

## 2023-10-13 NOTE — Telephone Encounter (Signed)
 Can you look into this?  Order placed 6/16 for Ajovy, assuming a PA will be needed but I do not see anything in the system. Thank you.

## 2023-10-13 NOTE — Telephone Encounter (Signed)
 Pharmacy Patient Advocate Encounter  Received notification from [facility] MEDICAID that Prior Authorization for AJOVY (fremanezumab-vfrm) injection 225MG /1.5ML auto-injectors has been APPROVED from 10/13/2023 to 01/13/2024. Ran test claim, Copay is $4.00. This test claim was processed through [facility]- copay amounts may vary at other pharmacies due to pharmacy/plan contracts, or as the patient moves through the different stages of their insurance plan.   PA #/Case ID/Reference #: PA Case ID #: [id number]

## 2023-10-13 NOTE — Telephone Encounter (Signed)
 Pharmacy Patient Advocate Encounter   Received notification from Patient Advice Request messages that prior authorization for AJOVY (fremanezumab-vfrm) injection 225MG /1.5ML auto-injectors is required/requested.   Insurance verification completed.   The patient is insured through [facility] MEDICAID .   Per test claim: PA required; PA submitted to above mentioned insurance via CoverMyMeds Key/confirmation #/EOC [id number] Status is pending

## 2023-10-27 ENCOUNTER — Ambulatory Visit: Admitting: Licensed Clinical Social Worker

## 2023-10-30 ENCOUNTER — Encounter: Payer: Self-pay | Admitting: Licensed Clinical Social Worker

## 2023-10-30 ENCOUNTER — Ambulatory Visit (INDEPENDENT_AMBULATORY_CARE_PROVIDER_SITE_OTHER): Admitting: Licensed Clinical Social Worker

## 2023-10-30 DIAGNOSIS — F419 Anxiety disorder, unspecified: Secondary | ICD-10-CM

## 2023-10-30 DIAGNOSIS — F329 Major depressive disorder, single episode, unspecified: Secondary | ICD-10-CM | POA: Diagnosis not present

## 2023-10-30 NOTE — Progress Notes (Signed)
  [facility] Counselor/Therapist Progress Note  Patient ID: Melinda Padilla, MRN: [id number]    Date: 10/30/23  Time Spent: 2:04  pm - 3:05 pm : 61 Minutes  Treatment Type: Individual Therapy.  Reported Symptoms: still feels reactive, can be irritated easily  Mental Status Exam: Appearance:  Casual     Behavior: Appropriate  Motor: Normal  Speech/Language:  Normal Rate  Affect: Appropriate  Mood: normal  Thought process: normal  Thought content:   WNL  Sensory/Perceptual disturbances:   WNL  Orientation: oriented to person, place, and time/date  Attention: Good  Concentration: Good  Memory: WNL  Fund of knowledge:  Good  Insight:   Good  Judgment:  Good  Impulse Control: Good   Risk Assessment: Danger to Self:  No Self-injurious Behavior: No Danger to Others: No Duty to Warn:no Physical Aggression / Violence:No  Access to Firearms a concern: No  Gang Involvement:No   Subjective:   Melinda Padilla participated from home, via video, and consented to treatment. I discussed the limitations of evaluation and management by telemedicine and the availability of in person appointments. The patient expressed understanding and agreed to proceed.  Therapist participated from home office.  Cylee reviewed the events of the past week.      Interventions: Cognitive Behavioral Therapy and Mindfulness Meditation  Diagnosis:  Reactive Depression and Anxiety   Psychiatric Treatment: No , N/A  Treatment Plan:  Client Abilities/Strengths Melinda Padilla is open to sessions.  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      Treatment Level Biweekly  Symptoms  Ruminating thoughts   (Status: declined) Irritated   (Status: maintained)  Goals:   Melinda Padilla experiences symptoms of feeling more aware of her needs after  enduring the experiences in dissolving her relationship.  She is more empowered that she is now able to recognize tactics that she has allowed that caused harm.  She watches people closer now and makes better decisions.  Better concept of hurt people hurt people now with grace to thinking about things outside of herself and less personal note.  New Goal: Increase emotional regulation and decrease ruminating thoughts utilizing mindfulness strategies and increasing coping skills.     Target Date: 12/10/23 Frequency: Biweekly  Progress: 0 Modality: individual    Therapist will provide referrals for additional resources as appropriate.  Therapist will provide psycho-education regarding Melinda Padilla diagnosis and corresponding treatment approaches and interventions. Licensed Clinical Mental Health Counselor, Tawni Louder, [facility] will support the patient's ability to achieve the goals identified. will employ CBT, BA, Problem-solving, Solution Focused, Mindfulness,  coping skills, & other evidenced-based practices will be used to promote progress towards healthy functioning to help manage decrease symptoms associated with her diagnosis.   Reduce overall level, frequency, and intensity of the feelings of depression, anxiety and panic evidenced by decreased ruminating thoughts and increase emotional regulation.   Verbally express understanding of the relationship between feelings of depression, anxiety and their impact on thinking patterns and behaviors. Verbalize an understanding of the role that distorted thinking plays in creating fears, excessive worry, and ruminations.  (Melinda Padilla participated in the creation of the treatment plan)   Tawni Louder, LCMHC

## 2023-11-09 ENCOUNTER — Ambulatory Visit (INDEPENDENT_AMBULATORY_CARE_PROVIDER_SITE_OTHER): Admitting: Licensed Clinical Social Worker

## 2023-11-09 DIAGNOSIS — F329 Major depressive disorder, single episode, unspecified: Secondary | ICD-10-CM

## 2023-11-09 DIAGNOSIS — F419 Anxiety disorder, unspecified: Secondary | ICD-10-CM

## 2023-11-09 NOTE — Progress Notes (Signed)
 [facility] Counselor/Therapist Progress Note  Patient ID: Melinda Padilla, MRN: [id number]    Date: 11/09/23  Time Spent: 3:07  pm - 4:00 pm : 53 Minutes  Treatment Type: Individual Therapy.  Reported Symptoms: bored at home on summer break from work  Mental Status Exam: Appearance:  Well Groomed     Behavior: Appropriate  Motor: Normal  Speech/Language:  Normal Rate  Affect: Flat  Mood: normal  Thought process: normal  Thought content:   WNL  Sensory/Perceptual disturbances:   WNL  Orientation: oriented to person, place, and time/date  Attention: Good  Concentration: Good  Memory: WNL  Fund of knowledge:  Good  Insight:   Good  Judgment:  Good  Impulse Control: Good   Risk Assessment: Danger to Self:  No Self-injurious Behavior: No Danger to Others: No Duty to Warn:no Physical Aggression / Violence:No  Access to Firearms a concern: No  Gang Involvement:No   Subjective:   Melinda Padilla participated from home, via video, and consented to treatment. I discussed the limitations of evaluation and management by telemedicine and the availability of in person appointments. The patient expressed understanding and agreed to proceed.  Therapist participated from home office.  Shelina reviewed the events of the past week.      Interventions: Cognitive Behavioral Therapy and Psycho-education/Bibliotherapy  Diagnosis:  Reactive Depression and Anxiety   Psychiatric Treatment: No , N/A  Treatment Plan:  Client Abilities/Strengths Melinda Padilla is open sessions.    Support System: Family and Friends  Melinda Padilla would like to focus on interpersonal relationship maintenance and accountability to self.     Treatment Level Biweekly  Symptoms  Tired/Flat   (Status: maintained) More passive in irritability   (Status: improved)  Goals:   Melinda Padilla experiences symptoms of deciding to step back from  attending to others and feeling the need to help them with their issues.  Realizes that she has been reactive and irritated easily around her partner/roommate.  Has been idle due to summer break but has been bored.  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  Will explore more in career exploration in next session.  BLS Handbook review.     Target Date: 12/10/23 Frequency: Biweekly  Progress: 0 Modality: individual    Therapist will provide referrals for additional resources as appropriate.  Therapist will provide psycho-education regarding Melinda Padilla's diagnosis and corresponding treatment approaches and interventions. Licensed Clinical Mental Health Counselor, Tawni Louder, [facility] will support the patient's ability to achieve the goals identified. will employ CBT, BA, Problem-solving, Solution Focused, Mindfulness,  coping skills, & other evidenced-based practices will be used to promote progress towards healthy functioning to help manage decrease symptoms associated with her diagnosis.   Reduce overall level, frequency, and intensity of the feelings of depression, anxiety and panic evidenced by decreased irritability and anger. Verbally express understanding of the relationship between feelings of depression, anxiety and their impact on thinking patterns and behaviors. Verbalize an understanding of the role that distorted thinking plays in creating fears, excessive worry, and ruminations.  (Melinda Padilla participated in the creation of the treatment plan)   Tawni Louder, LCMHC

## 2023-11-16 ENCOUNTER — Encounter: Payer: Self-pay | Admitting: Psychology

## 2023-11-16 ENCOUNTER — Ambulatory Visit (INDEPENDENT_AMBULATORY_CARE_PROVIDER_SITE_OTHER): Admitting: Psychology

## 2023-11-16 DIAGNOSIS — F329 Major depressive disorder, single episode, unspecified: Secondary | ICD-10-CM | POA: Diagnosis not present

## 2023-11-16 DIAGNOSIS — F419 Anxiety disorder, unspecified: Secondary | ICD-10-CM | POA: Diagnosis not present

## 2023-11-16 DIAGNOSIS — F909 Attention-deficit hyperactivity disorder, unspecified type: Secondary | ICD-10-CM

## 2023-11-16 NOTE — Progress Notes (Deleted)
 [facility] Counselor/Therapist Progress Note  Patient ID: Melinda Padilla, MRN: [id number],    Date: 11/16/2023  Time Spent: ***   Treatment Type: {[facility] AMB THERAPY [phone]}  Reported Symptoms: ***  Mental Status Exam: Appearance:  {PSY:22683}     Behavior: {PSY:21022743}  Motor: {PSY:22302}  Speech/Language:  {PSY:22685}  Affect: {PSY:22687}  Mood: {PSY:31886}  Thought process: {PSY:31888}  Thought content:   {PSY:[id number]}  Sensory/Perceptual disturbances:   {PSY:7312987563}  Orientation: {PSY:30297}  Attention: {PSY:22877}  Concentration: {PSY:574-608-1681}  Memory: {[phone]}  Fund of knowledge:  {PSY:574-608-1681}  Insight:   {PSY:574-608-1681}  Judgment:  {PSY:574-608-1681}  Impulse Control: {PSY:574-608-1681}   Risk Assessment: Danger to Self:  {PSY:22692} Self-injurious Behavior: {PSY:22692} Danger to Others: {PSY:22692} Duty to Warn:{PSY:311194} Physical Aggression / Violence:{PSY:21197} Access to Firearms a concern: {PSY:21197} Gang Involvement:{PSY:21197}  Subjective: ***   Interventions: {PSY:4013248758}  Diagnosis:No diagnosis found.  Plan: ***  Jeff Frieden, PhD
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
## 2023-11-16 NOTE — Progress Notes (Deleted)
   Bryson Dames, PhD

## 2023-11-16 NOTE — Progress Notes (Signed)
   Melinda Dames, PhD

## 2023-11-19 ENCOUNTER — Ambulatory Visit (INDEPENDENT_AMBULATORY_CARE_PROVIDER_SITE_OTHER): Admitting: Licensed Clinical Social Worker

## 2023-11-19 DIAGNOSIS — F329 Major depressive disorder, single episode, unspecified: Secondary | ICD-10-CM | POA: Diagnosis not present

## 2023-11-19 DIAGNOSIS — F419 Anxiety disorder, unspecified: Secondary | ICD-10-CM

## 2023-11-19 NOTE — Progress Notes (Signed)
 [facility] Counselor/Therapist Progress Note  Patient ID: Melinda Padilla, MRN: [id number]    Date: 11/19/23  Time Spent: 12:01  pm - 1:00 pm : 59 Minutes  Treatment Type: Individual Therapy.  Reported Symptoms: feels like she is getting the wrinkles out, don't feel so isolated in my own thoughts and more optimistic   Mental Status Exam: Appearance:  Casual     Behavior: Appropriate  Motor: Normal  Speech/Language:  Normal Rate  Affect: Appropriate  Mood: normal  Thought process: normal  Thought content:   WNL  Sensory/Perceptual disturbances:   WNL  Orientation: oriented to person, place, and time/date  Attention: Good  Concentration: Good  Memory: WNL  Fund of knowledge:  Good  Insight:   Good  Judgment:  Good  Impulse Control: Good   Risk Assessment: Danger to Self:  No Self-injurious Behavior: No Danger to Others: No Duty to Warn:no Physical Aggression / Violence:No  Access to Firearms a concern: No  Gang Involvement:No   Subjective:   Melinda Padilla participated from home, via video, and consented to treatment. I discussed the limitations of evaluation and management by telemedicine and the availability of in person appointments. The patient expressed understanding and agreed to proceed.  Therapist participated from home office.  Melinda Padilla reviewed the events of the past week.    Interventions: Solution-Oriented/Positive Psychology and Insight-Oriented  Diagnosis:  Reactive depression  Anxiety  Psychiatric Treatment: No , N/A  Treatment Plan:  Client Abilities/Strengths Melinda Padilla is open to sessions.    Support System: Family and Friends   Administrator, Civil Service of Needs Melinda Padilla would like to focus on interpersonal relationship maintenance and accountability to self.      Treatment Level Biweekly  Symptoms  Sleepy   (Status: maintained) Optimistic   (Status: improved)  Goals:   Melinda Padilla  experiences symptoms of having to keep her boundaries and remind her partner that they are just cohabitating at this point. Discussed experience with testing psychologist and insight and triggers.  Intrusive thoughts have occurred but has been managing those thoughts of harm and assured she has no intent/plan.  Discussed sates of mind and experiences that trigger emotion mind as it pertains to her middle sister and the dynamics of their relationship prior to and since their mother has passed.     Target Date: 12-14-2023 Frequency: Biweekly  Progress: 0 Modality: individual    Therapist will provide referrals for additional resources as appropriate.  Therapist will provide psycho-education regarding Melinda Padilla's diagnosis and corresponding treatment approaches and interventions. Licensed Clinical Mental Health Counselor, Tawni Louder, [facility] will support the patient's ability to achieve the goals identified. will employ CBT, BA, Problem-solving, Solution Focused, Mindfulness,  coping skills, & other evidenced-based practices will be used to promote progress towards healthy functioning to help manage decrease symptoms associated with her diagnosis.   Reduce overall level, frequency, and intensity of the feelings of depression, anxiety and panic evidenced by decreased negative and intrusive thoughts  Verbally express understanding of the relationship between feelings of depression, anxiety and their impact on thinking patterns and behaviors. Verbalize an understanding of the role that distorted thinking plays in creating fears, excessive worry, and ruminations.  (Melinda Padilla participated in the creation of the treatment plan)   Tawni Louder, LCMHC

## 2023-12-01 ENCOUNTER — Encounter: Payer: Self-pay | Admitting: Psychology

## 2023-12-01 ENCOUNTER — Ambulatory Visit (INDEPENDENT_AMBULATORY_CARE_PROVIDER_SITE_OTHER): Admitting: Psychology

## 2023-12-01 DIAGNOSIS — F419 Anxiety disorder, unspecified: Secondary | ICD-10-CM | POA: Diagnosis not present

## 2023-12-01 DIAGNOSIS — F329 Major depressive disorder, single episode, unspecified: Secondary | ICD-10-CM | POA: Diagnosis not present

## 2023-12-01 DIAGNOSIS — F909 Attention-deficit hyperactivity disorder, unspecified type: Secondary | ICD-10-CM

## 2023-12-01 NOTE — Progress Notes (Signed)
   Melinda Dames, PhD

## 2023-12-01 NOTE — Progress Notes (Signed)
 [facility] Counselor/Therapist Progress Note  Patient ID: Melinda Padilla, MRN: [id number],    Date: 12/01/2023  Time Spent: 9:30 - 11:30 am   Treatment Type: Testing  Met with patient for testing session.  Patient was at the clinic and session was conducted from therapist's office in person.    Reported Symptoms/Reason for Referral: Patient reported a history of attention and learning problems along with childhood trauma and a recent concussion. Patient was previously diagnosed and medicated for ADHD but stopped taking the medication during high school due to concerns about side effects. Current problems include continued difficulty with attention and memory along with intermittent depressed mood, anxiety, compulsive behavior, mania, social interaction difficulty, resistance to change and sensory hyperactivity. Testing recommended to confirm ADHD diagnosis as an adult along with evaluating for other conditions that may be affecting attention, memory, emotion regulation, behavior, and social interaction.   Mental Status Exam: Appearance:  Casual and Neatly Groomed     Behavior: Appropriate  Motor: Normal  Speech/Language:  Clear and Coherent and Normal Rate  Affect: Constricted  Mood: euthymic  Thought process: normal  Thought content:   WNL  Sensory/Perceptual disturbances:   WNL  Orientation: oriented to person, place, time/date, and situation  Attention: Good  Concentration: Fair  Memory: WNL  Fund of knowledge:  Fair  Insight:   Fair  Judgment:  Good  Impulse Control: Good   Risk Assessment: Danger to Self:  No Self-injurious Behavior: No Danger to Others: No Duty to Warn:no Physical Aggression / Violence:No   Subjective: Testing included the K-BIT-2R (0.75 hrs. for testing and scoring) along with the CNS Vital signs (0.75 hrs.), BRIEF-2A (0.25 hrs.) and CAARS-2 (0.25 hrs.).    Patient was cooperative and displayed good effort., but initially forgot the  appointment and arrived one hour late.  Attention and concentration were adequate overall, although patient exhibited several instances of asking questions to be repeated.  Mood was euthymic with appropriate affect.  The results appear representative of current functioning.    Total time for testing: 2.0 hrs.  Diagnosis:Attention deficit hyperactivity disorder (ADHD), unspecified ADHD type  Reactive depression  Anxiety  Plan: Testing to continue next session with the ADOS 2 Module 4, SRS-2 (S & O), and PAI followed by report writing and interactive feedback.     Erika Slaby, PhD

## 2023-12-02 ENCOUNTER — Ambulatory Visit: Admitting: Psychology

## 2023-12-02 ENCOUNTER — Encounter: Payer: Self-pay | Admitting: Psychology

## 2023-12-02 DIAGNOSIS — F329 Major depressive disorder, single episode, unspecified: Secondary | ICD-10-CM | POA: Diagnosis not present

## 2023-12-02 DIAGNOSIS — F419 Anxiety disorder, unspecified: Secondary | ICD-10-CM

## 2023-12-02 DIAGNOSIS — F909 Attention-deficit hyperactivity disorder, unspecified type: Secondary | ICD-10-CM

## 2023-12-02 NOTE — Progress Notes (Signed)
 [facility] Counselor/Therapist Progress Note  Patient ID: Melinda Padilla, MRN: [id number],    Date: 12/02/2023  Time Spent: 12:30 - 3:30 pm   Treatment Type: Testing  Met with patient for testing session.  Patient was at the clinic and session was conducted from therapist's office in person.    Reported Symptoms/Reason for Referral: Patient reported a history of attention and learning problems along with childhood trauma and a recent concussion. Patient was previously diagnosed and medicated for ADHD but stopped taking the medication during high school due to concerns about side effects. Current problems include continued difficulty with attention and memory along with intermittent depressed mood, anxiety, compulsive behavior, mania, social interaction difficulty, resistance to change and sensory hyperactivity. Testing recommended to confirm ADHD diagnosis as an adult along with evaluating for other conditions that may be affecting attention, memory, emotion regulation, behavior, and social interaction.   Mental Status Exam: Appearance:  Casual and Neatly Groomed     Behavior: Appropriate  Motor: Normal  Speech/Language:  Clear and Coherent and Normal Rate  Affect: Constricted - blunted  Mood: Euthymic - depressed  Thought process: normal  Thought content:   WNL  Sensory/Perceptual disturbances:   WNL  Orientation: oriented to person, place, time/date, and situation  Attention: Good  Concentration: Fair  Memory: WNL  Fund of knowledge:  Fair  Insight:   Fair  Judgment:  Good  Impulse Control: Good   Risk Assessment: Danger to Self:  No Self-injurious Behavior: No Danger to Others: No Duty to Warn:no Physical Aggression / Violence:No   Subjective: Testing included the ADOS 2 Module 4 (2.0 hrs. for testing and scoring) along with the PAI  (0.75 hrs.), and SRS-2 (0.25 hrs.).    Patient was cooperative and displayed good effort., but initially forgot the  appointment and arrived one hour late.  Attention and concentration were adequate overall, although patient exhibited several instances of asking questions to be repeated.  Mood was euthymic with appropriate affect.  Patient occasionally initiated social interaction and was adequately responsive to social initiation from the examiner, although she demonstrated little eye contact or emotional expression.  The results appear representative of current functioning.    Total time for testing: 3.0 hrs.  Diagnosis:Attention deficit hyperactivity disorder (ADHD), unspecified ADHD type  Reactive depression  Anxiety  Plan: Testing complete. Report writing to be conducted followed by interactive feedback next session.     Melinda Weinmann, PhD                  Melinda Duer, PhD

## 2023-12-03 ENCOUNTER — Ambulatory Visit (INDEPENDENT_AMBULATORY_CARE_PROVIDER_SITE_OTHER): Admitting: Licensed Clinical Social Worker

## 2023-12-03 DIAGNOSIS — F329 Major depressive disorder, single episode, unspecified: Secondary | ICD-10-CM

## 2023-12-03 DIAGNOSIS — F419 Anxiety disorder, unspecified: Secondary | ICD-10-CM | POA: Diagnosis not present

## 2023-12-03 NOTE — Progress Notes (Signed)
 [facility] Counselor/Therapist Progress Note  Patient ID: Briunna Leicht, MRN: [id number]    Date: 12/03/23  Time Spent: 11:01  am - 11:58 am : 58 Minutes  Treatment Type: Individual Therapy.  Reported Symptoms: had testing for ADHD which caused some increased anxiety and was looking for reassurance if she had done well  Mental Status Exam: Appearance:  Casual     Behavior: Appropriate  Motor: Normal  Speech/Language:  Normal Rate  Affect: Appropriate  Mood: normal  Thought process: normal  Thought content:   WNL  Sensory/Perceptual disturbances:   WNL  Orientation: oriented to person, place, and time/date  Attention: Good  Concentration: Good  Memory: WNL  Fund of knowledge:  Good  Insight:   Good  Judgment:  Good  Impulse Control: Good   Risk Assessment: Danger to Self:  No Self-injurious Behavior: No Danger to Others: No Duty to Warn:no Physical Aggression / Violence:No  Access to Firearms a concern: No  Gang Involvement:No   Subjective:   Joey Placide participated from home, via video, and consented to treatment. I discussed the limitations of evaluation and management by telemedicine and the availability of in person appointments. The patient expressed understanding and agreed to proceed.  Therapist participated from home office.  Lisel reviewed the events of the past week. Realizes she bounces back and forth with things and she struggles with focus. The test caused her some anxiety in not knowing      Interventions: Mindfulness Meditation and Solution-Oriented/Positive Psychology  Diagnosis:  Reactive Depression and Anxiety  Psychiatric Treatment: Yes , N/A  Treatment Plan:  Client Abilities/Strengths Breah is open sessions.    Support System: Family and Friends   Merchant navy officer of Needs Kayliee would like to  focus on interpersonal relationship maintenance and accountability to self.        Treatment Level Biweekly  Symptoms  Lack of Motivation  (Status: maintained) Easily Distracted   (Status: maintained)  Goals:   Dyasia experiences symptoms of attending the pets and music as healthy distractor's when she feels the quiet of her thoughts.  Shared that her thoughts have been a bit quieter since utilizing these strategies.  She also uses the game as a way to distract. This summer she planned to work and then learned they did not have enough money in the budget  The meds make her feel like she has a heat wave on her skin like she is near a heater.  She is also having headaches and feels like she is always hot.  The meds feel dormant to her and not really having an effect on her anymore.  In the beginning it felt like a relief and that something in her mind was breaking down.    Has been using the breath work to mind her thoughts.     Target Date: 12/10/23 Frequency: Biweekly  Progress: 0 Modality: individual    Therapist will provide referrals for additional resources as appropriate.  Therapist will provide psycho-education regarding Elois's diagnosis and corresponding treatment approaches and interventions. Licensed Clinical Mental Health Counselor, Tawni Louder, [facility] will support the patient's ability to achieve the goals identified. will employ CBT, BA, Problem-solving, Solution Focused, Mindfulness,  coping skills, & other evidenced-based practices will be used to promote progress towards healthy functioning to help manage decrease symptoms associated with her diagnosis.   Reduce overall level, frequency, and intensity of the feelings of depression, anxiety and panic evidenced by decreased racing thoughts and  easily distracted and off task. Verbally express understanding of the relationship between feelings of depression, anxiety and their impact on thinking patterns and behaviors. Verbalize an understanding of the role that distorted thinking plays in creating  fears, excessive worry, and ruminations.  (Dwanna participated in the creation of the treatment plan)   Tawni Louder, LCMHC

## 2023-12-08 ENCOUNTER — Encounter: Payer: Self-pay | Admitting: Psychology

## 2023-12-08 NOTE — Progress Notes (Signed)
 Melinda Padilla is a 33 y.o. female patient Report writing competed ( 3 hrs.).  Interactive feedback to be conducted next session. Report to be attached to the feedback progress note.  Patient/Guardian was advised Release of Information must be obtained prior to any record release in order to collaborate their care with an outside provider. Patient/Guardian was advised if they have not already done so to contact the registration department to sign all necessary forms in order for us  to release information regarding their care.   Consent: Patient/Guardian gives verbal consent for treatment and assignment of benefits for services provided during this visit. Patient/Guardian expressed understanding and agreed to proceed.    Peace Jost, PhD

## 2023-12-10 ENCOUNTER — Encounter: Payer: Self-pay | Admitting: Licensed Clinical Social Worker

## 2023-12-10 ENCOUNTER — Ambulatory Visit: Admitting: Licensed Clinical Social Worker

## 2023-12-10 ENCOUNTER — Ambulatory Visit (INDEPENDENT_AMBULATORY_CARE_PROVIDER_SITE_OTHER): Admitting: Licensed Clinical Social Worker

## 2023-12-10 DIAGNOSIS — F329 Major depressive disorder, single episode, unspecified: Secondary | ICD-10-CM

## 2023-12-10 DIAGNOSIS — F419 Anxiety disorder, unspecified: Secondary | ICD-10-CM | POA: Diagnosis not present
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
## 2023-12-11 ENCOUNTER — Ambulatory Visit: Admitting: Psychology

## 2023-12-14 ENCOUNTER — Ambulatory Visit: Admitting: Psychology

## 2023-12-17 ENCOUNTER — Ambulatory Visit (INDEPENDENT_AMBULATORY_CARE_PROVIDER_SITE_OTHER): Admitting: Psychology

## 2023-12-17 ENCOUNTER — Encounter: Payer: Self-pay | Admitting: Psychology

## 2023-12-17 DIAGNOSIS — F902 Attention-deficit hyperactivity disorder, combined type: Secondary | ICD-10-CM | POA: Diagnosis not present

## 2023-12-17 DIAGNOSIS — F4312 Post-traumatic stress disorder, chronic: Secondary | ICD-10-CM | POA: Diagnosis not present

## 2023-12-17 DIAGNOSIS — F3181 Bipolar II disorder: Secondary | ICD-10-CM | POA: Diagnosis not present

## 2023-12-17 DIAGNOSIS — F4310 Post-traumatic stress disorder, unspecified: Secondary | ICD-10-CM

## 2023-12-17 NOTE — Progress Notes (Signed)
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 activities and very low reaction time.   Conversely, attention for complex activities, processing speed, memory, and recognizing emotional expression were age typical.  Self-report ratings for execution function (BRIEF 2A) indicated impaired development overall with a highly elevated scores regarding behavior, emotion, and thought regulation.  Self-report ratings of behavior and functioning (CAARS-2) showed highly impaired functioning regarding inattention and hyperactivity, with mildly impaired impulse control and emotion regulation. Other self-report ratings indicated severe levels of anxiety, traumatic stress, obsessive-compulsive tendencies, mania, and depressed mood with borderline personality traits, aggression, and low self-esteem.  The validity scales on this measure indicated a high level of overall distress leading to possibly inflated ratings.  Direct testing for ASD indicated some difficulty with reciprocal social interaction and nonverbal communication, with the only instance of restricted repetitive behavior observed during testing being a restricted range of emotional expression.  Patient reported a higher level of atypical behavior on rating scales suggesting that masking of odd behavior took place during the observations.  On the other  hand, observer report  ratings from Ms. Tersigni's sister indicated typical functioning regarding social interaction and behavior.  The results do not meet the criteria for ASD, as clinically significant impaired social functioning and restricted-repetitive behavior (RRB) were not noted across settings.  In general, Ms. Thrush's behavior patterns appear mostly related to the effects of trauma and mood dysregulation, although underlying attention deficits may be present.  See below for recommendations.          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 Continue medication for managing anxiety and depressed mood.  Prescribe stimulant medication for attention and processing deficits with caution, as previous stimulant medication led to hyper stimulation and lack of focus.  Consider nonstimulant medications with attention raising properties such as Guanfacine/Intuniv and Wellbutrin  Emotion.  Medication for regulating anxiety and mood may also be helpful.  Emotion regulation and attention can also be increased by increasing physical conditioning through adequate sleep, good nutrition, and exercise. Individual counseling is recommended to continue with an emphasis on helping Ms. Zappulla reconcile with childhood trauma and learn to better regulate her behavior and emotion.  This would include compensation strategies for executive function deficits.  A skills-based approach such as Acceptance and Commitment Therapy or Dialectical Behavior Therapy (DBT), is recommended considering her intense emotion regulation difficulty and history of trauma.  The trauma may also need to be addressed through exercises such as Eye Movement Desensitization Retraining (EMDR), Brain Spotting, and Tapping/EFT.  A therapist experienced in trauma is recommended.  [facility] provides group and individual therapy using DBT.      Socially, Ms. Castillo would benefit from participation in social activity like clubs or interest groups that are small and structured along with having clear rules, guidelines and activities. Re-evaluation for ADHD is recommended in one year, following treatment for trauma and emotion regulation issues.      Elspeth MOTE Mitzi Lilja, Ph.D. Licensed Psychologist - [facility] [phone]               Arli Bree, PhD

## 2023-12-17 NOTE — Progress Notes (Signed)
   Melinda Dames, PhD

## 2023-12-17 NOTE — Progress Notes (Signed)
 [facility] Counselor/Therapist Progress Note  Patient ID: Melinda Padilla, MRN: [id number],    Date: 12/17/2023  Time Spent: 10:30 - 11:20 am   Treatment Type: Testing - Feedback Session  Met with patient to review results of testing.  Patient was at home and session was conducted from therapist's office via video conferencing.  Patient understood the limitations of video appointments and verbally consented to telehealth.       Reported Symptoms: Patient reported a history of attention and learning problems along with childhood trauma and a recent concussion. Patient was previously diagnosed and medicated for ADHD but stopped taking the medication during high school due to concerns about side effects. Current problems include continued difficulty with attention and memory along with intermittent depressed mood, anxiety, compulsive behavior, mania, social interaction difficulty, resistance to change and sensory hyperactivity. Testing recommended to confirm ADHD diagnosis as an adult along with evaluating for other conditions that may be affecting attention, memory, emotion regulation, behavior, and social interaction.   Subjective: Interactive feedback was conducted (1 hr.).  It was discussed how patient met the criterion for ADHD along with how her trauma and mood regulation ability affect her ability to focus and relate to others.   Recommendations included discussing results with PCP, developing a visual organization system, continuing individual counseling, and seeking appropriate community support.  Patient expressed agreement with the results and recommendations.     Total Time: 4 hrs. Interactive Feedback:1 hr. Report Writing: 3 hrs.   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     Leylah Tarnow, PhD

## 2023-12-22 ENCOUNTER — Ambulatory Visit (INDEPENDENT_AMBULATORY_CARE_PROVIDER_SITE_OTHER): Admitting: Orthopaedic Surgery

## 2023-12-22 ENCOUNTER — Other Ambulatory Visit (INDEPENDENT_AMBULATORY_CARE_PROVIDER_SITE_OTHER)

## 2023-12-22 DIAGNOSIS — M25571 Pain in right ankle and joints of right foot: Secondary | ICD-10-CM

## 2023-12-22 DIAGNOSIS — M76821 Posterior tibial tendinitis, right leg: Secondary | ICD-10-CM

## 2023-12-22 NOTE — Progress Notes (Signed)
 Office Visit Note   Patient: Melinda Padilla           Date of Birth: 1990/06/16           MRN: [id number] Visit Date: 12/22/2023              Requested by: Petrina Pries, NP [address] [place],  [place] [address] PCP: Petrina Pries, NP   Assessment & Plan: Visit Diagnoses:  1. Acute right ankle pain   2. Posterior tibial tendon dysfunction (PTTD) of right lower extremity     Plan: History of Present Illness Melinda Padilla is a 33 year old female with flat feet who presents with medial ankle pain. She was referred by [facility] for evaluation of her ankle pain.  Two weeks ago, she began experiencing pain in the medial aspect of her ankle, specifically behind the medial malleolus. The area is tender, especially when walking, and the pain is localized without symptoms on the lateral side of the ankle. She has flat feet and uses orthotics for support. She engages in physical therapy exercises, including those involving towels, to strengthen the tendon. She recalls hitting her ankle, which caused temporary coldness and pain in her toes, resolving within twenty minutes after sitting down. She noticed an increased pulling sensation in the area following the incident.  Physical Exam MUSCULOSKELETAL: Tenderness behind the medial malleolus of the ankle along posterior tibial tendon.  The tendon is stable behind the medial malleolus.  Lateral surgical scar is fully healed.  Results RADIOLOGY Ankle X-ray: Internal fixation with plate and screws in place, no displacement, symptoms on medial side, appears satisfactory (12/22/2023)  Assessment and Plan Right posterior tibial tendinitis with associated right ankle pain Chronic tendinitis flare-up due to flat feet. X-rays confirm stable hardware from previous procedure. - Continue orthotics for arch support. - Refer to physical therapy for exercises.  Follow-Up Instructions: No follow-ups on file.   Orders:  Orders Placed This  Encounter  Procedures   XR Ankle Complete Right   Ambulatory referral to Physical Therapy   No orders of the defined types were placed in this encounter.     Procedures: No procedures performed   Clinical Data: No additional findings.   Subjective: Chief Complaint  Patient presents with   Right Ankle - Pain    HPI  Review of Systems  Constitutional: Negative.   HENT: Negative.    Eyes: Negative.   Respiratory: Negative.    Cardiovascular: Negative.   Endocrine: Negative.   Musculoskeletal: Negative.   Neurological: Negative.   Hematological: Negative.   Psychiatric/Behavioral: Negative.    All other systems reviewed and are negative.    Objective: Vital Signs: There were no vitals taken for this visit.  Physical Exam Vitals and nursing note reviewed.  Constitutional:      Appearance: She is well-developed.  HENT:     Head: Atraumatic.     Nose: Nose normal.  Eyes:     Extraocular Movements: Extraocular movements intact.  Cardiovascular:     Pulses: Normal pulses.  Pulmonary:     Effort: Pulmonary effort is normal.  Abdominal:     Palpations: Abdomen is soft.  Musculoskeletal:     Cervical back: Neck supple.  Skin:    General: Skin is warm.     Capillary Refill: Capillary refill takes less than 2 seconds.  Neurological:     Mental Status: She is alert. Mental status is at baseline.  Psychiatric:  Behavior: Behavior normal.        Thought Content: Thought content normal.        Judgment: Judgment normal.     Ortho Exam  Specialty Comments:  No specialty comments available.  Imaging: XR Ankle Complete Right Result Date: 12/22/2023 X-ray of the right ankle show prior plate and screw construct from ORIF without any complications.  There is degenerative spurring of the ankle joint and of the midfoot.  No acute abnormalities.    PMFS History: Patient Active Problem List   Diagnosis Date Noted   Nausea and vomiting in adult 04/08/2023    Gastroesophageal reflux disease without esophagitis 04/08/2023   Screening examination for STD (sexually transmitted disease) 03/17/2023   Candidiasis 03/17/2023   Class 1 obesity due to excess calories with body mass index (BMI) of 30.0 to 30.9 in adult 03/17/2023   Symptomatic mammary hypertrophy 02/03/2023   Back pain 02/03/2023   Cerumen debris on tympanic membrane, right 01/20/2023   Encounter for gynecological examination without abnormal finding 01/14/2023   Sensation of plugged ear on right side 01/14/2023   ADHD (attention deficit hyperactivity disorder), combined type 11/20/2022   Anxiety 11/20/2022   Smoking addiction 11/20/2022   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   Tobacco abuse 10/20/2022   Impaired memory 10/20/2022   Class 1 obesity due to excess calories with body mass index (BMI) of 31.0 to 31.9 in adult 10/20/2022   Heart murmur 10/14/2022   Frequent headaches 10/14/2022   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   Recurrent epistaxis 08/19/2022   Displaced fracture of lateral malleolus of right fibula, initial encounter for closed fracture    Past Medical History:  Diagnosis Date   Heart murmur     Family History  Problem Relation Age of Onset   Breast cancer Mother        63s   Diabetes Mother    Cancer Mother    Ovarian cancer Mother        60s   Healthy Father    Stroke Maternal Grandmother    Stroke Maternal Grandfather    Heart failure Paternal Grandmother    Kidney failure Paternal Grandmother    Stroke Paternal Grandmother    Cancer Paternal Grandfather     Past Surgical History:  Procedure Laterality Date   NO PAST SURGERIES     ORIF ANKLE FRACTURE Right 11/26/2020   Procedure: OPEN REDUCTION INTERNAL FIXATION (ORIF) RIGHT LATERAL MALLEOLUS;  Surgeon: Jerri Kay HERO, MD;  Location: [facility];  Service: Orthopedics;  Laterality: Right;   Social History   Occupational History   Not on  file  Tobacco Use   Smoking status: Every Day    Current packs/day: 0.50    Types: Cigarettes    Passive exposure: Current   Smokeless tobacco: Never  Vaping Use   Vaping status: Former  Substance and Sexual Activity   Alcohol use: No   Drug use: Yes    Types: Marijuana   Sexual activity: Never    Birth control/protection: None

## 2023-12-24 ENCOUNTER — Ambulatory Visit: Admitting: Licensed Clinical Social Worker

## 2023-12-24 DIAGNOSIS — F329 Major depressive disorder, single episode, unspecified: Secondary | ICD-10-CM | POA: Diagnosis not present

## 2023-12-24 DIAGNOSIS — F419 Anxiety disorder, unspecified: Secondary | ICD-10-CM
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
## 2023-12-25 ENCOUNTER — Ambulatory Visit: Admitting: Psychology

## 2023-12-25 ENCOUNTER — Encounter: Payer: Self-pay | Admitting: Psychology

## 2023-12-25 DIAGNOSIS — F4310 Post-traumatic stress disorder, unspecified: Secondary | ICD-10-CM

## 2023-12-25 DIAGNOSIS — F3181 Bipolar II disorder: Secondary | ICD-10-CM | POA: Diagnosis not present

## 2023-12-25 DIAGNOSIS — F902 Attention-deficit hyperactivity disorder, combined type: Secondary | ICD-10-CM | POA: Diagnosis not present

## 2023-12-25 NOTE — Progress Notes (Signed)
   Bryson Dames, PhD
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
## 2024-01-01 ENCOUNTER — Ambulatory Visit: Admitting: Adult Health

## 2024-01-01 ENCOUNTER — Encounter: Payer: Self-pay | Admitting: Psychology

## 2024-01-01 ENCOUNTER — Encounter: Payer: Self-pay | Admitting: Adult Health

## 2024-01-01 ENCOUNTER — Ambulatory Visit (INDEPENDENT_AMBULATORY_CARE_PROVIDER_SITE_OTHER): Admitting: Psychology

## 2024-01-01 VITALS — BP 114/81 | HR 76 | Ht 70.0 in | Wt 204.0 lb

## 2024-01-01 DIAGNOSIS — G43711 Chronic migraine without aura, intractable, with status migrainosus: Secondary | ICD-10-CM

## 2024-01-01 DIAGNOSIS — F4310 Post-traumatic stress disorder, unspecified: Secondary | ICD-10-CM | POA: Diagnosis not present

## 2024-01-01 DIAGNOSIS — F3181 Bipolar II disorder: Secondary | ICD-10-CM

## 2024-01-01 DIAGNOSIS — H4710 Unspecified papilledema: Secondary | ICD-10-CM

## 2024-01-01 DIAGNOSIS — I7774 Dissection of vertebral artery: Secondary | ICD-10-CM | POA: Diagnosis not present

## 2024-01-01 DIAGNOSIS — H538 Other visual disturbances: Secondary | ICD-10-CM

## 2024-01-01 DIAGNOSIS — G44321 Chronic post-traumatic headache, intractable: Secondary | ICD-10-CM

## 2024-01-01 DIAGNOSIS — J0110 Acute frontal sinusitis, unspecified: Secondary | ICD-10-CM

## 2024-01-01 MED ORDER — RIZATRIPTAN BENZOATE 10 MG PO TBDP: 10.0000 mg | ORAL_TABLET | ORAL | 11 refills | Status: DC | PRN

## 2024-01-01 MED ORDER — ONDANSETRON 4 MG PO TBDP: 4.0000 mg | ORAL_TABLET | Freq: Three times a day (TID) | ORAL | 11 refills | Status: DC | PRN

## 2024-01-01 MED ORDER — METHYLPREDNISOLONE 4 MG PO TBPK: ORAL_TABLET | ORAL | 0 refills | Status: DC

## 2024-01-01 MED ORDER — EMGALITY 120 MG/ML ~~LOC~~ SOAJ: 120.0000 mg | SUBCUTANEOUS | 11 refills | Status: DC

## 2024-01-01 MED ORDER — GABAPENTIN 300 MG PO CAPS: 300.0000 mg | ORAL_CAPSULE | Freq: Three times a day (TID) | ORAL | 11 refills | Status: DC

## 2024-01-01 NOTE — Patient Instructions (Addendum)
 Your Plan:  Recommend starting Emgality  monthly injection for further migraine management  Start gabapentin  300mg  three times daily for further headache management - start by taking 300mg  nightly for the first several days and gradually add daytime dosages if needed  Use rizatriptan  as needed for more severe migraine, can repeat after 2 hours if needed. Do not use more than 2-3 times per week or 8 times per month  Start Zofran  as needed for nausea  Start steroid taper pack for persistent headache. Do question underlying sinus infection contributing. The steroids should also help reduce this inflammation. Use over the counter decongestants as well which can help relieve ear pressure. Unable to tell if you have an ear infection due to wax build up, please follow up with your PCP who can help clear this up for you. Do routine nasal rinses and over the counter nasal sprays.  Follow up with your PCP if symptoms persist.   Referral placed to [facility] care ophthalmology - you will be called to schedule     Follow up in 4 months or call earlier if needed     Thank you for coming to see us  at [facility]. I hope we have been able to provide you high quality care today.  You may receive a patient satisfaction survey over the next few weeks. We would appreciate your feedback and comments so that we may continue to improve ourselves and the health of our patients.
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
## 2024-01-04 ENCOUNTER — Telehealth: Payer: Self-pay | Admitting: Pharmacist

## 2024-01-04 ENCOUNTER — Telehealth: Payer: Self-pay | Admitting: Adult Health

## 2024-01-04 ENCOUNTER — Ambulatory Visit: Admitting: Licensed Clinical Social Worker

## 2024-01-04 NOTE — Telephone Encounter (Signed)
 Referral for Ophthalmology faxed to [facility] Phone# [phone] Fax [phone]

## 2024-01-04 NOTE — Telephone Encounter (Signed)
 Pharmacy Patient Advocate Encounter  Received notification from [employer] that Prior Authorization for EMGALITY  120 MG/ML [place] SOAJ has been APPROVED from 01/04/2024 to 04/04/2024   PA #/Case ID/Reference #: [id number]

## 2024-01-04 NOTE — Telephone Encounter (Signed)
 Pharmacy Patient Advocate Encounter   Received notification from Patient Pharmacy that prior authorization for Emgality  120MG /ML auto-injectors (migraine) is required/requested.   Insurance verification completed.   The patient is insured through [facility] .   Per test claim: PA required; PA submitted to above mentioned insurance via Latent Key/confirmation #/EOC [id number] Status is pending

## 2024-01-07 ENCOUNTER — Ambulatory Visit: Attending: Orthopaedic Surgery | Admitting: Physical Therapy

## 2024-01-07 NOTE — Therapy (Incomplete)
 OUTPATIENT PHYSICAL THERAPY LOWER EXTREMITY EVALUATION   Patient Name: Melinda Padilla MRN: [id number] DOB:February 24, 1991, 33 y.o., female Today's Date: 01/07/2024  END OF SESSION:   Past Medical History:  Diagnosis Date   Heart murmur    Past Surgical History:  Procedure Laterality Date   NO PAST SURGERIES     ORIF ANKLE FRACTURE Right 11/26/2020   Procedure: OPEN REDUCTION INTERNAL FIXATION (ORIF) RIGHT LATERAL MALLEOLUS;  Surgeon: Jerri Kay HERO, MD;  Location: [facility];  Service: Orthopedics;  Laterality: Right;   Patient Active Problem List   Diagnosis Date Noted   Nausea and vomiting in adult 04/08/2023   Gastroesophageal reflux disease without esophagitis 04/08/2023   Screening examination for STD (sexually transmitted disease) 03/17/2023   Candidiasis 03/17/2023   Class 1 obesity due to excess calories with body mass index (BMI) of 30.0 to 30.9 in adult 03/17/2023   Symptomatic mammary hypertrophy 02/03/2023   Back pain 02/03/2023   Cerumen debris on tympanic membrane, right 01/20/2023   Encounter for gynecological examination without abnormal finding 01/14/2023   Sensation of plugged ear on right side 01/14/2023   ADHD (attention deficit hyperactivity disorder), combined type 11/20/2022   Anxiety 11/20/2022   Smoking addiction 11/20/2022   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   Tobacco abuse 10/20/2022   Impaired memory 10/20/2022   Class 1 obesity due to excess calories with body mass index (BMI) of 31.0 to 31.9 in adult 10/20/2022   Heart murmur 10/14/2022   Frequent headaches 10/14/2022   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   Recurrent epistaxis 08/19/2022   Displaced fracture of lateral malleolus of right fibula, initial encounter for closed fracture     PCP: Petrina Pries, NP  REFERRING PROVIDER: Jerri Kay HERO, MD  REFERRING DIAG:  [phone] (ICD-10-CM) - Acute right ankle pain  M76.821 (ICD-10-CM) -  Posterior tibial tendon dysfunction (PTTD) of right lower extremity    Rationale for Evaluation and Treatment: Rehabilitation  THERAPY DIAG:  No diagnosis found.  PERTINENT HISTORY: Fx of R lateral malleolus, vertebral artery dissection, impaired memory  WEIGHT BEARING RESTRICTIONS: {Yes ***/No:24003}  FALLS:  Has patient fallen in last 6 months? {fallsyesno:27318}  LIVING ENVIRONMENT: Lives with: {[facility] lives with:25569::lives with their family} Lives in: {Lives in:25570} Stairs: {opstairs:27293} Has following equipment at home: {Assistive devices:23999}  OCCUPATION: ***   PRECAUTIONS: {Therapy precautions:24002} ---------------------------------------------------------------------------------------------  SUBJECTIVE:   SUBJECTIVE STATEMENT: Eval statement 01/07/2024: ***  RED FLAGS: {PT Red Flags:29287}   PLOF: {PLOF:24004}  PATIENT GOALS: ***  NEXT MD VISIT: *** ---------------------------------------------------------------------------------------------  OBJECTIVE:  Note: Objective measures were completed at Evaluation unless otherwise noted.  DIAGNOSTIC FINDINGS: ***  PATIENT SURVEYS:  {rehab surveys:24030}  COGNITION: Overall cognitive status: {cognition:24006}     SENSATION: {sensation:27233}  EDEMA:  ***  MUSCLE LENGTH: Hamstrings: Right *** deg; Left *** deg Debby test: Right *** deg; Left *** deg  POSTURE: {posture:25561}  PALPATION: ***  LOWER EXTREMITY ROM:  {AROM/PROM:27142} ROM Right eval Left eval  Hip flexion    Hip extension    Hip abduction    Hip adduction    Hip internal rotation    Hip external rotation    Knee flexion    Knee extension    Ankle dorsiflexion    Ankle plantarflexion    Ankle inversion    Ankle eversion     (Blank rows = not tested)  ! Indicates pain with testing  LOWER EXTREMITY MMT:  MMT Right eval Left eval  Hip  flexion    Hip extension    Hip abduction    Hip adduction    Hip  internal rotation    Hip external rotation    Knee flexion    Knee extension    Ankle dorsiflexion    Ankle plantarflexion    Ankle inversion    Ankle eversion     (Blank rows = not tested)  ! Indicates pain with testing  LOWER EXTREMITY SPECIAL TESTS:  {LEspecialtests:26242}  FUNCTIONAL TESTS:  {Functional tests:24029}  GAIT: Distance walked: *** Assistive device utilized: {Assistive devices:23999} Level of assistance: {Levels of assistance:24026} Comments: ***                                                                                                                                [facility] Adult PT Treatment:                                                DATE: 01/07/2024  Therapeutic Exercise: *** Manual Therapy: *** Neuromuscular re-ed: *** Therapeutic Activity: *** Modalities: *** Self Care: Pt education, detailed below POC discussion    PATIENT EDUCATION:  Education details: Pt received education regarding HEP performance, ADL performance, functional activity tolerance, impairment education, appropriate performance of therapeutic activities.*** Person educated: {Person educated:25204} Education method: {Education Method:25205} Education comprehension: {Education Comprehension:25206}  HOME EXERCISE PROGRAM: *** ---------------------------------------------------------------------------------------------  ASSESSMENT:  CLINICAL IMPRESSION: Eval impression (01/07/2024): Pt. attended today's physical therapy session for evaluation of ***. Pt has complaints of ***. Pt has notable deficits with ***.  Signs and symptoms are concurrent with ***. Pt would benefit from therapeutic focus on ***.  Treatment performed today focused on *** Pt demonstrated *** understanding of education provided. required *** cues and *** assistance for appropriate performance with today's activities. Pt requires the intervention of skilled outpatient physical therapy to address the  aforementioned deficits and progress towards a functional level in line with therapeutic goals.   OBJECTIVE IMPAIRMENTS: {opptimpairments:25111}.   ACTIVITY LIMITATIONS: {activitylimitations:27494}  PARTICIPATION LIMITATIONS: {participationrestrictions:25113}  PERSONAL FACTORS: {Personal factors:25162} are also affecting patient's functional outcome.   REHAB POTENTIAL: {rehabpotential:25112}  CLINICAL DECISION MAKING: {clinical decision making:25114}  EVALUATION COMPLEXITY: {Evaluation complexity:25115}   GOALS: Goals reviewed with patient? YES  SHORT TERM GOALS: Target date: *** Pt will be independent with administered HEP to demonstrate the competency necessary for long term managemnet of symptoms at home.  Baseline: Goal status: INITIAL  2.  *** Baseline:  Goal status: INITIAL  3.  *** Baseline:  Goal status: INITIAL  4.  *** Baseline:  Goal status: INITIAL  5.  *** Baseline:  Goal status: INITIAL  6.  *** Baseline:  Goal status: INITIAL  LONG TERM GOALS: Target date: ***  Pt. Will achieve a LEFS score of *** as to demonstrate improvement in self-perceived functional ability  with daily activities.  Baseline:  Goal status: INITIAL  2.  Pt will improve Global Hip/knee strength to a ***/5 to demonstrate improvement in strength for quality of motion and activity performance.  Baseline:  Goal status: INITIAL  3.  *** Baseline:  Goal status: INITIAL  4.  *** Baseline:  Goal status: INITIAL  5.  *** Baseline:  Goal status: INITIAL  6.  *** Baseline:  Goal status: INITIAL  --------------------------------------------------------------------------------------------- PLAN:  PT FREQUENCY: 1-2x/week  PT DURATION: 6 weeks  PLANNED INTERVENTIONS: 97110-Therapeutic exercises, 97530- Therapeutic activity, 97112- Neuromuscular re-education, 97535- Self Care, 02859- Manual therapy, 97116- Gait training, [phone] (1-2 muscles), 20561 (3+ muscles)- Dry  Needling, Patient/Family education, Balance training, Stair training, Taping, Joint mobilization, DME instructions, Cryotherapy, and Moist heat  PLAN FOR NEXT SESSION: Review HEP, Begin POC as detailed in the assessment   Mabel Kiang, PT, DPT 01/07/2024, 9:52 AM

## 2024-01-08 ENCOUNTER — Encounter: Payer: Self-pay | Admitting: Psychology

## 2024-01-08 ENCOUNTER — Ambulatory Visit (INDEPENDENT_AMBULATORY_CARE_PROVIDER_SITE_OTHER): Admitting: Psychology

## 2024-01-08 DIAGNOSIS — F3181 Bipolar II disorder: Secondary | ICD-10-CM | POA: Diagnosis not present

## 2024-01-08 DIAGNOSIS — F4310 Post-traumatic stress disorder, unspecified: Secondary | ICD-10-CM | POA: Diagnosis not present

## 2024-01-08 NOTE — Progress Notes (Signed)
 [facility] Counselor/Therapist Progress Note  Patient ID: Melinda Padilla, MRN: [id number],    Date: 01/08/2024  Time Spent: 4:00 - 4:45 pm   Met with patient for therapy session.  Patient was at the home and session was conducted from therapist's office via video.  Patient expressed awareness of the limitations related to video sessions and verbally consented to telehealth.      Treatment Type: Individual Therapy  Reported Symptoms: Patient reported a history of attention and learning problems along with childhood trauma and a recent concussion. Patient was previously diagnosed and medicated for ADHD but stopped taking the medication during high school due to concerns about side effects. Current problems include continued difficulty with attention and memory along with intermittent depressed mood, anxiety, compulsive behavior, mania, social interaction difficulty, resistance to change and sensory hyperactivity.   Mental Status Exam: Appearance:  Neatly dressed and groomed    Behavior: Appropriate and Sharing  Motor: Restlestness  Speech/Language:  Clear and Coherent and Normal Rate  Affect: Blunt and Depressed  Mood: dysthymic  Thought process: concrete  Thought content:   WNL  Sensory/Perceptual disturbances:   WNL  Orientation: oriented to person, place, time/date, and situation  Attention: Good  Concentration: Good  Memory: WNL  Fund of knowledge:  Fair  Insight:   Good  Judgment:  Good  Impulse Control: Good   Risk Assessment: Danger to Self:  No Self-injurious Behavior: No Danger to Others: No Duty to Warn:no Physical Aggression / Violence:No  Access to Firearms a concern: No  Gang Involvement:No   Subjective: Patient mentioned continuing to have difficultly processing her emotions related to her relationship difficulties.  Her partner has denied the allegations from the patient but the patient does not believe or trust her.  This has led to much  frustration.  She has avoided deciding upon whether to continue with the relationship, as her partner stated that sh is trying to improve herself through therapy.       Interventions: Cognitive Behavioral Therapy, Mindfulness Meditation, and Acceptance and Commitment therapy .  Acceptance and mindfulness techniques, including acknowledging thoughts and feelings when aware of them and consciously choosing to believe and bring attention to those thoughts and most that are most relevant, present, and helpful.      Assessment: Patient seems like  she wants to leave the relationship but has not committed to do so.  Additionally, patient requested to have another observer (someone who knows her better) complete the adult ASD rating form as part of her most recent evaluation.    Diagnosis:PTSD (post-traumatic stress disorder)  Bipolar II disorder (HCC)  Plan: Therapy sessions recommended to help patient with emotion regulation, behavior control, and social interaction.  Goal: Improve emotional awareness and regulation    Objective: Patient to practice deep breathing and other physical calming exercises during at least 80% of days.  Target date: 06/18/2024 Progress: 10   Objective: Patient to practice mindful awareness exercises during at least 80% of days.  Target date: 06/18/2024 Progress: 5   Objective: Patient to notice thoughts that are either exaggerated, not likely to happen, related to past events, or out of her control during at least 80% of instances.  Target date: 12/19/2024 Progress: 10   Demetria Iwai, PhD                                Fritz Cauthon, PhD

## 2024-01-09 ENCOUNTER — Encounter: Payer: Self-pay | Admitting: Adult Health

## 2024-01-19 NOTE — Therapy (Deleted)
 OUTPATIENT PHYSICAL THERAPY LOWER EXTREMITY EVALUATION   Patient Name: Melinda Padilla MRN: [id number] DOB:Mar 22, 1991, 33 y.o., female Today's Date: 01/19/2024  END OF SESSION:   Past Medical History:  Diagnosis Date   Heart murmur    Past Surgical History:  Procedure Laterality Date   NO PAST SURGERIES     ORIF ANKLE FRACTURE Right 11/26/2020   Procedure: OPEN REDUCTION INTERNAL FIXATION (ORIF) RIGHT LATERAL MALLEOLUS;  Surgeon: Jerri Kay HERO, MD;  Location: [facility];  Service: Orthopedics;  Laterality: Right;   Patient Active Problem List   Diagnosis Date Noted   Nausea and vomiting in adult 04/08/2023   Gastroesophageal reflux disease without esophagitis 04/08/2023   Screening examination for STD (sexually transmitted disease) 03/17/2023   Candidiasis 03/17/2023   Class 1 obesity due to excess calories with body mass index (BMI) of 30.0 to 30.9 in adult 03/17/2023   Symptomatic mammary hypertrophy 02/03/2023   Back pain 02/03/2023   Cerumen debris on tympanic membrane, right 01/20/2023   Encounter for gynecological examination without abnormal finding 01/14/2023   Sensation of plugged ear on right side 01/14/2023   ADHD (attention deficit hyperactivity disorder), combined type 11/20/2022   Anxiety 11/20/2022   Smoking addiction 11/20/2022   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   Tobacco abuse 10/20/2022   Impaired memory 10/20/2022   Class 1 obesity due to excess calories with body mass index (BMI) of 31.0 to 31.9 in adult 10/20/2022   Heart murmur 10/14/2022   Frequent headaches 10/14/2022   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  Rationale for Evaluation and Treatment: Rehabilitation  ONSET DATE: 4 weeks  SUBJECTIVE:   SUBJECTIVE STATEMENT:   PERTINENT HISTORY: Melinda Padilla is a 33 year old female with flat feet who presents with medial ankle pain. She was referred by [facility] for evaluation of her ankle pain.   Two weeks ago, she began experiencing pain in the medial aspect of her ankle, specifically behind the medial malleolus. The area is tender, especially when walking, and the pain is localized without symptoms on the lateral side of the ankle. She has flat feet and uses orthotics for support. She engages in physical therapy exercises, including those involving towels, to strengthen the tendon. She recalls hitting her ankle, which caused temporary coldness and pain in her toes, resolving within twenty minutes after sitting down. She noticed an increased pulling sensation in the area following the incident. PAIN:  Are you having pain? Yes: NPRS scale: *** Pain location: *** Pain description: *** Aggravating factors: *** Relieving factors: ***  PRECAUTIONS: None  RED FLAGS: None   WEIGHT BEARING RESTRICTIONS: No  FALLS:  Has patient fallen in last 6 months? No  OCCUPATION: ***  PLOF: Independent  PATIENT GOALS: ***  NEXT MD VISIT: TBD  OBJECTIVE:  Note: Objective measures were completed at Evaluation unless otherwise noted.  DIAGNOSTIC FINDINGS: Ankle X-ray: Internal fixation with plate and screws in place, no displacement, symptoms on medial side, appears satisfactory (12/22/2023)  PATIENT SURVEYS:  LEFS  Extreme difficulty/unable (0), Quite a bit of difficulty (1), Moderate difficulty (2), Little difficulty (3), No  difficulty (4) Survey date:    Any of your usual work, housework or school activities   2. Usual hobbies, recreational or sporting activities   3. Getting into/out of the bath   4.  Walking between rooms   5. Putting on socks/shoes   6. Squatting    7. Lifting an object, like a bag of groceries from the floor   8. Performing light activities around your home   9. Performing heavy activities around your home   10. Getting into/out of a car   11. Walking 2 blocks   12. Walking 1 mile   13. Going up/down 10 stairs (1 flight)   14. Standing for 1 hour   15.  sitting for 1 hour   16. Running on even ground   17. Running on uneven ground   18. Making sharp turns while running fast   19. Hopping    20. Rolling over in bed   Score total:  ***    MUSCLE LENGTH: Hamstrings: Right *** deg; Left *** deg Debby test: Right *** deg; Left *** deg  POSTURE: {posture:25561}  PALPATION: ***  LOWER EXTREMITY ROM:  {AROM/PROM:27142} ROM Right eval Left eval  Hip flexion    Hip extension    Hip abduction    Hip adduction    Hip internal rotation    Hip external rotation    Knee flexion    Knee extension    Ankle dorsiflexion    Ankle plantarflexion    Ankle inversion    Ankle eversion     (Blank rows = not tested)  LOWER EXTREMITY MMT:  MMT Right eval Left eval  Hip flexion    Hip extension    Hip abduction    Hip adduction    Hip internal rotation    Hip external rotation    Knee flexion    Knee extension    Ankle dorsiflexion    Ankle plantarflexion    Ankle inversion    Ankle eversion     (Blank rows = not tested)  LOWER EXTREMITY SPECIAL TESTS:  Ankle special tests: Anterior drawer test: {pos/neg:25230} and Talar tilt test: {pos/neg:25230}  FUNCTIONAL TESTS:  30 seconds chair stand test  GAIT: Distance walked: *** Assistive device utilized: {Assistive devices:23999} Level of assistance: {Levels of assistance:24026} Comments: ***                                                                                                                                TREATMENT DATE: ***    PATIENT EDUCATION:  Education details: Discussed  eval findings, rehab rationale and POC and patient is in agreement  Person educated: Patient Education method: Explanation and Handouts Education comprehension: verbalized understanding and needs further education  HOME EXERCISE PROGRAM: ***  ASSESSMENT:  CLINICAL IMPRESSION: Patient is a *** y.o. *** who was seen today for physical therapy evaluation and treatment for ***.   OBJECTIVE IMPAIRMENTS: {opptimpairments:25111}.  ACTIVITY LIMITATIONS: {activitylimitations:27494}  PARTICIPATION LIMITATIONS: {participationrestrictions:25113}  PERSONAL FACTORS: {Personal factors:25162} are also affecting patient's functional outcome.   REHAB POTENTIAL: Good  CLINICAL DECISION MAKING: Stable/uncomplicated  EVALUATION COMPLEXITY: Moderate   GOALS: Goals reviewed with patient? No  SHORT TERM GOALS: Target date: *** Patient to demonstrate independence in HEP  Baseline: Goal status: INITIAL  2.  *** Baseline:  Goal status: INITIAL  3.  *** Baseline:  Goal status: INITIAL  4.  *** Baseline:  Goal status: INITIAL  5.  *** Baseline:  Goal status: INITIAL  6.  *** Baseline:  Goal status: INITIAL  LONG TERM GOALS: Target date: ***  Patient will acknowledge ***/10 pain at least once during episode of care   Baseline:  Goal status: INITIAL  2.  Patient will score at least ***% on FOTO to signify clinically meaningful improvement in functional abilities.   Baseline:  Goal status: INITIAL  3.  Patient will increase 30s chair stand reps from *** to *** with/without arms to demonstrate and improved functional ability with less pain/difficulty as well as reduce fall risk.  Baseline:  Goal status: INITIAL  4.  *** Baseline:  Goal status: INITIAL  5.  *** Baseline:  Goal status: INITIAL  6.  *** Baseline:  Goal status: INITIAL   PLAN:  PT FREQUENCY: 1-2x/week  PT DURATION: 6 weeks  PLANNED INTERVENTIONS: 97110-Therapeutic exercises, 97530- Therapeutic  activity, 97112- Neuromuscular re-education, 97535- Self Care, 02859- Manual therapy, [phone]- Gait training, Patient/Family education, Balance training, and Stair training  PLAN FOR NEXT SESSION: HEP review and update, manual techniques as appropriate, aerobic tasks, ROM and flexibility activities, strengthening and PREs, TPDN, gait and balance training as needed     Reyes CHRISTELLA Kohut, PT 01/19/2024, 1:51 PM

## 2024-01-21 ENCOUNTER — Ambulatory Visit

## 2024-02-03 NOTE — Therapy (Signed)
 OUTPATIENT PHYSICAL THERAPY LOWER EXTREMITY EVALUATION   Patient Name: Melinda Padilla MRN: [id number] DOB:[birth year], 33 y.o., female Today's Date: 02/05/2024  END OF SESSION:  PT End of Session - 02/04/24 1742     Visit Number 1    Number of Visits 12    Date for Recertification  04/05/24    Authorization Type MCD    PT Start Time 1700    PT Stop Time 1745    PT Time Calculation (min) 45 min    Activity Tolerance Patient tolerated treatment well    Behavior During Therapy WFL for tasks assessed/performed          Past Medical History:  Diagnosis Date   Heart murmur    Past Surgical History:  Procedure Laterality Date   NO PAST SURGERIES     ORIF ANKLE FRACTURE Right 11/26/2020   Procedure: OPEN REDUCTION INTERNAL FIXATION (ORIF) RIGHT LATERAL MALLEOLUS;  Surgeon: Jerri Kay HERO, MD;  Location: [facility];  Service: Orthopedics;  Laterality: Right;   Patient Active Problem List   Diagnosis Date Noted   Nausea and vomiting in adult 04/08/2023   Gastroesophageal reflux disease without esophagitis 04/08/2023   Screening examination for STD (sexually transmitted disease) 03/17/2023   Candidiasis 03/17/2023   Class 1 obesity due to excess calories with body mass index (BMI) of 30.0 to 30.9 in adult 03/17/2023   Symptomatic mammary hypertrophy 02/03/2023   Back pain 02/03/2023   Cerumen debris on tympanic membrane, right 01/20/2023   Encounter for gynecological examination without abnormal finding 01/14/2023   Sensation of plugged ear on right side 01/14/2023   ADHD (attention deficit hyperactivity disorder), combined type 11/20/2022   Anxiety 11/20/2022   Smoking addiction 11/20/2022   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   Tobacco abuse 10/20/2022   Impaired memory 10/20/2022   Class 1 obesity due to excess calories with body mass index (BMI) of 31.0 to 31.9 in adult 10/20/2022   Heart murmur 10/14/2022   Frequent headaches  10/14/2022   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   Recurrent epistaxis 08/19/2022   Displaced fracture of lateral malleolus of right fibula, initial encounter for closed fracture     PCP: Petrina Pries, NP   REFERRING PROVIDER: Jerri Kay HERO, MD  REFERRING DIAG: [phone] (ICD-10-CM) - Acute right ankle pain M76.821 (ICD-10-CM) - Posterior tibial tendon dysfunction (PTTD) of right lower extremity  THERAPY DIAG:  Pain in right ankle and joints of right foot  Posterior tibial tendinitis of right lower extremity  Muscle weakness (generalized)  Rationale for Evaluation and Treatment: Rehabilitation  ONSET DATE: 4 weeks  SUBJECTIVE:   SUBJECTIVE STATEMENT:  Patient describes 2 month history of R medial ankle pain.  R ankle ORIF 2022 with hardware intact.  Symptoms along medial aspect of ankle   PERTINENT HISTORY: Melinda Padilla is a 33 year old female with flat feet who presents with medial ankle pain. She was referred by [facility] for evaluation of her ankle pain.   Two weeks ago, she began experiencing pain in the medial aspect of her ankle, specifically behind the medial malleolus. The area is tender, especially when walking, and the pain is localized without symptoms on the lateral side of the ankle. She has flat feet and uses orthotics for support. She engages in physical therapy exercises, including those involving towels, to strengthen the tendon. She recalls hitting her ankle, which caused temporary coldness and pain in her toes, resolving within twenty minutes after sitting down. She  noticed an increased pulling sensation in the area following the incident. PAIN:  Are you having pain? Yes: NPRS scale: 5/10 Pain location: medial R ankle Pain description: ache, sore Aggravating factors: prolonged positions Relieving factors: position change  PRECAUTIONS: None  RED FLAGS: None   WEIGHT BEARING RESTRICTIONS: No  FALLS:  Has patient fallen in last 6 months?  No  OCCUPATION: custodian  PLOF: Independent  PATIENT GOALS: To manage my ankle symptoms  NEXT MD VISIT: TBD  OBJECTIVE:  Note: Objective measures were completed at Evaluation unless otherwise noted.  DIAGNOSTIC FINDINGS: Ankle X-ray: Internal fixation with plate and screws in place, no displacement, symptoms on medial side, appears satisfactory (12/22/2023) X-ray of the right ankle show prior plate and screw construct from ORIF  without any complications.  There is degenerative spurring of the ankle  joint and of the midfoot.  No acute abnormalities.   PATIENT SURVEYS:  LEFS 41/80    MUSCLE LENGTH: deferred  POSTURE: pes planus R with ER  PALPATION: deferred  LOWER EXTREMITY ROM:  Active ROM Right eval Left eval  Hip flexion    Hip extension    Hip abduction    Hip adduction    Hip internal rotation    Hip external rotation    Knee flexion    Knee extension    Ankle dorsiflexion 0/3d   Ankle plantarflexion    Ankle inversion    Ankle eversion     (Blank rows = not tested)  LOWER EXTREMITY MMT:  MMT Right eval Left eval  Hip flexion    Hip extension    Hip abduction    Hip adduction    Hip internal rotation    Hip external rotation    Knee flexion    Knee extension    Ankle dorsiflexion 4-   Ankle plantarflexion 3   Ankle inversion    Ankle eversion     (Blank rows = not tested)  LOWER EXTREMITY SPECIAL TESTS:  Ankle special tests: Tinel's test-Posterior tibialis: positive   FUNCTIONAL TESTS:  30 seconds chair stand test 7  SLS 30s L, 10s R  GAIT: Distance walked: 34ftx2 Assistive device utilized: None Level of assistance: Complete Independence Comments: unremarkable                                                                                                                                TREATMENT: [facility] Adult PT Treatment:                                                DATE: 02/04/24  Self Care: Additional minutes spent for  educating on updated Therapeutic Home Exercise Program as well as comparing current status to condition at start of symptoms. This included exercises focusing on stretching, strengthening, with focus on eccentric aspects.  Long term goals include an improvement in range of motion, strength, endurance as well as avoiding reinjury. Patient's frequency would include in 1-2 times a day, 3-5 times a week for a duration of 6-12 weeks. Proper technique shown and discussed handout in great detail. All questions were discussed and addressed.     PATIENT EDUCATION:  Education details: Discussed eval findings, rehab rationale and POC and patient is in agreement  Person educated: Patient Education method: Explanation and Handouts Education comprehension: verbalized understanding and needs further education  HOME EXERCISE PROGRAM: Access Code: [email or web address] URL: [email or web address] Date: 02/04/2024 Prepared by: Reyes Kohut  Exercises - Long Sitting Plantar Fascia Stretch with Towel  - 2 x daily - 5 x weekly - 1 sets - 2 reps - 60s hold - Gastroc Stretch on Wall  - 2 x daily - 5 x weekly - 1 sets - 2 reps - 30s hold - Soleus Stretch on Wall  - 2 x daily - 5 x weekly - 1 sets - 2 reps - 30s hold - Single Leg Stance  - 2 x daily - 5 x weekly - 1 sets - 2 reps - 30s hold  ASSESSMENT:  CLINICAL IMPRESSION: Patient is a 33 y.o. female who was seen today for physical therapy evaluation and treatment for R ankle pain.  Patient presents with decreased ROM into DF, INV/EV functional with hardware in place.  Strength and balance deficits identified.  R toe out noted due to pesplanus and hip tightness, mild discomfort with palpation of posterior tibialis tendon.  OBJECTIVE IMPAIRMENTS: Abnormal gait, decreased balance, decreased knowledge of condition, decreased mobility, decreased ROM, decreased strength, and pain.   ACTIVITY LIMITATIONS: carrying, lifting, sitting, standing, squatting, and  stairs  PERSONAL FACTORS: Age, Past/current experiences, and Time since onset of injury/illness/exacerbation are also affecting patient's functional outcome.   REHAB POTENTIAL: Good  CLINICAL DECISION MAKING: Stable/uncomplicated  EVALUATION COMPLEXITY: Moderate   GOALS: Goals reviewed with patient? No  SHORT TERM GOALS: Target date: 02/25/2024   Patient to demonstrate independence in HEP  Baseline: Access Code: [email or web address] Goal status: INITIAL  2.  15s R SLS  Baseline: 10s Goal status: INITIAL    LONG TERM GOALS: Target date: 03/31/2024    Patient will acknowledge 2/10 pain at least once during episode of care   Baseline: 5/10 Goal status: INITIAL  2.  Patient will score at least 60/80 on FOTO to signify clinically meaningful improvement in functional abilities.   Baseline: 41/80 Goal status: INITIAL  3.  Patient will increase 30s chair stand reps from 7 to 10 with/without arms to demonstrate and improved functional ability with less pain/difficulty as well as reduce fall risk.  Baseline: 7 Goal status: INITIAL  4.  Increase AROM DF to 8d Baseline: 0d Goal status: INITIAL  5.  Increase R ankle strength to 4/5 Baseline:  Ankle dorsiflexion 4-   Ankle plantarflexion 3    Goal status: INITIAL    PLAN:  PT FREQUENCY: 1-2x/week  PT DURATION: 6 weeks  PLANNED INTERVENTIONS: 97110-Therapeutic exercises, 97530- Therapeutic activity, 97112- Neuromuscular re-education, 97535- Self Care, [id number]- Manual therapy, [id number]- Gait training, Patient/Family education, Balance training, and Stair training  PLAN FOR NEXT SESSION: HEP review and update, manual techniques as appropriate, aerobic tasks, ROM and flexibility activities, strengthening and PREs, TPDN, gait and balance training as needed   For all possible CPT codes, reference the Planned Interventions line above.     Check all conditions that are expected to impact treatment: {Conditions expected to  impact  treatment:Structural or anatomic abnormalities and Complications related to surgery   If treatment provided at initial evaluation, no treatment charged due to lack of authorization.       Osmar Howton M Shamarra Warda, PT 02/05/2024, 9:32 AM

## 2024-02-04 ENCOUNTER — Other Ambulatory Visit: Payer: Self-pay

## 2024-02-04 ENCOUNTER — Ambulatory Visit: Attending: Orthopaedic Surgery

## 2024-02-04 DIAGNOSIS — M76821 Posterior tibial tendinitis, right leg: Secondary | ICD-10-CM | POA: Insufficient documentation

## 2024-02-04 DIAGNOSIS — M6281 Muscle weakness (generalized): Secondary | ICD-10-CM | POA: Insufficient documentation

## 2024-02-04 DIAGNOSIS — M25571 Pain in right ankle and joints of right foot: Secondary | ICD-10-CM | POA: Diagnosis present

## 2024-02-09 ENCOUNTER — Other Ambulatory Visit (HOSPITAL_COMMUNITY): Payer: Self-pay

## 2024-02-09 MED ORDER — HYDROCODONE-ACETAMINOPHEN 5-325 MG PO TABS
1.0000 | ORAL_TABLET | ORAL | 0 refills | Status: DC | PRN
  Filled 2024-02-09: qty 12, 2d supply, fill #0

## 2024-02-09 MED ORDER — IBUPROFEN 800 MG PO TABS
800.0000 mg | ORAL_TABLET | Freq: Three times a day (TID) | ORAL | 1 refills | Status: DC | PRN
  Filled 2024-02-09: qty 12, 4d supply, fill #0
  Filled 2024-02-15: qty 12, 4d supply, fill #1

## 2024-02-15 ENCOUNTER — Other Ambulatory Visit (HOSPITAL_COMMUNITY): Payer: Self-pay

## 2024-02-21 NOTE — Therapy (Deleted)
 OUTPATIENT PHYSICAL THERAPY LOWER EXTREMITY EVALUATION   Patient Name: Melinda Padilla MRN: [id number] DOB:01/10/1991, 33 y.o., female Today's Date: 02/21/2024  END OF SESSION:    Past Medical History:  Diagnosis Date   Heart murmur    Past Surgical History:  Procedure Laterality Date   NO PAST SURGERIES     ORIF ANKLE FRACTURE Right 11/26/2020   Procedure: OPEN REDUCTION INTERNAL FIXATION (ORIF) RIGHT LATERAL MALLEOLUS;  Surgeon: Jerri Kay HERO, MD;  Location: [facility];  Service: Orthopedics;  Laterality: Right;   Patient Active Problem List   Diagnosis Date Noted   Nausea and vomiting in adult 04/08/2023   Gastroesophageal reflux disease without esophagitis 04/08/2023   Screening examination for STD (sexually transmitted disease) 03/17/2023   Candidiasis 03/17/2023   Class 1 obesity due to excess calories with body mass index (BMI) of 30.0 to 30.9 in adult 03/17/2023   Symptomatic mammary hypertrophy 02/03/2023   Back pain 02/03/2023   Cerumen debris on tympanic membrane, right 01/20/2023   Encounter for gynecological examination without abnormal finding 01/14/2023   Sensation of plugged ear on right side 01/14/2023   ADHD (attention deficit hyperactivity disorder), combined type 11/20/2022   Anxiety 11/20/2022   Smoking addiction 11/20/2022   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   Tobacco abuse 10/20/2022   Impaired memory 10/20/2022   Class 1 obesity due to excess calories with body mass index (BMI) of 31.0 to 31.9 in adult 10/20/2022   Heart murmur 10/14/2022   Frequent headaches 10/14/2022   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  Rationale for Evaluation and Treatment: Rehabilitation  ONSET DATE: 4 weeks  SUBJECTIVE:   SUBJECTIVE STATEMENT:  Patient describes 2 month history of R medial ankle pain.  R ankle ORIF 2022 with hardware intact.  Symptoms along medial aspect of ankle   PERTINENT HISTORY: Melinda Padilla is a 33 year old female with flat feet who presents with medial ankle pain. She was referred by [facility] for evaluation of her ankle pain.   Two weeks ago, she began experiencing pain in the medial aspect of her ankle, specifically behind the medial malleolus. The area is tender, especially when walking, and the pain is localized without symptoms on the lateral side of the ankle. She has flat feet and uses orthotics for support. She engages in physical therapy exercises, including those involving towels, to strengthen the tendon. She recalls hitting her ankle, which caused temporary coldness and pain in her toes, resolving within twenty minutes after sitting down. She noticed an increased pulling sensation in the area following the incident. PAIN:  Are you having pain? Yes: NPRS scale: 5/10 Pain location: medial R ankle Pain description: ache, sore Aggravating factors: prolonged positions Relieving factors: position change  PRECAUTIONS: None  RED FLAGS: None   WEIGHT BEARING RESTRICTIONS: No  FALLS:  Has patient fallen in last 6 months? No  OCCUPATION: custodian  PLOF: Independent  PATIENT GOALS: To manage my ankle symptoms  NEXT MD VISIT: TBD  OBJECTIVE:  Note: Objective measures were completed at Evaluation unless otherwise noted.  DIAGNOSTIC FINDINGS: Ankle X-ray: Internal fixation with  plate and screws in place, no displacement, symptoms on medial side, appears satisfactory (12/22/2023) X-ray of the right ankle show prior plate and screw construct from ORIF  without any complications.  There is  degenerative spurring of the ankle  joint and of the midfoot.  No acute abnormalities.   PATIENT SURVEYS:  LEFS 41/80    MUSCLE LENGTH: deferred  POSTURE: pes planus R with ER  PALPATION: deferred  LOWER EXTREMITY ROM:  Active ROM Right eval Left eval  Hip flexion    Hip extension    Hip abduction    Hip adduction    Hip internal rotation    Hip external rotation    Knee flexion    Knee extension    Ankle dorsiflexion 0/3d   Ankle plantarflexion    Ankle inversion    Ankle eversion     (Blank rows = not tested)  LOWER EXTREMITY MMT:  MMT Right eval Left eval  Hip flexion    Hip extension    Hip abduction    Hip adduction    Hip internal rotation    Hip external rotation    Knee flexion    Knee extension    Ankle dorsiflexion 4-   Ankle plantarflexion 3   Ankle inversion    Ankle eversion     (Blank rows = not tested)  LOWER EXTREMITY SPECIAL TESTS:  Ankle special tests: Tinel's test-Posterior tibialis: positive   FUNCTIONAL TESTS:  30 seconds chair stand test 7  SLS 30s L, 10s R  GAIT: Distance walked: 53ftx2 Assistive device utilized: None Level of assistance: Complete Independence Comments: unremarkable                                                                                                                                TREATMENT: [facility] Adult PT Treatment:                                                DATE: 02/04/24  Self Care: Additional minutes spent for educating on updated Therapeutic Home Exercise Program as well as comparing current status to condition at start of symptoms. This included exercises focusing on stretching, strengthening, with focus on eccentric aspects. Long term goals include an improvement in range of motion, strength, endurance as well as avoiding reinjury. Patient's frequency would include in 1-2 times a day, 3-5 times a week for a duration of 6-12 weeks. Proper technique shown and discussed handout in great  detail. All questions were discussed and addressed.     PATIENT EDUCATION:  Education details: Discussed eval findings, rehab rationale and POC and patient is in agreement  Person educated: Patient Education method: Explanation and Handouts Education comprehension: verbalized understanding and needs further education  HOME EXERCISE PROGRAM: Access Code: [email or web address] URL: [email or web address] Date: 02/04/2024 Prepared by:  Reyes Kohut  Exercises - Long Sitting Plantar Fascia Stretch with Towel  - 2 x daily - 5 x weekly - 1 sets - 2 reps - 60s hold - Gastroc Stretch on Wall  - 2 x daily - 5 x weekly - 1 sets - 2 reps - 30s hold - Soleus Stretch on Wall  - 2 x daily - 5 x weekly - 1 sets - 2 reps - 30s hold - Single Leg Stance  - 2 x daily - 5 x weekly - 1 sets - 2 reps - 30s hold  ASSESSMENT:  CLINICAL IMPRESSION: Patient is a 33 y.o. female who was seen today for physical therapy evaluation and treatment for R ankle pain.  Patient presents with decreased ROM into DF, INV/EV functional with hardware in place.  Strength and balance deficits identified.  R toe out noted due to pesplanus and hip tightness, mild discomfort with palpation of posterior tibialis tendon.  OBJECTIVE IMPAIRMENTS: Abnormal gait, decreased balance, decreased knowledge of condition, decreased mobility, decreased ROM, decreased strength, and pain.   ACTIVITY LIMITATIONS: carrying, lifting, sitting, standing, squatting, and stairs  PERSONAL FACTORS: Age, Past/current experiences, and Time since onset of injury/illness/exacerbation are also affecting patient's functional outcome.   REHAB POTENTIAL: Good  CLINICAL DECISION MAKING: Stable/uncomplicated  EVALUATION COMPLEXITY: Moderate   GOALS: Goals reviewed with patient? No  SHORT TERM GOALS: Target date: 02/25/2024   Patient to demonstrate independence in HEP  Baseline: Access Code: [email or web address] Goal status: INITIAL  2.  15s R SLS  Baseline:  10s Goal status: INITIAL    LONG TERM GOALS: Target date: 03/31/2024    Patient will acknowledge 2/10 pain at least once during episode of care   Baseline: 5/10 Goal status: INITIAL  2.  Patient will score at least 60/80 on FOTO to signify clinically meaningful improvement in functional abilities.   Baseline: 41/80 Goal status: INITIAL  3.  Patient will increase 30s chair stand reps from 7 to 10 with/without arms to demonstrate and improved functional ability with less pain/difficulty as well as reduce fall risk.  Baseline: 7 Goal status: INITIAL  4.  Increase AROM DF to 8d Baseline: 0d Goal status: INITIAL  5.  Increase R ankle strength to 4/5 Baseline:  Ankle dorsiflexion 4-   Ankle plantarflexion 3    Goal status: INITIAL    PLAN:  PT FREQUENCY: 1-2x/week  PT DURATION: 6 weeks  PLANNED INTERVENTIONS: 97110-Therapeutic exercises, 97530- Therapeutic activity, 97112- Neuromuscular re-education, 97535- Self Care, 02859- Manual therapy, [phone]- Gait training, Patient/Family education, Balance training, and Stair training  PLAN FOR NEXT SESSION: HEP review and update, manual techniques as appropriate, aerobic tasks, ROM and flexibility activities, strengthening and PREs, TPDN, gait and balance training as needed   For all possible CPT codes, reference the Planned Interventions line above.     Check all conditions that are expected to impact treatment: {Conditions expected to impact treatment:Structural or anatomic abnormalities and Complications related to surgery   If treatment provided at initial evaluation, no treatment charged due to lack of authorization.       Korayma Hagwood M Mohini Heathcock, PT 02/21/2024, 2:27 PM

## 2024-02-22 ENCOUNTER — Telehealth: Payer: Self-pay

## 2024-02-22 ENCOUNTER — Encounter: Payer: Self-pay | Admitting: Radiology

## 2024-02-22 ENCOUNTER — Ambulatory Visit: Attending: Orthopaedic Surgery

## 2024-02-22 DIAGNOSIS — M76821 Posterior tibial tendinitis, right leg: Secondary | ICD-10-CM | POA: Insufficient documentation

## 2024-02-22 DIAGNOSIS — M6281 Muscle weakness (generalized): Secondary | ICD-10-CM | POA: Insufficient documentation

## 2024-02-22 DIAGNOSIS — M25571 Pain in right ankle and joints of right foot: Secondary | ICD-10-CM | POA: Insufficient documentation

## 2024-02-22 NOTE — Telephone Encounter (Signed)
 TC due to missed visit.  Spoke directly to patient who forgot appointment.  Reminded of next scheduled visit as well as attendance policy.

## 2024-02-29 NOTE — Therapy (Unsigned)
 OUTPATIENT PHYSICAL THERAPY LOWER EXTREMITY EVALUATION   Patient Name: Melinda Padilla MRN: [id number] DOB:02-20-1991, 33 y.o., female Today's Date: 03/01/2024  END OF SESSION:  PT End of Session - 03/01/24 1757     Visit Number 2    Number of Visits 12    Date for Recertification  04/05/24    Authorization Type MCD    Authorization Time Period approved 6 PT visits from 02/23/24-04/05/24    Authorization - Visit Number 2    Authorization - Number of Visits 6    PT Start Time 1750    PT Stop Time 1830    PT Time Calculation (min) 40 min    Activity Tolerance Patient tolerated treatment well    Behavior During Therapy [facility] for tasks assessed/performed           Past Medical History:  Diagnosis Date   Heart murmur    Past Surgical History:  Procedure Laterality Date   NO PAST SURGERIES     ORIF ANKLE FRACTURE Right 11/26/2020   Procedure: OPEN REDUCTION INTERNAL FIXATION (ORIF) RIGHT LATERAL MALLEOLUS;  Surgeon: Jerri Kay HERO, MD;  Location: [facility];  Service: Orthopedics;  Laterality: Right;   Patient Active Problem List   Diagnosis Date Noted   Nausea and vomiting in adult 04/08/2023   Gastroesophageal reflux disease without esophagitis 04/08/2023   Screening examination for STD (sexually transmitted disease) 03/17/2023   Candidiasis 03/17/2023   Class 1 obesity due to excess calories with body mass index (BMI) of 30.0 to 30.9 in adult 03/17/2023   Symptomatic mammary hypertrophy 02/03/2023   Back pain 02/03/2023   Cerumen debris on tympanic membrane, right 01/20/2023   Encounter for gynecological examination without abnormal finding 01/14/2023   Sensation of plugged ear on right side 01/14/2023   ADHD (attention deficit hyperactivity disorder), combined type 11/20/2022   Anxiety 11/20/2022   Smoking addiction 11/20/2022   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   Tobacco abuse 10/20/2022   Impaired memory 10/20/2022   Class  1 obesity due to excess calories with body mass index (BMI) of 31.0 to 31.9 in adult 10/20/2022   Heart murmur 10/14/2022   Frequent headaches 10/14/2022   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   Recurrent epistaxis 08/19/2022   Displaced fracture of lateral malleolus of right fibula, initial encounter for closed fracture     PCP: Petrina Pries, NP   REFERRING PROVIDER: Jerri Kay HERO, MD  REFERRING DIAG: [phone] (ICD-10-CM) - Acute right ankle pain M76.821 (ICD-10-CM) - Posterior tibial tendon dysfunction (PTTD) of right lower extremity  THERAPY DIAG:  Pain in right ankle and joints of right foot  Posterior tibial tendinitis of right lower extremity  Muscle weakness (generalized)  Rationale for Evaluation and Treatment: Rehabilitation  ONSET DATE: 4 weeks  SUBJECTIVE:   SUBJECTIVE STATEMENT:  Patient describes 2 month history of R medial ankle pain.  R ankle ORIF 2022 with hardware intact.  Symptoms along medial aspect of ankle   PERTINENT HISTORY: Melinda Padilla is a 33 year old female with flat feet who presents with medial ankle pain. She was referred by [facility] for evaluation of her ankle pain.   Two weeks ago, she began experiencing pain in the medial aspect of her ankle, specifically behind the medial malleolus. The area is tender, especially when walking, and the pain is localized without symptoms on the lateral side of the ankle. She has flat feet and uses orthotics for support. She engages in physical therapy exercises,  including those involving towels, to strengthen the tendon. She recalls hitting her ankle, which caused temporary coldness and pain in her toes, resolving within twenty minutes after sitting down. She noticed an increased pulling sensation in the area following the incident. PAIN:  Are you having pain? Yes: NPRS scale: 5/10 Pain location: medial R ankle Pain description: ache, sore Aggravating factors: prolonged positions Relieving factors: position  change  PRECAUTIONS: None  RED FLAGS: None   WEIGHT BEARING RESTRICTIONS: No  FALLS:  Has patient fallen in last 6 months? No  OCCUPATION: custodian  PLOF: Independent  PATIENT GOALS: To manage my ankle symptoms  NEXT MD VISIT: TBD  OBJECTIVE:  Note: Objective measures were completed at Evaluation unless otherwise noted.  DIAGNOSTIC FINDINGS: Ankle X-ray: Internal fixation with plate and screws in place, no displacement, symptoms on medial side, appears satisfactory (12/22/2023) X-ray of the right ankle show prior plate and screw construct from ORIF  without any complications.  There is degenerative spurring of the ankle  joint and of the midfoot.  No acute abnormalities.   PATIENT SURVEYS:  LEFS 41/80    MUSCLE LENGTH: deferred  POSTURE: pes planus R with ER  PALPATION: deferred  LOWER EXTREMITY ROM:  Active ROM Right eval Left eval  Hip flexion    Hip extension    Hip abduction    Hip adduction    Hip internal rotation    Hip external rotation    Knee flexion    Knee extension    Ankle dorsiflexion 0/3d   Ankle plantarflexion    Ankle inversion    Ankle eversion     (Blank rows = not tested)  LOWER EXTREMITY MMT:  MMT Right eval Left eval  Hip flexion    Hip extension    Hip abduction    Hip adduction    Hip internal rotation    Hip external rotation    Knee flexion    Knee extension    Ankle dorsiflexion 4-   Ankle plantarflexion 3   Ankle inversion    Ankle eversion     (Blank rows = not tested)  LOWER EXTREMITY SPECIAL TESTS:  Ankle special tests: Tinel's test-Posterior tibialis: positive   FUNCTIONAL TESTS:  30 seconds chair stand test 7  SLS 30s L, 10s R  GAIT: Distance walked: 13ftx2 Assistive device utilized: None Level of assistance: Complete Independence Comments: unremarkable                                                                                                                                 TREATMENT: [facility] Adult PT Treatment:                                                DATE: 03/01/24 Therapeutic Exercise: Nustep L4 8 min Neuromuscular re-ed:  Standing gastroc stertch w/supination bias 60s x2  B heel raise with tennis ball 15x2 Therapeutic Activity: Glute medius hip hike 15x B L hip flexion and R trunk rotation against wall 15x Eversion mobs in prone  [facility] Adult PT Treatment:                                                DATE: 02/04/24  Self Care: Additional minutes spent for educating on updated Therapeutic Home Exercise Program as well as comparing current status to condition at start of symptoms. This included exercises focusing on stretching, strengthening, with focus on eccentric aspects. Long term goals include an improvement in range of motion, strength, endurance as well as avoiding reinjury. Patient's frequency would include in 1-2 times a day, 3-5 times a week for a duration of 6-12 weeks. Proper technique shown and discussed handout in great detail. All questions were discussed and addressed.     PATIENT EDUCATION:  Education details: Discussed eval findings, rehab rationale and POC and patient is in agreement  Person educated: Patient Education method: Explanation and Handouts Education comprehension: verbalized understanding and needs further education  HOME EXERCISE PROGRAM: Access Code: [email or web address] URL: [email or web address] Date: 02/04/2024 Prepared by: Reyes Kohut  Exercises - Long Sitting Plantar Fascia Stretch with Towel  - 2 x daily - 5 x weekly - 1 sets - 2 reps - 60s hold - Gastroc Stretch on Wall  - 2 x daily - 5 x weekly - 1 sets - 2 reps - 30s hold - Soleus Stretch on Wall  - 2 x daily - 5 x weekly - 1 sets - 2 reps - 30s hold - Single Leg Stance  - 2 x daily - 5 x weekly - 1 sets - 2 reps - 30s hold  ASSESSMENT:  CLINICAL IMPRESSION: First f/u session focused on flexibility, HEP review and correcting foot mechanics through  functional stertching, movement patterns and mobs.  Patient is a 33 y.o. female who was seen today for physical therapy evaluation and treatment for R ankle pain.  Patient presents with decreased ROM into DF, INV/EV functional with hardware in place.  Strength and balance deficits identified.  R toe out noted due to pesplanus and hip tightness, mild discomfort with palpation of posterior tibialis tendon.  OBJECTIVE IMPAIRMENTS: Abnormal gait, decreased balance, decreased knowledge of condition, decreased mobility, decreased ROM, decreased strength, and pain.   ACTIVITY LIMITATIONS: carrying, lifting, sitting, standing, squatting, and stairs  PERSONAL FACTORS: Age, Past/current experiences, and Time since onset of injury/illness/exacerbation are also affecting patient's functional outcome.   REHAB POTENTIAL: Good  CLINICAL DECISION MAKING: Stable/uncomplicated  EVALUATION COMPLEXITY: Moderate   GOALS: Goals reviewed with patient? No  SHORT TERM GOALS: Target date: 02/25/2024   Patient to demonstrate independence in HEP  Baseline: Access Code: [email or web address] Goal status: INITIAL  2.  15s R SLS  Baseline: 10s Goal status: INITIAL    LONG TERM GOALS: Target date: 03/31/2024    Patient will acknowledge 2/10 pain at least once during episode of care   Baseline: 5/10 Goal status: INITIAL  2.  Patient will score at least 60/80 on FOTO to signify clinically meaningful improvement in functional abilities.   Baseline: 41/80 Goal status: INITIAL  3.  Patient will increase 30s chair stand reps from 7 to 10 with/without arms to demonstrate and improved functional ability with less  pain/difficulty as well as reduce fall risk.  Baseline: 7 Goal status: INITIAL  4.  Increase AROM DF to 8d Baseline: 0d Goal status: INITIAL  5.  Increase R ankle strength to 4/5 Baseline:  Ankle dorsiflexion 4-   Ankle plantarflexion 3    Goal status: INITIAL    PLAN:  PT FREQUENCY: 1-2x/week  PT  DURATION: 6 weeks  PLANNED INTERVENTIONS: 97110-Therapeutic exercises, 97530- Therapeutic activity, 97112- Neuromuscular re-education, 97535- Self Care, [id number]- Manual therapy, [phone]- Gait training, Patient/Family education, Balance training, and Stair training  PLAN FOR NEXT SESSION: HEP review and update, manual techniques as appropriate, aerobic tasks, ROM and flexibility activities, strengthening and PREs, TPDN, gait and balance training as needed   For all possible CPT codes, reference the Planned Interventions line above.     Check all conditions that are expected to impact treatment: {Conditions expected to impact treatment:Structural or anatomic abnormalities and Complications related to surgery   If treatment provided at initial evaluation, no treatment charged due to lack of authorization.       Khing Belcher M [name] Barbian, PT 03/01/2024, 6:29 PM

## 2024-03-01 ENCOUNTER — Ambulatory Visit

## 2024-03-01 DIAGNOSIS — M76821 Posterior tibial tendinitis, right leg: Secondary | ICD-10-CM

## 2024-03-01 DIAGNOSIS — M25571 Pain in right ankle and joints of right foot: Secondary | ICD-10-CM | POA: Diagnosis present

## 2024-03-01 DIAGNOSIS — M6281 Muscle weakness (generalized): Secondary | ICD-10-CM | POA: Diagnosis present

## 2024-03-09 ENCOUNTER — Telehealth: Payer: Self-pay

## 2024-03-09 ENCOUNTER — Other Ambulatory Visit (HOSPITAL_COMMUNITY): Payer: Self-pay

## 2024-03-09 NOTE — Telephone Encounter (Signed)
 She was provided rx for gabapentin  at prior visit. Did she ever start this? If having any new neuro symptoms associated with this severe pain, needs more emergent evaluation in ED.

## 2024-03-09 NOTE — Telephone Encounter (Signed)
 Called pt at [phone]. She stopped Gabapentin  300mg  po TID a few weeks ago. States it made her feel funny. Could not provide any further details.  Confirmed pain she is experiencing the same as what she reported in the past to [facility]. Pain more severe in last 2 weeks. Denies any injuries prior to worsening pain. Describes as constant dull pain. Severity differs.  Pt will go to ED for further evaluation. She will call back if they request she have sooner appt at our office after being evaluated.

## 2024-03-09 NOTE — Telephone Encounter (Signed)
 Hello Prescriber!  We are in the process of submitting a prior authorization for your patient for Emgality . We are reaching out for clinical guidance for the following information to complete the request: Plan requiring documentation of clinical benefit since starting therapy.   Thank you! Pharmacy Team

## 2024-03-09 NOTE — Telephone Encounter (Signed)
 Called pt at [phone].  Reports overall, headaches have improved since being on Emgality .   However, she reports that initial spot where she hit head in the past is causing severe pain. She is currently scheduled 04/2024 to see Jessica,NP for follow up. Aware I will send to Harlene to see if she wants to schedule sooner appt and will call her back.

## 2024-03-10 NOTE — Telephone Encounter (Signed)
 Pharmacy Patient Advocate Encounter   Received notification from CoverMyMeds that prior authorization for Emgality  is required/requested.   Insurance verification completed.   The patient is insured through [facility] MEDICAID.   Per test claim: PA required; PA submitted to above mentioned insurance via Latent Key/confirmation #/EOC [id number] Status is pending

## 2024-03-11 ENCOUNTER — Other Ambulatory Visit (HOSPITAL_COMMUNITY): Payer: Self-pay

## 2024-03-11 NOTE — Telephone Encounter (Signed)
 Pharmacy Patient Advocate Encounter  Received notification from [facility] MEDICAID that Prior Authorization for Emgality  has been APPROVED from 03/10/2024 to 03/10/2025. Ran test claim, Copay is $4.00. This test claim was processed through [facility]- copay amounts may vary at other pharmacies due to pharmacy/plan contracts, or as the patient moves through the different stages of their insurance plan.   PA #/Case ID/Reference #: [id number]

## 2024-03-22 ENCOUNTER — Ambulatory Visit

## 2024-03-22 DIAGNOSIS — M6281 Muscle weakness (generalized): Secondary | ICD-10-CM | POA: Diagnosis present

## 2024-03-22 DIAGNOSIS — M76821 Posterior tibial tendinitis, right leg: Secondary | ICD-10-CM | POA: Diagnosis present

## 2024-03-22 DIAGNOSIS — M25571 Pain in right ankle and joints of right foot: Secondary | ICD-10-CM | POA: Diagnosis present

## 2024-03-22 NOTE — Therapy (Signed)
 OUTPATIENT PHYSICAL THERAPY LOWER EXTREMITY EVALUATION   Patient Name: Melinda Padilla MRN: [id number] DOB:1990/12/31, 33 y.o., female Today's Date: 03/22/2024  END OF SESSION:  PT End of Session - 03/22/24 1747     Visit Number 3    Number of Visits 12    Date for Recertification  04/05/24    Authorization Type MCD    Authorization Time Period approved 6 PT visits from 02/23/24-04/05/24    Authorization - Visit Number 3    Authorization - Number of Visits 6    PT Start Time 1750    PT Stop Time 1830    PT Time Calculation (min) 40 min    Activity Tolerance Patient tolerated treatment well    Behavior During Therapy [facility] for tasks assessed/performed            Past Medical History:  Diagnosis Date   Heart murmur    Past Surgical History:  Procedure Laterality Date   NO PAST SURGERIES     ORIF ANKLE FRACTURE Right 11/26/2020   Procedure: OPEN REDUCTION INTERNAL FIXATION (ORIF) RIGHT LATERAL MALLEOLUS;  Surgeon: Jerri Kay HERO, MD;  Location: [facility];  Service: Orthopedics;  Laterality: Right;   Patient Active Problem List   Diagnosis Date Noted   Nausea and vomiting in adult 04/08/2023   Gastroesophageal reflux disease without esophagitis 04/08/2023   Screening examination for STD (sexually transmitted disease) 03/17/2023   Candidiasis 03/17/2023   Class 1 obesity due to excess calories with body mass index (BMI) of 30.0 to 30.9 in adult 03/17/2023   Symptomatic mammary hypertrophy 02/03/2023   Back pain 02/03/2023   Cerumen debris on tympanic membrane, right 01/20/2023   Encounter for gynecological examination without abnormal finding 01/14/2023   Sensation of plugged ear on right side 01/14/2023   ADHD (attention deficit hyperactivity disorder), combined type 11/20/2022   Anxiety 11/20/2022   Smoking addiction 11/20/2022   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   Tobacco abuse 10/20/2022   Impaired memory 10/20/2022    Class 1 obesity due to excess calories with body mass index (BMI) of 31.0 to 31.9 in adult 10/20/2022   Heart murmur 10/14/2022   Frequent headaches 10/14/2022   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   Recurrent epistaxis 08/19/2022   Displaced fracture of lateral malleolus of right fibula, initial encounter for closed fracture     PCP: Petrina Pries, NP   REFERRING PROVIDER: Jerri Kay HERO, MD  REFERRING DIAG: [phone] (ICD-10-CM) - Acute right ankle pain M76.821 (ICD-10-CM) - Posterior tibial tendon dysfunction (PTTD) of right lower extremity  THERAPY DIAG:  Pain in right ankle and joints of right foot  Posterior tibial tendinitis of right lower extremity  Muscle weakness (generalized)  Rationale for Evaluation and Treatment: Rehabilitation  ONSET DATE: 4 weeks  SUBJECTIVE:   SUBJECTIVE STATEMENT:  No change in symptoms to report.     PERTINENT HISTORY: Melinda Padilla is a 33 year old female with flat feet who presents with medial ankle pain. She was referred by [facility] for evaluation of her ankle pain.   Two weeks ago, she began experiencing pain in the medial aspect of her ankle, specifically behind the medial malleolus. The area is tender, especially when walking, and the pain is localized without symptoms on the lateral side of the ankle. She has flat feet and uses orthotics for support. She engages in physical therapy exercises, including those involving towels, to strengthen the tendon. She recalls hitting her ankle, which caused temporary  coldness and pain in her toes, resolving within twenty minutes after sitting down. She noticed an increased pulling sensation in the area following the incident. PAIN:  Are you having pain? Yes: NPRS scale: 5/10 Pain location: medial R ankle Pain description: ache, sore Aggravating factors: prolonged positions Relieving factors: position change  PRECAUTIONS: None  RED FLAGS: None   WEIGHT BEARING RESTRICTIONS: No  FALLS:  Has  patient fallen in last 6 months? No  OCCUPATION: custodian  PLOF: Independent  PATIENT GOALS: To manage my ankle symptoms  NEXT MD VISIT: TBD  OBJECTIVE:  Note: Objective measures were completed at Evaluation unless otherwise noted.  DIAGNOSTIC FINDINGS: Ankle X-ray: Internal fixation with plate and screws in place, no displacement, symptoms on medial side, appears satisfactory (12/22/2023) X-ray of the right ankle show prior plate and screw construct from ORIF  without any complications.  There is degenerative spurring of the ankle  joint and of the midfoot.  No acute abnormalities.   PATIENT SURVEYS:  LEFS 41/80    MUSCLE LENGTH: deferred  POSTURE: pes planus R with ER  PALPATION: deferred  LOWER EXTREMITY ROM:  Active ROM Right eval Left eval  Hip flexion    Hip extension    Hip abduction    Hip adduction    Hip internal rotation    Hip external rotation    Knee flexion    Knee extension    Ankle dorsiflexion 0/3d   Ankle plantarflexion    Ankle inversion    Ankle eversion     (Blank rows = not tested)  LOWER EXTREMITY MMT:  MMT Right eval Left eval  Hip flexion    Hip extension    Hip abduction    Hip adduction    Hip internal rotation    Hip external rotation    Knee flexion    Knee extension    Ankle dorsiflexion 4-   Ankle plantarflexion 3   Ankle inversion    Ankle eversion     (Blank rows = not tested)  LOWER EXTREMITY SPECIAL TESTS:  Ankle special tests: Tinel's test-Posterior tibialis: positive   FUNCTIONAL TESTS:  30 seconds chair stand test 7  SLS 30s L, 10s R  GAIT: Distance walked: 24ftx2 Assistive device utilized: None Level of assistance: Complete Independence Comments: unremarkable                                                                                                                                TREATMENT: [facility] Adult PT Treatment:                                                DATE: 03/22/25 Therapeutic  Exercise: Nustep L4 8 min 60 SPM Neuromuscular re-ed: Standing gastroc stertch w/supination bias 60s x2  B heel raise with tennis ball  15x2 3 way ankle RTB 15x2 Therapeutic Activity: Glute medius hip hike 15x B from airex pad L hip flexion and R trunk rotation against wall 15x2 from airex pad Seated soleus raises 25# DB 50x  [facility] Adult PT Treatment:                                                DATE: 03/01/24 Therapeutic Exercise: Nustep L4 8 min Neuromuscular re-ed: Standing gastroc stertch w/supination bias 60s x2  B heel raise with tennis ball 15x2 Therapeutic Activity: Glute medius hip hike 15x B L hip flexion and R trunk rotation against wall 15x Eversion mobs in prone  [facility] Adult PT Treatment:                                                DATE: 02/04/24  Self Care: Additional minutes spent for educating on updated Therapeutic Home Exercise Program as well as comparing current status to condition at start of symptoms. This included exercises focusing on stretching, strengthening, with focus on eccentric aspects. Long term goals include an improvement in range of motion, strength, endurance as well as avoiding reinjury. Patient's frequency would include in 1-2 times a day, 3-5 times a week for a duration of 6-12 weeks. Proper technique shown and discussed handout in great detail. All questions were discussed and addressed.     PATIENT EDUCATION:  Education details: Discussed eval findings, rehab rationale and POC and patient is in agreement  Person educated: Patient Education method: Explanation and Handouts Education comprehension: verbalized understanding and needs further education  HOME EXERCISE PROGRAM: Access Code: [email or web address] URL: [email or web address] Date: 02/04/2024 Prepared by: Reyes Kohut  Exercises - Long Sitting Plantar Fascia Stretch with Towel  - 2 x daily - 5 x weekly - 1 sets - 2 reps - 60s hold - Gastroc Stretch on Wall  - 2 x daily - 5  x weekly - 1 sets - 2 reps - 30s hold - Soleus Stretch on Wall  - 2 x daily - 5 x weekly - 1 sets - 2 reps - 30s hold - Single Leg Stance  - 2 x daily - 5 x weekly - 1 sets - 2 reps - 30s hold  ASSESSMENT:  CLINICAL IMPRESSION: continued stretch and strength exercise and activity adding resistance and challenge as noted.  Incorporated 4 way ankle against RTB.  Patient is a 33 y.o. female who was seen today for physical therapy evaluation and treatment for R ankle pain.  Patient presents with decreased ROM into DF, INV/EV functional with hardware in place.  Strength and balance deficits identified.  R toe out noted due to pesplanus and hip tightness, mild discomfort with palpation of posterior tibialis tendon.  OBJECTIVE IMPAIRMENTS: Abnormal gait, decreased balance, decreased knowledge of condition, decreased mobility, decreased ROM, decreased strength, and pain.   ACTIVITY LIMITATIONS: carrying, lifting, sitting, standing, squatting, and stairs  PERSONAL FACTORS: Age, Past/current experiences, and Time since onset of injury/illness/exacerbation are also affecting patient's functional outcome.   REHAB POTENTIAL: Good  CLINICAL DECISION MAKING: Stable/uncomplicated  EVALUATION COMPLEXITY: Moderate   GOALS: Goals reviewed with patient? No  SHORT TERM GOALS: Target date: 02/25/2024   Patient to demonstrate independence in HEP  Baseline:  Access Code: [email or web address] Goal status: INITIAL  2.  15s R SLS  Baseline: 10s Goal status: INITIAL    LONG TERM GOALS: Target date: 03/31/2024    Patient will acknowledge 2/10 pain at least once during episode of care   Baseline: 5/10 Goal status: INITIAL  2.  Patient will score at least 60/80 on FOTO to signify clinically meaningful improvement in functional abilities.   Baseline: 41/80 Goal status: INITIAL  3.  Patient will increase 30s chair stand reps from 7 to 10 with/without arms to demonstrate and improved functional ability with less  pain/difficulty as well as reduce fall risk.  Baseline: 7 Goal status: INITIAL  4.  Increase AROM DF to 8d Baseline: 0d Goal status: INITIAL  5.  Increase R ankle strength to 4/5 Baseline:  Ankle dorsiflexion 4-   Ankle plantarflexion 3    Goal status: INITIAL    PLAN:  PT FREQUENCY: 1-2x/week  PT DURATION: 6 weeks  PLANNED INTERVENTIONS: 97110-Therapeutic exercises, 97530- Therapeutic activity, 97112- Neuromuscular re-education, 97535- Self Care, 02859- Manual therapy, [phone]- Gait training, Patient/Family education, Balance training, and Stair training  PLAN FOR NEXT SESSION: HEP review and update, manual techniques as appropriate, aerobic tasks, ROM and flexibility activities, strengthening and PREs, TPDN, gait and balance training as needed   For all possible CPT codes, reference the Planned Interventions line above.     Check all conditions that are expected to impact treatment: {Conditions expected to impact treatment:Structural or anatomic abnormalities and Complications related to surgery   If treatment provided at initial evaluation, no treatment charged due to lack of authorization.       Wasil Wolke M Dellia Donnelly, PT 03/22/2024, 6:26 PM

## 2024-03-24 ENCOUNTER — Ambulatory Visit

## 2024-03-24 DIAGNOSIS — M25571 Pain in right ankle and joints of right foot: Secondary | ICD-10-CM | POA: Diagnosis not present

## 2024-03-24 DIAGNOSIS — M76821 Posterior tibial tendinitis, right leg: Secondary | ICD-10-CM

## 2024-03-24 DIAGNOSIS — M6281 Muscle weakness (generalized): Secondary | ICD-10-CM

## 2024-03-24 NOTE — Therapy (Unsigned)
 OUTPATIENT PHYSICAL THERAPY TREATMENT    Patient Name: Melinda Padilla MRN: [id number] DOB:1990/11/12, 33 y.o., female Today's Date: 03/24/2024  END OF SESSION:  PT End of Session - 03/24/24 1752     Visit Number 4    Number of Visits 12    Date for Recertification  04/05/24    Authorization Type MCD    Authorization Time Period approved 6 PT visits from 02/23/24-04/05/24    Authorization - Visit Number 4    Authorization - Number of Visits 6    PT Start Time 1745    PT Stop Time 1825    PT Time Calculation (min) 40 min    Activity Tolerance Patient tolerated treatment well    Behavior During Therapy Melinda Padilla for tasks assessed/performed            Past Medical History:  Diagnosis Date   Heart murmur    Past Surgical History:  Procedure Laterality Date   NO PAST SURGERIES     ORIF ANKLE FRACTURE Right 11/26/2020   Procedure: OPEN REDUCTION INTERNAL FIXATION (ORIF) RIGHT LATERAL MALLEOLUS;  Surgeon: Melinda Kay HERO, MD;  Location: [facility];  Service: Orthopedics;  Laterality: Right;   Patient Active Problem List   Diagnosis Date Noted   Nausea and vomiting in adult 04/08/2023   Gastroesophageal reflux disease without esophagitis 04/08/2023   Screening examination for STD (sexually transmitted disease) 03/17/2023   Candidiasis 03/17/2023   Class 1 obesity due to excess calories with body mass index (BMI) of 30.0 to 30.9 in adult 03/17/2023   Symptomatic mammary hypertrophy 02/03/2023   Back pain 02/03/2023   Cerumen debris on tympanic membrane, right 01/20/2023   Encounter for gynecological examination without abnormal finding 01/14/2023   Sensation of plugged ear on right side 01/14/2023   ADHD (attention deficit hyperactivity disorder), combined type 11/20/2022   Anxiety 11/20/2022   Smoking addiction 11/20/2022   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   Tobacco abuse 10/20/2022   Impaired memory 10/20/2022   Class 1 obesity due  to excess calories with body mass index (BMI) of 31.0 to 31.9 in adult 10/20/2022   Heart murmur 10/14/2022   Frequent headaches 10/14/2022   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   Recurrent epistaxis 08/19/2022   Displaced fracture of lateral malleolus of right fibula, initial encounter for closed fracture     PCP: Melinda Pries, NP   REFERRING PROVIDER: Jerri Kay HERO, MD  REFERRING DIAG: [phone] (ICD-10-CM) - Acute right ankle pain M76.821 (ICD-10-CM) - Posterior tibial tendon dysfunction (PTTD) of right lower extremity  THERAPY DIAG:  Pain in right ankle and joints of right foot  Posterior tibial tendinitis of right lower extremity  Muscle weakness (generalized)  Rationale for Evaluation and Treatment: Rehabilitation  ONSET DATE: 4 weeks  SUBJECTIVE:   SUBJECTIVE STATEMENT:  No change in symptoms to report.  Has begun to experience R knee pain due to valgus collapse from R pes planus   PERTINENT HISTORY: Melinda Padilla is a 33 year old female with flat feet who presents with medial ankle pain. She was referred by [facility] for evaluation of her ankle pain.   Two weeks ago, she began experiencing pain in the medial aspect of her ankle, specifically behind the medial malleolus. The area is tender, especially when walking, and the pain is localized without symptoms on the lateral side of the ankle. She has flat feet and uses orthotics for support. She engages in physical therapy exercises, including those involving  towels, to strengthen the tendon. She recalls hitting her ankle, which caused temporary coldness and pain in her toes, resolving within twenty minutes after sitting down. She noticed an increased pulling sensation in the area following the incident. PAIN:  Are you having pain? Yes: NPRS scale: Padilla/10 Pain location: medial R ankle Pain description: ache, sore Aggravating factors: prolonged positions Relieving factors: position change  PRECAUTIONS: None  RED  FLAGS: None   WEIGHT BEARING RESTRICTIONS: No  FALLS:  Has patient fallen in last 6 months? No  OCCUPATION: custodian  PLOF: Independent  PATIENT GOALS: To manage my ankle symptoms  NEXT MD VISIT: TBD  OBJECTIVE:  Note: Objective measures were completed at Evaluation unless otherwise noted.  DIAGNOSTIC FINDINGS: Ankle X-ray: Internal fixation with plate and screws in place, no displacement, symptoms on medial side, appears satisfactory (12/22/2023) X-ray of the right ankle show prior plate and screw construct from ORIF  without any complications.  There is degenerative spurring of the ankle  joint and of the midfoot.  No acute abnormalities.   PATIENT SURVEYS:  LEFS 41/80    MUSCLE LENGTH: deferred  POSTURE: pes planus R with ER  PALPATION: deferred  LOWER EXTREMITY ROM:  Active ROM Right eval Left eval  Hip flexion    Hip extension    Hip abduction    Hip adduction    Hip internal rotation    Hip external rotation    Knee flexion    Knee extension    Ankle dorsiflexion 0/3d   Ankle plantarflexion    Ankle inversion    Ankle eversion     (Blank rows = not tested)  LOWER EXTREMITY MMT:  MMT Right eval Left eval  Hip flexion    Hip extension    Hip abduction    Hip adduction    Hip internal rotation    Hip external rotation    Knee flexion    Knee extension    Ankle dorsiflexion 4-   Ankle plantarflexion 3   Ankle inversion    Ankle eversion     (Blank rows = not tested)  LOWER EXTREMITY SPECIAL TESTS:  Ankle special tests: Tinel's test-Posterior tibialis: positive   FUNCTIONAL TESTS:  30 seconds chair stand test 7  SLS 30s L, 10s R  GAIT: Distance walked: 44ftx2 Assistive device utilized: None Level of assistance: Complete Independence Comments: unremarkable                                                                                                                                TREATMENT: [facility] Adult PT Treatment:                                                 DATE: 03/24/24 Therapeutic Exercise: Nustep L4 8 min 60 SPM Neuromuscular re-ed: Standing  gastroc stretch w/supination bias 60s x2  B heel raise with tennis ball 15x2 3 way ankle RTB 15x2 Therapeutic Activity: Runners step 6 in Padilla# KB 15/15 L hip flexion and R trunk rotation against wall 15x2 from blue t-band pad Seated soleus raises 25# DB 50x focus on neutral foot   [facility] Adult PT Treatment:                                                DATE: 03/22/25 Therapeutic Exercise: Nustep L4 8 min 60 SPM Neuromuscular re-ed: Standing gastroc stertch w/supination bias 60s x2  B heel raise with tennis ball 15x2 3 way ankle RTB 15x2 Therapeutic Activity: Glute medius hip hike 15x B from airex pad L hip flexion and R trunk rotation against wall 15x2 from airex pad Seated soleus raises 25# DB 50x  [facility] Adult PT Treatment:                                                DATE: 03/01/24 Therapeutic Exercise: Nustep L4 8 min Neuromuscular re-ed: Standing gastroc stertch w/supination bias 60s x2  B heel raise with tennis ball 15x2 Therapeutic Activity: Glute medius hip hike 15x B L hip flexion and R trunk rotation against wall 15x Eversion mobs in prone  [facility] Adult PT Treatment:                                                DATE: 02/04/24  Self Care: Additional minutes spent for educating on updated Therapeutic Home Exercise Program as well as comparing current status to condition at start of symptoms. This included exercises focusing on stretching, strengthening, with focus on eccentric aspects. Long term goals include an improvement in range of motion, strength, endurance as well as avoiding reinjury. Patient's frequency would include in 1-2 times a day, 3-Padilla times a week for a duration of 6-12 weeks. Proper technique shown and discussed handout in great detail. All questions were discussed and addressed.     PATIENT EDUCATION:  Education details: Discussed  eval findings, rehab rationale and POC and patient is in agreement  Person educated: Patient Education method: Explanation and Handouts Education comprehension: verbalized understanding and needs further education  HOME EXERCISE PROGRAM: Access Code: [email or web address] URL: [email or web address] Date: 02/04/2024 Prepared by: Reyes Kohut  Exercises - Long Sitting Plantar Fascia Stretch with Towel  - 2 x daily - Padilla x weekly - 1 sets - 2 reps - 60s hold - Gastroc Stretch on Wall  - 2 x daily - Padilla x weekly - 1 sets - 2 reps - 30s hold - Soleus Stretch on Wall  - 2 x daily - Padilla x weekly - 1 sets - 2 reps - 30s hold - Single Leg Stance  - 2 x daily - Padilla x weekly - 1 sets - 2 reps - 30s hold  ASSESSMENT:  CLINICAL IMPRESSION: Has begun to experience R knee pain as a result from valgus collapse from pes planus.  Uncomfortable maintaining neutral posture.  Improved mobility observed during stretching.  Advanced to runners step cuing to limit valgus  collapse at R knee. Discussed benefir of more supportive shoes/footwear.    Patient is a 33 y.o. female who was seen today for physical therapy evaluation and treatment for R ankle pain.  Patient presents with decreased ROM into DF, INV/EV functional with hardware in place.  Strength and balance deficits identified.  R toe out noted due to pesplanus and hip tightness, mild discomfort with palpation of posterior tibialis tendon.  OBJECTIVE IMPAIRMENTS: Abnormal gait, decreased balance, decreased knowledge of condition, decreased mobility, decreased ROM, decreased strength, and pain.   ACTIVITY LIMITATIONS: carrying, lifting, sitting, standing, squatting, and stairs  PERSONAL FACTORS: Age, Past/current experiences, and Time since onset of injury/illness/exacerbation are also affecting patient's functional outcome.   REHAB POTENTIAL: Good  CLINICAL DECISION MAKING: Stable/uncomplicated  EVALUATION COMPLEXITY: Moderate   GOALS: Goals reviewed  with patient? No  SHORT TERM GOALS: Target date: 02/25/2024   Patient to demonstrate independence in HEP  Baseline: Access Code: [email or web address] Goal status: INITIAL  2.  15s R SLS  Baseline: 10s Goal status: INITIAL    LONG TERM GOALS: Target date: 03/31/2024    Patient will acknowledge 2/10 pain at least once during episode of care   Baseline: Padilla/10 Goal status: INITIAL  2.  Patient will score at least 60/80 on FOTO to signify clinically meaningful improvement in functional abilities.   Baseline: 41/80 Goal status: INITIAL  3.  Patient will increase 30s chair stand reps from 7 to 10 with/without arms to demonstrate and improved functional ability with less pain/difficulty as well as reduce fall risk.  Baseline: 7 Goal status: INITIAL  4.  Increase AROM DF to 8d Baseline: 0d Goal status: INITIAL  Padilla.  Increase R ankle strength to 4/Padilla Baseline:  Ankle dorsiflexion 4-   Ankle plantarflexion 3    Goal status: INITIAL    PLAN:  PT FREQUENCY: 1-2x/week  PT DURATION: 6 weeks  PLANNED INTERVENTIONS: 97110-Therapeutic exercises, 97530- Therapeutic activity, 97112- Neuromuscular re-education, 97535- Self Care, [id number]- Manual therapy, [phone]- Gait training, Patient/Family education, Balance training, and Stair training  PLAN FOR NEXT SESSION: HEP review and update, manual techniques as appropriate, aerobic tasks, ROM and flexibility activities, strengthening and PREs, TPDN, gait and balance training as needed   For all possible CPT codes, reference the Planned Interventions line above.     Check all conditions that are expected to impact treatment: {Conditions expected to impact treatment:Structural or anatomic abnormalities and Complications related to surgery   If treatment provided at initial evaluation, no treatment charged due to lack of authorization.       Aven Christen M Zaim Nitta, PT 03/24/2024, Padilla:54 PM

## 2024-03-28 NOTE — Therapy (Deleted)
 OUTPATIENT PHYSICAL THERAPY TREATMENT    Patient Name: Melinda Padilla MRN: [id number] DOB:31-Jan-1991, 33 y.o., female Today's Date: 03/28/2024  END OF SESSION:      Past Medical History:  Diagnosis Date   Heart murmur    Past Surgical History:  Procedure Laterality Date   NO PAST SURGERIES     ORIF ANKLE FRACTURE Right 11/26/2020   Procedure: OPEN REDUCTION INTERNAL FIXATION (ORIF) RIGHT LATERAL MALLEOLUS;  Surgeon: Jerri Kay HERO, MD;  Location: [facility];  Service: Orthopedics;  Laterality: Right;   Patient Active Problem List   Diagnosis Date Noted   Nausea and vomiting in adult 04/08/2023   Gastroesophageal reflux disease without esophagitis 04/08/2023   Screening examination for STD (sexually transmitted disease) 03/17/2023   Candidiasis 03/17/2023   Class 1 obesity due to excess calories with body mass index (BMI) of 30.0 to 30.9 in adult 03/17/2023   Symptomatic mammary hypertrophy 02/03/2023   Back pain 02/03/2023   Cerumen debris on tympanic membrane, right 01/20/2023   Encounter for gynecological examination without abnormal finding 01/14/2023   Sensation of plugged ear on right side 01/14/2023   ADHD (attention deficit hyperactivity disorder), combined type 11/20/2022   Anxiety 11/20/2022   Smoking addiction 11/20/2022   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   Tobacco abuse 10/20/2022   Impaired memory 10/20/2022   Class 1 obesity due to excess calories with body mass index (BMI) of 31.0 to 31.9 in adult 10/20/2022   Heart murmur 10/14/2022   Frequent headaches 10/14/2022   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  Rationale for Evaluation and Treatment: Rehabilitation  ONSET DATE: 4 weeks  SUBJECTIVE:   SUBJECTIVE STATEMENT:  No change in symptoms to report.  Has begun to experience R knee pain due to valgus collapse from R pes planus   PERTINENT HISTORY: [name] is a 33 year old female with flat feet who presents with medial ankle pain. She was referred by [facility] for evaluation of her ankle pain.   Two weeks ago, she began experiencing pain in the medial aspect of her ankle, specifically behind the medial malleolus. The area is tender, especially when walking, and the pain is localized without symptoms on the lateral side of the ankle. She has flat feet and uses orthotics for support. She engages in physical therapy exercises, including those involving towels, to strengthen the tendon. She recalls hitting her ankle, which caused temporary coldness and pain in her toes, resolving within twenty minutes after sitting down. She noticed an increased pulling sensation in the area following the incident. PAIN:  Are you having pain? Yes: NPRS scale: 5/10 Pain location: medial R ankle Pain description: ache, sore Aggravating factors: prolonged positions Relieving factors: position change  PRECAUTIONS: None  RED FLAGS: None   WEIGHT BEARING RESTRICTIONS: No  FALLS:  Has patient fallen in last 6 months? No  OCCUPATION: custodian  PLOF: Independent  PATIENT GOALS: To manage my ankle symptoms  NEXT MD VISIT: TBD  OBJECTIVE:  Note: Objective measures were completed at Evaluation unless otherwise noted.  DIAGNOSTIC FINDINGS: Ankle X-ray: Internal fixation with plate and  screws in place, no displacement, symptoms on medial side, appears satisfactory (12/22/2023) X-ray of the right ankle show prior plate and screw construct from ORIF  without any complications.  There is degenerative spurring of the ankle   joint and of the midfoot.  No acute abnormalities.   PATIENT SURVEYS:  LEFS 41/80    MUSCLE LENGTH: deferred  POSTURE: pes planus R with ER  PALPATION: deferred  LOWER EXTREMITY ROM:  Active ROM Right eval Left eval  Hip flexion    Hip extension    Hip abduction    Hip adduction    Hip internal rotation    Hip external rotation    Knee flexion    Knee extension    Ankle dorsiflexion 0/3d   Ankle plantarflexion    Ankle inversion    Ankle eversion     (Blank rows = not tested)  LOWER EXTREMITY MMT:  MMT Right eval Left eval  Hip flexion    Hip extension    Hip abduction    Hip adduction    Hip internal rotation    Hip external rotation    Knee flexion    Knee extension    Ankle dorsiflexion 4-   Ankle plantarflexion 3   Ankle inversion    Ankle eversion     (Blank rows = not tested)  LOWER EXTREMITY SPECIAL TESTS:  Ankle special tests: Tinel's test-Posterior tibialis: positive   FUNCTIONAL TESTS:  30 seconds chair stand test 7  SLS 30s L, 10s R  GAIT: Distance walked: 60ftx2 Assistive device utilized: None Level of assistance: Complete Independence Comments: unremarkable                                                                                                                                TREATMENT: [facility] Adult PT Treatment:                                                DATE: 03/24/24 Therapeutic Exercise: Nustep L4 8 min 60 SPM Neuromuscular re-ed: Standing gastroc stretch w/supination bias 60s x2  B heel raise with tennis ball 15x2 3 way ankle RTB 15x2 Therapeutic Activity: Runners step 6 in 5# KB 15/15 L hip flexion and R trunk rotation against wall 15x2 from blue t-band pad Seated soleus raises 25# DB 50x focus on neutral foot   [facility] Adult PT Treatment:                                                DATE: 03/22/25 Therapeutic Exercise: Nustep L4 8 min 60 SPM Neuromuscular re-ed: Standing gastroc stertch w/supination bias  60s x2  B heel raise with tennis ball  15x2 3 way ankle RTB 15x2 Therapeutic Activity: Glute medius hip hike 15x B from airex pad L hip flexion and R trunk rotation against wall 15x2 from airex pad Seated soleus raises 25# DB 50x  [facility] Adult PT Treatment:                                                DATE: 03/01/24 Therapeutic Exercise: Nustep L4 8 min Neuromuscular re-ed: Standing gastroc stertch w/supination bias 60s x2  B heel raise with tennis ball 15x2 Therapeutic Activity: Glute medius hip hike 15x B L hip flexion and R trunk rotation against wall 15x Eversion mobs in prone  [facility] Adult PT Treatment:                                                DATE: 02/04/24  Self Care: Additional minutes spent for educating on updated Therapeutic Home Exercise Program as well as comparing current status to condition at start of symptoms. This included exercises focusing on stretching, strengthening, with focus on eccentric aspects. Long term goals include an improvement in range of motion, strength, endurance as well as avoiding reinjury. Patient's frequency would include in 1-2 times a day, 3-5 times a week for a duration of 6-12 weeks. Proper technique shown and discussed handout in great detail. All questions were discussed and addressed.     PATIENT EDUCATION:  Education details: Discussed eval findings, rehab rationale and POC and patient is in agreement  Person educated: Patient Education method: Explanation and Handouts Education comprehension: verbalized understanding and needs further education  HOME EXERCISE PROGRAM: Access Code: [email or web address] URL: [email or web address] Date: 02/04/2024 Prepared by: Reyes Kohut  Exercises - Long Sitting Plantar Fascia Stretch with Towel  - 2 x daily - 5 x weekly - 1 sets - 2 reps - 60s hold - Gastroc Stretch on Wall  - 2 x daily - 5 x weekly - 1 sets - 2 reps - 30s hold - Soleus Stretch on Wall  - 2 x daily - 5 x weekly - 1 sets -  2 reps - 30s hold - Single Leg Stance  - 2 x daily - 5 x weekly - 1 sets - 2 reps - 30s hold  ASSESSMENT:  CLINICAL IMPRESSION: Has begun to experience R knee pain as a result from valgus collapse from pes planus.  Uncomfortable maintaining neutral posture.  Improved mobility observed during stretching.  Advanced to runners step cuing to limit valgus collapse at R knee. Discussed benefir of more supportive shoes/footwear.    Patient is a 33 y.o. female who was seen today for physical therapy evaluation and treatment for R ankle pain.  Patient presents with decreased ROM into DF, INV/EV functional with hardware in place.  Strength and balance deficits identified.  R toe out noted due to pesplanus and hip tightness, mild discomfort with palpation of posterior tibialis tendon.  OBJECTIVE IMPAIRMENTS: Abnormal gait, decreased balance, decreased knowledge of condition, decreased mobility, decreased ROM, decreased strength, and pain.   ACTIVITY LIMITATIONS: carrying, lifting, sitting, standing, squatting, and stairs  PERSONAL FACTORS: Age, Past/current experiences, and Time since onset of injury/illness/exacerbation are also affecting patient's functional outcome.   REHAB POTENTIAL: Good  CLINICAL DECISION MAKING: Stable/uncomplicated  EVALUATION COMPLEXITY: Moderate   GOALS: Goals reviewed with patient? No  SHORT TERM GOALS: Target date: 02/25/2024   Patient to demonstrate independence in HEP  Baseline: Access Code: [email or web address] Goal status: INITIAL  2.  15s R SLS  Baseline: 10s Goal status: INITIAL    LONG TERM GOALS: Target date: 03/31/2024    Patient will acknowledge 2/10 pain at least once during episode of care   Baseline: 5/10 Goal status: INITIAL  2.  Patient will score at least 60/80 on FOTO to signify clinically meaningful improvement in functional abilities.   Baseline: 41/80 Goal status: INITIAL  3.  Patient will increase 30s chair stand reps from 7 to 10 with/without  arms to demonstrate and improved functional ability with less pain/difficulty as well as reduce fall risk.  Baseline: 7 Goal status: INITIAL  4.  Increase AROM DF to 8d Baseline: 0d Goal status: INITIAL  5.  Increase R ankle strength to 4/5 Baseline:  Ankle dorsiflexion 4-   Ankle plantarflexion 3    Goal status: INITIAL    PLAN:  PT FREQUENCY: 1-2x/week  PT DURATION: 6 weeks  PLANNED INTERVENTIONS: 97110-Therapeutic exercises, 97530- Therapeutic activity, 97112- Neuromuscular re-education, 97535- Self Care, 02859- Manual therapy, [phone]- Gait training, Patient/Family education, Balance training, and Stair training  PLAN FOR NEXT SESSION: HEP review and update, manual techniques as appropriate, aerobic tasks, ROM and flexibility activities, strengthening and PREs, TPDN, gait and balance training as needed   For all possible CPT codes, reference the Planned Interventions line above.     Check all conditions that are expected to impact treatment: {Conditions expected to impact treatment:Structural or anatomic abnormalities and Complications related to surgery   If treatment provided at initial evaluation, no treatment charged due to lack of authorization.       Ibrahima Holberg M Sakiya Stepka, PT 03/28/2024, 8:09 AM

## 2024-03-29 ENCOUNTER — Ambulatory Visit

## 2024-03-30 ENCOUNTER — Encounter: Payer: Self-pay | Admitting: Adult Health

## 2024-03-30 ENCOUNTER — Ambulatory Visit (HOSPITAL_COMMUNITY)
Admission: EM | Admit: 2024-03-30 | Discharge: 2024-03-30 | Disposition: A | Discharge status: Home / Self Care | Attending: Internal Medicine | Admitting: Internal Medicine

## 2024-03-30 ENCOUNTER — Other Ambulatory Visit (HOSPITAL_COMMUNITY): Payer: Self-pay

## 2024-03-30 DIAGNOSIS — F17210 Nicotine dependence, cigarettes, uncomplicated: Secondary | ICD-10-CM | POA: Diagnosis not present

## 2024-03-30 DIAGNOSIS — J069 Acute upper respiratory infection, unspecified: Secondary | ICD-10-CM

## 2024-03-30 LAB — POC SOFIA SARS ANTIGEN FIA: SARS Coronavirus 2 Ag: NEGATIVE

## 2024-03-30 MED ORDER — GUAIFENESIN ER 1200 MG PO TB12
1200.0000 mg | ORAL_TABLET | Freq: Two times a day (BID) | ORAL | 0 refills | Status: DC
  Filled 2024-03-30: qty 14, 7d supply, fill #0

## 2024-03-30 MED ORDER — ALBUTEROL SULFATE HFA 108 (90 BASE) MCG/ACT IN AERS
2.0000 | INHALATION_SPRAY | Freq: Four times a day (QID) | RESPIRATORY_TRACT | 0 refills | Status: AC | PRN
  Filled 2024-03-30: qty 6.7, 25d supply, fill #0

## 2024-03-30 MED ORDER — PROMETHAZINE-DM 6.25-15 MG/5ML PO SYRP
5.0000 mL | ORAL_SOLUTION | Freq: Every evening | ORAL | 0 refills | Status: DC | PRN
  Filled 2024-03-30: qty 118, 23d supply, fill #0

## 2024-03-30 NOTE — ED Provider Notes (Signed)
 [facility]    CSN: [id number] Arrival date & time: 03/30/24  1010      History   Chief Complaint Chief Complaint  Patient presents with   Cough    HPI Melinda Padilla is a 33 y.o. female.   Melinda Padilla is a 33 y.o. female presenting for chief complaint of Cough, congestion, sore throat, and generalized fatigue that started 5 days ago on Friday March 25, 2024. Today is day 6 of symptoms. Cough is productive with yellow/green sputum. Reports shortness of breath and chest tightness associated with coughing.  Denies nausea, vomiting, diarrhea, abdominal pain, and rashes.  Recently exposed to a sick child with similar symptoms.  She denies fever/chills.  Current everyday cigarette and marijuana smoker, denies history of asthma or COPD.  Taking TheraFlu without relief.   Cough   Past Medical History:  Diagnosis Date   Heart murmur     Patient Active Problem List   Diagnosis Date Noted   Nausea and vomiting in adult 04/08/2023   Gastroesophageal reflux disease without esophagitis 04/08/2023   Screening examination for STD (sexually transmitted disease) 03/17/2023   Candidiasis 03/17/2023   Class 1 obesity due to excess calories with body mass index (BMI) of 30.0 to 30.9 in adult 03/17/2023   Symptomatic mammary hypertrophy 02/03/2023   Back pain 02/03/2023   Cerumen debris on tympanic membrane, right 01/20/2023   Encounter for gynecological examination without abnormal finding 01/14/2023   Sensation of plugged ear on right side 01/14/2023   ADHD (attention deficit hyperactivity disorder), combined type 11/20/2022   Anxiety 11/20/2022   Smoking addiction 11/20/2022   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   Tobacco abuse 10/20/2022   Impaired memory 10/20/2022   Class 1 obesity due to excess calories with body mass index (BMI) of 31.0 to 31.9 in adult 10/20/2022   Heart murmur 10/14/2022   Frequent headaches 10/14/2022   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      Home Medications    Prior to Admission medications   Medication Sig Start Date End Date Taking? Authorizing Provider  albuterol  (VENTOLIN  HFA) 108 (90 Base) MCG/ACT inhaler Inhale 2 puffs into the lungs every 6 (six) hours as needed for wheezing or shortness of breath. 03/30/24  Yes Enedelia Dorna HERO, FNP  Guaifenesin  1200 MG TB12 Take 1 tablet (1,200 mg total) by mouth in the morning and at bedtime. 03/30/24  Yes Enedelia Dorna HERO, FNP  promethazine -dextromethorphan (PROMETHAZINE -DM) 6.25-15 MG/5ML syrup Take 5 mLs by mouth at bedtime as needed for cough. 03/30/24  Yes Enedelia Dorna HERO, FNP  rizatriptan  (MAXALT -MLT) 10 MG disintegrating tablet Take 1 tablet (10 mg total) by mouth as needed for migraine. May repeat in 2 hours if needed 01/01/24   Whitfield Raisin, NP    Family History Family History  Problem Relation Age of Onset   Breast cancer Mother        42s   Diabetes Mother    Cancer Mother  Ovarian cancer Mother        55s   Healthy Father    Stroke Maternal Grandmother    Stroke Maternal Grandfather    Heart failure Paternal Grandmother    Kidney failure Paternal Grandmother    Stroke Paternal Grandmother    Cancer Paternal Grandfather     Social History Social History   Tobacco Use   Smoking status: Every Day    Current packs/day: 0.50    Types: Cigarettes    Passive exposure: Current   Smokeless tobacco: Never  Vaping Use   Vaping  status: Former  Substance Use Topics   Alcohol use: No   Drug use: Yes    Types: Marijuana     Allergies   Latex   Review of Systems Review of Systems  Respiratory:  Positive for cough.   Per HPI   Physical Exam Triage Vital Signs ED Triage Vitals  Encounter Vitals Group     BP 03/30/24 1200 123/87     Girls Systolic BP Percentile --      Girls Diastolic BP Percentile --      Boys Systolic BP Percentile --      Boys Diastolic BP Percentile --      Pulse Rate 03/30/24 1200 73     Resp 03/30/24 1200 16     Temp 03/30/24 1200 98.4 F (36.9 C)     Temp Source 03/30/24 1200 Oral     SpO2 03/30/24 1200 97 %     Weight --      Height --      Head Circumference --      Peak Flow --      Pain Score 03/30/24 1201 3     Pain Loc --      Pain Education --      Exclude from Growth Chart --    No data found.  Updated Vital Signs BP 123/87 (BP Location: Right Arm)   Pulse 73   Temp 98.4 F (36.9 C) (Oral)   Resp 16   SpO2 97%   Visual Acuity Right Eye Distance:   Left Eye Distance:   Bilateral Distance:    Right Eye Near:   Left Eye Near:    Bilateral Near:     Physical Exam Vitals and nursing note reviewed.  Constitutional:      Appearance: She is not ill-appearing or toxic-appearing.  HENT:     Head: Normocephalic and atraumatic.     Right Ear: Hearing, tympanic membrane, ear canal and external ear normal.     Left Ear: Hearing, tympanic membrane, ear canal and external ear normal.     Nose: Congestion present.     Mouth/Throat:     Lips: Pink.     Mouth: Mucous membranes are moist. No injury or oral lesions.     Dentition: Normal dentition.     Tongue: No lesions.     Pharynx: Oropharynx is clear. Uvula midline. No pharyngeal swelling, oropharyngeal exudate, posterior oropharyngeal erythema, uvula swelling or postnasal drip.     Tonsils: No tonsillar exudate.  Eyes:     General: Lids are normal. Vision grossly intact. Gaze aligned appropriately.      Extraocular Movements: Extraocular movements intact.     Conjunctiva/sclera: Conjunctivae normal.  Neck:     Trachea: Trachea and phonation normal.  Cardiovascular:     Rate and Rhythm: Normal rate and regular rhythm.     Heart sounds: Normal heart sounds, S1 normal and S2  normal.  Pulmonary:     Effort: Pulmonary effort is normal. No respiratory distress.     Breath sounds: Normal breath sounds and air entry. No wheezing, rhonchi or rales.  Chest:     Chest wall: No tenderness.  Musculoskeletal:     Cervical back: Neck supple.  Lymphadenopathy:     Cervical: No cervical adenopathy.  Skin:    General: Skin is warm and dry.     Capillary Refill: Capillary refill takes less than 2 seconds.     Findings: No rash.  Neurological:     General: No focal deficit present.     Mental Status: She is alert and oriented to person, place, and time. Mental status is at baseline.     Cranial Nerves: No dysarthria or facial asymmetry.  Psychiatric:        Mood and Affect: Mood normal.        Speech: Speech normal.        Behavior: Behavior normal.        Thought Content: Thought content normal.        Judgment: Judgment normal.      UC Treatments / Results  Labs (all labs ordered are listed, but only abnormal results are displayed) Labs Reviewed  POC SOFIA SARS ANTIGEN FIA    EKG   Radiology No results found.  Procedures Procedures (including critical care time)  Medications Ordered in UC Medications - No data to display  Initial Impression / Assessment and Plan / UC Course  I have reviewed the triage vital signs and the nursing notes.  Pertinent labs & imaging results that were available during my care of the patient were reviewed by me and considered in my medical decision making (see chart for details).   1.  Viral URI with cough, cigarette nicotine dependence without complication Suspect viral URI, viral syndrome.  Strep/viral testing: Point-of-care COVID testing is  negative. No signs of bronchitis on exam, however I would like for her to be able to use albuterol  inhaler 2 puffs every 4-6 hours as needed for shortness of breath and wheezing as she is at increased risk for bronchitis due to cigarette smoking history.  Physical exam findings reassuring, vital signs hemodynamically stable, and lungs clear, therefore deferred imaging of the chest.  Advised supportive care/prescriptions for symptomatic relief as outlined in AVS. .  Counseled patient on potential for adverse effects with medications prescribed/recommended today, strict ER and return-to-clinic precautions discussed, patient verbalized understanding.    Final Clinical Impressions(s) / UC Diagnoses   Final diagnoses:  Viral URI with cough  Cigarette nicotine dependence without complication     Discharge Instructions      You have a viral illness which will improve on its own with rest, fluids, and medications to help with your symptoms.  Tylenol , guaifenesin  (plain mucinex ), and saline nasal sprays may help relieve symptoms.   Two teaspoons of honey in 1 cup of warm water every 4-6 hours may help with throat pains.  Humidifier in room at nighttime may help soothe cough (clean well daily).   Take Promethazine  DM cough medication to help with your cough at nighttime so that you are able to sleep. Do not drive, drink alcohol, or go to work while taking this medication since it can make you sleepy. Only take this at nighttime.   For chest pain, shortness of breath, inability to keep food or fluids down without vomiting, fever that does not respond to tylenol  or motrin , or any  other severe symptoms, please go to the ER for further evaluation. Return to urgent care as needed, otherwise follow-up with PCP.     ED Prescriptions     Medication Sig Dispense Auth. Provider   albuterol  (VENTOLIN  HFA) 108 (90 Base) MCG/ACT inhaler Inhale 2 puffs into the lungs every 6 (six) hours as needed for  wheezing or shortness of breath. 8 g Enedelia Going M, FNP   Guaifenesin  1200 MG TB12 Take 1 tablet (1,200 mg total) by mouth in the morning and at bedtime. 14 tablet Enedelia Going HERO, FNP   promethazine -dextromethorphan (PROMETHAZINE -DM) 6.25-15 MG/5ML syrup Take 5 mLs by mouth at bedtime as needed for cough. 118 mL Enedelia Going HERO, FNP      PDMP not reviewed this encounter.   Enedelia Going HERO, [place] 03/30/24 1315

## 2024-03-30 NOTE — ED Triage Notes (Signed)
 Pt has c/o 5 days, fatigue, cough, runny nose, and chest pain since Friday.  Felt short of breath yesterday when waking up. Took Theraflu last night with no relief.

## 2024-03-30 NOTE — Discharge Instructions (Signed)
 You have a viral illness which will improve on its own with rest, fluids, and medications to help with your symptoms.  Tylenol , guaifenesin  (plain mucinex ), and saline nasal sprays may help relieve symptoms.   Two teaspoons of honey in 1 cup of warm water every 4-6 hours may help with throat pains.  Humidifier in room at nighttime may help soothe cough (clean well daily).   Take Promethazine  DM cough medication to help with your cough at nighttime so that you are able to sleep. Do not drive, drink alcohol, or go to work while taking this medication since it can make you sleepy. Only take this at nighttime.   For chest pain, shortness of breath, inability to keep food or fluids down without vomiting, fever that does not respond to tylenol  or motrin , or any other severe symptoms, please go to the ER for further evaluation. Return to urgent care as needed, otherwise follow-up with PCP.

## 2024-03-31 ENCOUNTER — Ambulatory Visit

## 2024-04-02 ENCOUNTER — Inpatient Hospital Stay (HOSPITAL_COMMUNITY): Admission: RE | Admit: 2024-04-02 | Discharge: 2024-04-02

## 2024-04-02 ENCOUNTER — Encounter (HOSPITAL_COMMUNITY): Payer: Self-pay

## 2024-04-02 VITALS — BP 130/81 | HR 68 | Temp 98.6°F | Resp 18

## 2024-04-02 DIAGNOSIS — T7840XA Allergy, unspecified, initial encounter: Secondary | ICD-10-CM

## 2024-04-02 DIAGNOSIS — J3089 Other allergic rhinitis: Secondary | ICD-10-CM | POA: Diagnosis not present

## 2024-04-02 MED ORDER — METHYLPREDNISOLONE SODIUM SUCC 125 MG IJ SOLR
INTRAMUSCULAR | Status: AC
  Filled 2024-04-02: qty 2

## 2024-04-02 MED ORDER — METHYLPREDNISOLONE SODIUM SUCC 125 MG IJ SOLR
80.0000 mg | Freq: Once | INTRAMUSCULAR | Status: AC
  Administered 2024-04-02: 80 mg via INTRAMUSCULAR

## 2024-04-02 MED ORDER — LEVOCETIRIZINE DIHYDROCHLORIDE 5 MG PO TABS: 5.0000 mg | ORAL_TABLET | Freq: Every evening | ORAL | 2 refills | Status: DC

## 2024-04-02 MED ORDER — FLUTICASONE PROPIONATE 50 MCG/ACT NA SUSP: 1.0000 | Freq: Every day | NASAL | 2 refills | Status: AC

## 2024-04-02 NOTE — ED Provider Notes (Signed)
 [facility]    CSN: [id number] Arrival date & time: 04/02/24  1206    HISTORY   Chief Complaint  Patient presents with   Allergic Reaction    Appt 1230   HPI Melinda Padilla is a pleasant, 33 y.o. female who presents to urgent care today. Patient was seen 3 days ago for viral upper respiratory infection.  Patient was provided with prescriptions for guaifenesin , Promethazine  DM and albuterol  inhaler.  Per provider note, patient is a current everyday cigarette smoker and also frequently smokes marijuana.  Today, patient states she started the albuterol  and Promethazine  DM as directed 3 days ago.  Patient states the next day she had itching of her face and lips.  States that she noticed rash on her forehead when she woke up yesterday accompanied by swelling of both of her eyes.  Patient denies throat swelling but continues to have a sore throat that she had 3 days ago.  Patient denies shortness of breath, tongue swelling.  Patient has normal vital signs on arrival today.  When asked if patient has seasonal allergies, she states not really.  Patient denies history of asthma.  The history is provided by the patient.  Allergic Reaction  Past Medical History:  Diagnosis Date   Heart murmur    Patient Active Problem List   Diagnosis Date Noted   Nausea and vomiting in adult 04/08/2023   Gastroesophageal reflux disease without esophagitis 04/08/2023   Screening examination for STD (sexually transmitted disease) 03/17/2023   Candidiasis 03/17/2023   Class 1 obesity due to excess calories with body mass index (BMI) of 30.0 to 30.9 in adult 03/17/2023   Symptomatic mammary hypertrophy 02/03/2023   Back pain 02/03/2023   Cerumen debris on tympanic membrane, right 01/20/2023   Encounter for gynecological examination without abnormal finding 01/14/2023   Sensation of plugged ear on right side 01/14/2023   ADHD (attention deficit hyperactivity disorder), combined type 11/20/2022    Anxiety 11/20/2022   Smoking addiction 11/20/2022   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   Tobacco abuse 10/20/2022   Impaired memory 10/20/2022   Class 1 obesity due to excess calories with body mass index (BMI) of 31.0 to 31.9 in adult 10/20/2022   Heart murmur 10/14/2022   Frequent headaches 10/14/2022   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    Home Medications    Prior to Admission medications  Medication Sig Start Date End Date Taking? Authorizing Provider  albuterol  (VENTOLIN  HFA) 108 (90 Base) MCG/ACT inhaler Inhale 2 puffs into the lungs every 6 (six) hours as needed for wheezing or shortness of breath. 03/30/24   Enedelia Dorna HERO, FNP  Guaifenesin  1200 MG TB12 Take 1 tablet (1,200 mg total) by mouth in the morning and at bedtime. 03/30/24   Enedelia Dorna HERO, FNP  promethazine -dextromethorphan (PROMETHAZINE -DM) 6.25-15 MG/5ML syrup Take 5 mLs by mouth at bedtime as needed for cough. 03/30/24   Enedelia Dorna HERO, FNP  rizatriptan  (MAXALT -MLT) 10 MG disintegrating tablet Take 1 tablet (10 mg total) by mouth as needed  for migraine. May repeat in 2 hours if needed 01/01/24   Whitfield Raisin, NP    Family History Family History  Problem Relation Age of Onset   Breast cancer Mother        42s   Diabetes Mother    Cancer Mother    Ovarian cancer Mother        25s   Healthy Father    Stroke Maternal Grandmother    Stroke Maternal Grandfather    Heart failure Paternal Grandmother     Kidney failure Paternal Grandmother    Stroke Paternal Grandmother    Cancer Paternal Grandfather    Social History Social History[1] Allergies   Latex and Dextromethorphan  Review of Systems Review of Systems Pertinent findings revealed after performing a 14 point review of systems has been noted in the history of present illness.  Physical Exam Vital Signs BP 130/81   Pulse 68   Temp 98.6 F (37 C) (Oral)   Resp 18   LMP 03/25/2024 (Approximate)   SpO2 97%   No data found.  Physical Exam Vitals and nursing note reviewed.  Constitutional:      General: She is awake. She is not in acute distress.    Appearance: Normal appearance. She is well-developed and well-groomed. She is not ill-appearing.  HENT:     Head: Normocephalic and atraumatic.     Salivary Glands: Right salivary gland is not diffusely enlarged or tender. Left salivary gland is not diffusely enlarged or tender.     Right Ear: Hearing, ear canal and external ear normal. A middle ear effusion is present. Tympanic membrane is bulging. Tympanic membrane is not injected or erythematous.     Left Ear: Hearing, ear canal and external ear normal. A middle ear effusion is present. Tympanic membrane is bulging. Tympanic membrane is not injected or erythematous.     Ears:     Comments: Bilateral EACs normal, both TMs bulging with clear fluid    Nose: Rhinorrhea present. No nasal deformity, septal deviation, signs of injury or nasal tenderness. Rhinorrhea is clear.     Right Nostril: Occlusion present. No foreign body, epistaxis or septal hematoma.     Left Nostril: Occlusion present. No foreign body, epistaxis or septal hematoma.     Right Turbinates: Enlarged, swollen and pale.     Left Turbinates: Enlarged, swollen and pale.     Right Sinus: No maxillary sinus tenderness or frontal sinus tenderness.     Left Sinus: No maxillary sinus tenderness or frontal sinus tenderness.     Mouth/Throat:     Lips: Pink. No lesions.      Mouth: Mucous membranes are moist. No oral lesions.     Tongue: No lesions. Tongue does not deviate from midline.     Palate: No mass and lesions.     Pharynx: Oropharynx is clear. Uvula midline. Postnasal drip present. No pharyngeal swelling, oropharyngeal exudate, posterior oropharyngeal erythema or uvula swelling.     Tonsils: No tonsillar exudate. 0 on the right. 0 on the left.     Comments: Postnasal drip Eyes:     General: Lids are normal.        Right eye: No discharge.        Left eye: No discharge.     Conjunctiva/sclera: Conjunctivae normal.     Right eye: Right conjunctiva is not injected.     Left eye: Left conjunctiva is not injected.  Neck:     Trachea: Trachea and phonation  normal.  Cardiovascular:     Rate and Rhythm: Normal rate and regular rhythm.  Pulmonary:     Effort: Pulmonary effort is normal.     Breath sounds: Normal breath sounds.  Chest:     Chest wall: No tenderness.  Musculoskeletal:        General: Normal range of motion.     Cervical back: Full passive range of motion without pain, normal range of motion and neck supple. Normal range of motion.  Lymphadenopathy:     Cervical: No cervical adenopathy.  Skin:    General: Skin is warm and dry.     Findings: Rash (hives appreciated across forehead) present. No erythema.  Neurological:     General: No focal deficit present.     Mental Status: She is alert and oriented to person, place, and time. Mental status is at baseline.  Psychiatric:        Attention and Perception: Attention and perception normal.        Mood and Affect: Mood and affect normal.        Speech: Speech normal.        Behavior: Behavior normal. Behavior is cooperative.        Thought Content: Thought content normal.     Visual Acuity Right Eye Distance:   Left Eye Distance:   Bilateral Distance:    Right Eye Near:   Left Eye Near:    Bilateral Near:     UC Couse / Diagnostics / Procedures:     Radiology No results  found.  Procedures Procedures (including critical care time) EKG  Pending results:  Labs Reviewed - No data to display  Medications Ordered in UC: Medications  methylPREDNISolone  sodium succinate (SOLU-MEDROL ) 125 mg/2 mL injection 80 mg (has no administration in time range)    UC Diagnoses / Final Clinical Impressions(s)   I have reviewed the triage vital signs and the nursing notes.  Pertinent labs & imaging results that were available during my care of the patient were reviewed by me and considered in my medical decision making (see chart for details).    Final diagnoses:  Allergic reaction to drug, initial encounter  Environmental and seasonal allergies   EMR reviewed by me.  Patient has been prescribed and has tolerated promethazine  many times in the past.  States she never picked up the guaifenesin  prescribed 3 days ago.  Suspect patient has an allergy to dextromethorphan, this was added to her allergy list.  Patient was advised to discontinue Promethazine  DM at this time.  Based on physical exam findings, I believe patient will benefit from an injection of Solu-Medrol  to reduce not only the likely allergic reaction to dextromethorphan but will also reduce inflammation caused by what appears to be seasonal versus environmental allergies.  Patient also provided with prescription for Xyzal  and Flonase  which she has been advised to take daily for the next several months.  Patient advised to continue albuterol  as prescribed.  Conservative care recommended.  Return precautions advised.  Please see discharge instructions below for details of plan of care as provided to patient. ED Prescriptions     Medication Sig Dispense Auth. Provider   levocetirizine (XYZAL ) 5 MG tablet Take 1 tablet (5 mg total) by mouth every evening. 30 tablet Joesph Shaver Scales, PA-C   fluticasone  (FLONASE ) 50 MCG/ACT nasal spray Place 1 spray into both nostrils daily. Begin by using 2 sprays in each nare  daily for 3 to 5 days, then decrease to 1  spray in each nare daily. 15.8 mL Joesph Shaver Scales, PA-C      PDMP not reviewed this encounter.    Discharge Instructions      Please read below to learn more about the medications, dosages and frequencies that I recommend to help alleviate your symptoms and to get you feeling better soon:                      Solu-Medrol  IM (methylprednisolone ):  To quickly address your significant respiratory inflammation, you were provided with an injection of Solu-Medrol  in the office today.  You should continue to feel the full benefit of the steroid for the next 4 to 6 hours.                 Xyzal  (levocetirizine): This is an excellent second-generation antihistamine that helps to reduce respiratory inflammatory response to environmental allergens.  In some patients, this medication can cause daytime sleepiness so I recommend that you take 1 tablet daily at bedtime.                                 Flonase  (fluticasone ): This is a steroid nasal spray that used once daily, 1 spray in each nare.  This works best when used on a daily basis. This medication does not work well if it is only used when you think you need it.  After 3 to 5 days of use, you will notice significant reduction of the inflammation and mucus production that is currently being caused by exposure to allergens, whether seasonal or environmental.  The most common side effect of this medication is nosebleeds.  If you experience a nosebleed, please discontinue use for 1 week, then feel free to resume.  If you find that your insurance will not pay for this medication, please consider a different nasal steroids such as Nasonex (mometasone), or Nasacort (triamcinolone).       ProAir , Ventolin , Proventil  (albuterol ): This inhaled medication contains a short acting beta agonist bronchodilator.  This medication relaxes the smooth muscle of the airway in the lungs.  When these muscles are tight, breathing  becomes more constricted.  The result of relaxation of the smooth muscle is increased air movement and improved work of breathing.  This is a short acting medication that can be used every 4-6 hours as needed for increased work of breathing, shortness of breath, wheezing and excessive coughing.  It comes in the form of a handheld inhaler or nebulizer solution.  I recommended that for the next 3 to 4 days, this medication is used 4 times daily on a scheduled basis then decrease to twice daily and as needed until symptoms have completely resolved which I anticipate will be several weeks.        If symptoms have not meaningfully improved in the next 3 to 5 days, please return for repeat evaluation or follow-up with your regular provider.  If symptoms have worsened in the next 3 to 5 days, please return for repeat evaluation or follow-up with your regular provider.        Thank you for visiting [facility] today.  We appreciate the opportunity to participate in your care.       Disposition Upon Discharge:  Condition: stable for discharge home  Patient presented with an acute illness with associated systemic symptoms and significant discomfort requiring urgent management. In my opinion, this is  a condition that a prudent lay person (someone who possesses an average knowledge of health and medicine) may potentially expect to result in complications if not addressed urgently such as respiratory distress, impairment of bodily function or dysfunction of bodily organs.   Routine symptom specific, illness specific and/or disease specific instructions were discussed with the patient and/or caregiver at length.   As such, the patient has been evaluated and assessed, work-up was performed and treatment was provided in alignment with urgent care protocols and evidence based medicine.  Patient/parent/caregiver has been advised that the patient may require follow up for further testing and treatment if  the symptoms continue in spite of treatment, as clinically indicated and appropriate.  Patient/parent/caregiver has been advised to return to the [facility] or PCP if no better; to PCP or the Emergency Department if new signs and symptoms develop, or if the current signs or symptoms continue to change or worsen for further workup, evaluation and treatment as clinically indicated and appropriate  The patient will follow up with their current PCP if and as advised. If the patient does not currently have a PCP we will assist them in obtaining one.   The patient may need specialty follow up if the symptoms continue, in spite of conservative treatment and management, for further workup, evaluation, consultation and treatment as clinically indicated and appropriate.  Patient/parent/caregiver verbalized understanding and agreement of plan as discussed.  All questions were addressed during visit.  Please see discharge instructions below for further details of plan.  This office note has been dictated using Teaching laboratory technician.  Unfortunately, this method of dictation can sometimes lead to typographical or grammatical errors.  I apologize for your inconvenience in advance if this occurs.  Please do not hesitate to reach out to me if clarification is needed.       [1]  Social History Tobacco Use   Smoking status: Every Day    Current packs/day: 0.50    Types: Cigarettes    Passive exposure: Current   Smokeless tobacco: Never  Vaping Use   Vaping status: Former  Substance Use Topics   Alcohol use: No   Drug use: Yes    Types: Marijuana     Joesph Shaver Scales, PA-C 04/02/24 1318

## 2024-04-02 NOTE — ED Triage Notes (Addendum)
 States started albuterol  and promethazine -DM 3 days ago following [facility] visit for URI. States started the following day with pruritus to face and lips, and then yesterday breaking out on forehead and woke up with bilat eye swelling. Denies any change in sensation in throat -- has a sore throat from URI, but no change since starting meds. States applied Benadryl  cream to face last night.

## 2024-04-02 NOTE — Discharge Instructions (Addendum)
 Please read below to learn more about the medications, dosages and frequencies that I recommend to help alleviate your symptoms and to get you feeling better soon:                      Solu-Medrol  IM (methylprednisolone ):  To quickly address your significant respiratory inflammation, you were provided with an injection of Solu-Medrol  in the office today.  You should continue to feel the full benefit of the steroid for the next 4 to 6 hours.                 Xyzal  (levocetirizine): This is an excellent second-generation antihistamine that helps to reduce respiratory inflammatory response to environmental allergens.  In some patients, this medication can cause daytime sleepiness so I recommend that you take 1 tablet daily at bedtime.                                 Flonase  (fluticasone ): This is a steroid nasal spray that used once daily, 1 spray in each nare.  This works best when used on a daily basis. This medication does not work well if it is only used when you think you need it.  After 3 to 5 days of use, you will notice significant reduction of the inflammation and mucus production that is currently being caused by exposure to allergens, whether seasonal or environmental.  The most common side effect of this medication is nosebleeds.  If you experience a nosebleed, please discontinue use for 1 week, then feel free to resume.  If you find that your insurance will not pay for this medication, please consider a different nasal steroids such as Nasonex (mometasone), or Nasacort (triamcinolone).       ProAir , Ventolin , Proventil  (albuterol ): This inhaled medication contains a short acting beta agonist bronchodilator.  This medication relaxes the smooth muscle of the airway in the lungs.  When these muscles are tight, breathing becomes more constricted.  The result of relaxation of the smooth muscle is increased air movement and improved work of breathing.  This is a short acting medication that can be used  every 4-6 hours as needed for increased work of breathing, shortness of breath, wheezing and excessive coughing.  It comes in the form of a handheld inhaler or nebulizer solution.  I recommended that for the next 3 to 4 days, this medication is used 4 times daily on a scheduled basis then decrease to twice daily and as needed until symptoms have completely resolved which I anticipate will be several weeks.        If symptoms have not meaningfully improved in the next 3 to 5 days, please return for repeat evaluation or follow-up with your regular provider.  If symptoms have worsened in the next 3 to 5 days, please return for repeat evaluation or follow-up with your regular provider.        Thank you for visiting [facility] today.  We appreciate the opportunity to participate in your care.

## 2024-04-04 ENCOUNTER — Other Ambulatory Visit: Payer: Self-pay

## 2024-04-04 ENCOUNTER — Other Ambulatory Visit (HOSPITAL_COMMUNITY): Payer: Self-pay

## 2024-04-06 ENCOUNTER — Encounter: Payer: Medicaid Other | Admitting: Family Medicine

## 2024-04-11 NOTE — Therapy (Deleted)
 " OUTPATIENT PHYSICAL THERAPY TREATMENT    Patient Name: Melinda Padilla MRN: [id number] DOB:10/07/1990, 33 y.o., female Today's Date: 04/11/2024  END OF SESSION:      Past Medical History:  Diagnosis Date   Heart murmur    Past Surgical History:  Procedure Laterality Date   ORIF ANKLE FRACTURE Right 11/26/2020   Procedure: OPEN REDUCTION INTERNAL FIXATION (ORIF) RIGHT LATERAL MALLEOLUS;  Surgeon: Jerri Kay HERO, MD;  Location: [facility];  Service: Orthopedics;  Laterality: Right;   Patient Active Problem List   Diagnosis Date Noted   Nausea and vomiting in adult 04/08/2023   Gastroesophageal reflux disease without esophagitis 04/08/2023   Screening examination for STD (sexually transmitted disease) 03/17/2023   Candidiasis 03/17/2023   Class 1 obesity due to excess calories with body mass index (BMI) of 30.0 to 30.9 in adult 03/17/2023   Symptomatic mammary hypertrophy 02/03/2023   Back pain 02/03/2023   Cerumen debris on tympanic membrane, right 01/20/2023   Encounter for gynecological examination without abnormal finding 01/14/2023   Sensation of plugged ear on right side 01/14/2023   ADHD (attention deficit hyperactivity disorder), combined type 11/20/2022   Anxiety 11/20/2022   Smoking addiction 11/20/2022   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   Tobacco abuse 10/20/2022   Impaired memory 10/20/2022   Class 1 obesity due to excess calories with body mass index (BMI) of 31.0 to 31.9 in adult 10/20/2022   Heart murmur 10/14/2022   Frequent headaches 10/14/2022   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  Rationale for Evaluation and Treatment: Rehabilitation  ONSET DATE: 4 weeks  SUBJECTIVE:   SUBJECTIVE STATEMENT:  No change in symptoms to report.  Has begun to experience R knee pain due to valgus collapse from R pes planus   PERTINENT HISTORY: Melinda Padilla is a 33 year old female with flat feet who presents with medial ankle pain. She was referred by [facility] for evaluation of her ankle pain.   Two weeks ago, she began experiencing pain in the medial aspect of her ankle, specifically behind the medial malleolus. The area is tender, especially when walking, and the pain is localized without symptoms on the lateral side of the ankle. She has flat feet and uses orthotics for support. She engages in physical therapy exercises, including those involving towels, to strengthen the tendon. She recalls hitting her ankle, which caused temporary coldness and pain in her toes, resolving within twenty minutes after sitting down. She noticed an increased pulling sensation in the area following the incident. PAIN:  Are you having pain? Yes: NPRS scale: 5/10 Pain location: medial R ankle Pain description: ache, sore Aggravating factors: prolonged positions Relieving factors: position change  PRECAUTIONS: None  RED FLAGS: None   WEIGHT BEARING RESTRICTIONS: No  FALLS:  Has patient fallen in last 6 months? No  OCCUPATION: custodian  PLOF: Independent  PATIENT GOALS: To manage my ankle symptoms  NEXT MD VISIT: TBD  OBJECTIVE:  Note: Objective measures were completed at Evaluation unless otherwise noted.  DIAGNOSTIC FINDINGS: Ankle X-ray: Internal fixation with plate and screws in place, no displacement, symptoms  on medial side, appears satisfactory (12/22/2023) X-ray of the right ankle show prior plate and screw construct from ORIF  without any complications.  There is degenerative spurring of the ankle  joint and of the  midfoot.  No acute abnormalities.   PATIENT SURVEYS:  LEFS 41/80    MUSCLE LENGTH: deferred  POSTURE: pes planus R with ER  PALPATION: deferred  LOWER EXTREMITY ROM:  Active ROM Right eval Left eval  Hip flexion    Hip extension    Hip abduction    Hip adduction    Hip internal rotation    Hip external rotation    Knee flexion    Knee extension    Ankle dorsiflexion 0/3d   Ankle plantarflexion    Ankle inversion    Ankle eversion     (Blank rows = not tested)  LOWER EXTREMITY MMT:  MMT Right eval Left eval  Hip flexion    Hip extension    Hip abduction    Hip adduction    Hip internal rotation    Hip external rotation    Knee flexion    Knee extension    Ankle dorsiflexion 4-   Ankle plantarflexion 3   Ankle inversion    Ankle eversion     (Blank rows = not tested)  LOWER EXTREMITY SPECIAL TESTS:  Ankle special tests: Tinel's test-Posterior tibialis: positive   FUNCTIONAL TESTS:  30 seconds chair stand test 7  SLS 30s L, 10s R  GAIT: Distance walked: 35ftx2 Assistive device utilized: None Level of assistance: Complete Independence Comments: unremarkable                                                                                                                                TREATMENT: [facility] Adult PT Treatment:                                                DATE: 03/24/24 Therapeutic Exercise: Nustep L4 8 min 60 SPM Neuromuscular re-ed: Standing gastroc stretch w/supination bias 60s x2  B heel raise with tennis ball 15x2 3 way ankle RTB 15x2 Therapeutic Activity: Runners step 6 in 5# KB 15/15 L hip flexion and R trunk rotation against wall 15x2 from blue t-band pad Seated soleus raises 25# DB 50x focus on neutral foot   [facility] Adult PT Treatment:                                                DATE: 03/22/25 Therapeutic Exercise: Nustep L4 8 min 60 SPM Neuromuscular re-ed: Standing gastroc stertch w/supination bias 60s x2  B heel  raise with tennis ball 15x2 3 way ankle RTB 15x2  Therapeutic Activity: Glute medius hip hike 15x B from airex pad L hip flexion and R trunk rotation against wall 15x2 from airex pad Seated soleus raises 25# DB 50x  [facility] Adult PT Treatment:                                                DATE: 03/01/24 Therapeutic Exercise: Nustep L4 8 min Neuromuscular re-ed: Standing gastroc stertch w/supination bias 60s x2  B heel raise with tennis ball 15x2 Therapeutic Activity: Glute medius hip hike 15x B L hip flexion and R trunk rotation against wall 15x Eversion mobs in prone  [facility] Adult PT Treatment:                                                DATE: 02/04/24  Self Care: Additional minutes spent for educating on updated Therapeutic Home Exercise Program as well as comparing current status to condition at start of symptoms. This included exercises focusing on stretching, strengthening, with focus on eccentric aspects. Long term goals include an improvement in range of motion, strength, endurance as well as avoiding reinjury. Patient's frequency would include in 1-2 times a day, 3-5 times a week for a duration of 6-12 weeks. Proper technique shown and discussed handout in great detail. All questions were discussed and addressed.     PATIENT EDUCATION:  Education details: Discussed eval findings, rehab rationale and POC and patient is in agreement  Person educated: Patient Education method: Explanation and Handouts Education comprehension: verbalized understanding and needs further education  HOME EXERCISE PROGRAM: Access Code: [email or web address] URL: [email or web address] Date: 02/04/2024 Prepared by: Reyes Kohut  Exercises - Long Sitting Plantar Fascia Stretch with Towel  - 2 x daily - 5 x weekly - 1 sets - 2 reps - 60s hold - Gastroc Stretch on Wall  - 2 x daily - 5 x weekly - 1 sets - 2 reps - 30s hold - Soleus Stretch on Wall  - 2 x daily - 5 x weekly - 1 sets - 2 reps - 30s  hold - Single Leg Stance  - 2 x daily - 5 x weekly - 1 sets - 2 reps - 30s hold  ASSESSMENT:  CLINICAL IMPRESSION: Has begun to experience R knee pain as a result from valgus collapse from pes planus.  Uncomfortable maintaining neutral posture.  Improved mobility observed during stretching.  Advanced to runners step cuing to limit valgus collapse at R knee. Discussed benefir of more supportive shoes/footwear.    Patient is a 33 y.o. female who was seen today for physical therapy evaluation and treatment for R ankle pain.  Patient presents with decreased ROM into DF, INV/EV functional with hardware in place.  Strength and balance deficits identified.  R toe out noted due to pesplanus and hip tightness, mild discomfort with palpation of posterior tibialis tendon.  OBJECTIVE IMPAIRMENTS: Abnormal gait, decreased balance, decreased knowledge of condition, decreased mobility, decreased ROM, decreased strength, and pain.   ACTIVITY LIMITATIONS: carrying, lifting, sitting, standing, squatting, and stairs  PERSONAL FACTORS: Age, Past/current experiences, and Time since onset of injury/illness/exacerbation are also affecting patient's functional outcome.   REHAB POTENTIAL: Good  CLINICAL DECISION MAKING: Stable/uncomplicated  EVALUATION COMPLEXITY: Moderate   GOALS:  Goals reviewed with patient? No  SHORT TERM GOALS: Target date: 02/25/2024   Patient to demonstrate independence in HEP  Baseline: Access Code: [email or web address] Goal status: INITIAL  2.  15s R SLS  Baseline: 10s Goal status: INITIAL    LONG TERM GOALS: Target date: 03/31/2024    Patient will acknowledge 2/10 pain at least once during episode of care   Baseline: 5/10 Goal status: INITIAL  2.  Patient will score at least 60/80 on FOTO to signify clinically meaningful improvement in functional abilities.   Baseline: 41/80 Goal status: INITIAL  3.  Patient will increase 30s chair stand reps from 7 to 10 with/without arms to  demonstrate and improved functional ability with less pain/difficulty as well as reduce fall risk.  Baseline: 7 Goal status: INITIAL  4.  Increase AROM DF to 8d Baseline: 0d Goal status: INITIAL  5.  Increase R ankle strength to 4/5 Baseline:  Ankle dorsiflexion 4-   Ankle plantarflexion 3    Goal status: INITIAL    PLAN:  PT FREQUENCY: 1-2x/week  PT DURATION: 6 weeks  PLANNED INTERVENTIONS: 97110-Therapeutic exercises, 97530- Therapeutic activity, 97112- Neuromuscular re-education, 97535- Self Care, [id number]- Manual therapy, [phone]- Gait training, Patient/Family education, Balance training, and Stair training  PLAN FOR NEXT SESSION: HEP review and update, manual techniques as appropriate, aerobic tasks, ROM and flexibility activities, strengthening and PREs, TPDN, gait and balance training as needed   For all possible CPT codes, reference the Planned Interventions line above.     Check all conditions that are expected to impact treatment: {Conditions expected to impact treatment:Structural or anatomic abnormalities and Complications related to surgery   If treatment provided at initial evaluation, no treatment charged due to lack of authorization.       Alaric Gladwin M Terrisa Curfman, PT 04/11/2024, 1:07 PM  "

## 2024-04-13 ENCOUNTER — Emergency Department (HOSPITAL_COMMUNITY): Admission: EM | Admit: 2024-04-13 | Discharge: 2024-04-13 | Disposition: A | Discharge status: Home / Self Care

## 2024-04-13 ENCOUNTER — Emergency Department (HOSPITAL_COMMUNITY)

## 2024-04-13 ENCOUNTER — Ambulatory Visit

## 2024-04-13 ENCOUNTER — Other Ambulatory Visit: Payer: Self-pay

## 2024-04-13 DIAGNOSIS — Z91040 Latex allergy status: Secondary | ICD-10-CM | POA: Insufficient documentation

## 2024-04-13 DIAGNOSIS — W010XXA Fall on same level from slipping, tripping and stumbling without subsequent striking against object, initial encounter: Secondary | ICD-10-CM | POA: Insufficient documentation

## 2024-04-13 DIAGNOSIS — Y92512 Supermarket, store or market as the place of occurrence of the external cause: Secondary | ICD-10-CM | POA: Diagnosis not present

## 2024-04-13 DIAGNOSIS — Z7982 Long term (current) use of aspirin: Secondary | ICD-10-CM | POA: Diagnosis not present

## 2024-04-13 DIAGNOSIS — W19XXXA Unspecified fall, initial encounter: Secondary | ICD-10-CM

## 2024-04-13 DIAGNOSIS — M545 Low back pain, unspecified: Secondary | ICD-10-CM | POA: Insufficient documentation

## 2024-04-13 DIAGNOSIS — M25562 Pain in left knee: Secondary | ICD-10-CM | POA: Diagnosis not present

## 2024-04-13 MED ORDER — KETOROLAC TROMETHAMINE 30 MG/ML IJ SOLN
30.0000 mg | Freq: Once | INTRAMUSCULAR | Status: AC
  Administered 2024-04-13: 30 mg via INTRAMUSCULAR
  Filled 2024-04-13: qty 1

## 2024-04-13 NOTE — Therapy (Deleted)
 " OUTPATIENT PHYSICAL THERAPY TREATMENT    Patient Name: Melinda Padilla MRN: [id number] DOB:1990-07-21, 33 y.o., female Today's Date: 04/13/2024  END OF SESSION:      Past Medical History:  Diagnosis Date   Heart murmur    Past Surgical History:  Procedure Laterality Date   ORIF ANKLE FRACTURE Right 11/26/2020   Procedure: OPEN REDUCTION INTERNAL FIXATION (ORIF) RIGHT LATERAL MALLEOLUS;  Surgeon: Jerri Kay HERO, MD;  Location: [facility];  Service: Orthopedics;  Laterality: Right;   Patient Active Problem List   Diagnosis Date Noted   Nausea and vomiting in adult 04/08/2023   Gastroesophageal reflux disease without esophagitis 04/08/2023   Screening examination for STD (sexually transmitted disease) 03/17/2023   Candidiasis 03/17/2023   Class 1 obesity due to excess calories with body mass index (BMI) of 30.0 to 30.9 in adult 03/17/2023   Symptomatic mammary hypertrophy 02/03/2023   Back pain 02/03/2023   Cerumen debris on tympanic membrane, right 01/20/2023   Encounter for gynecological examination without abnormal finding 01/14/2023   Sensation of plugged ear on right side 01/14/2023   ADHD (attention deficit hyperactivity disorder), combined type 11/20/2022   Anxiety 11/20/2022   Smoking addiction 11/20/2022   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   Tobacco abuse 10/20/2022   Impaired memory 10/20/2022   Class 1 obesity due to excess calories with body mass index (BMI) of 31.0 to 31.9 in adult 10/20/2022   Heart murmur 10/14/2022   Frequent headaches 10/14/2022   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  Rationale for Evaluation and Treatment: Rehabilitation  ONSET DATE: 4 weeks  SUBJECTIVE:   SUBJECTIVE STATEMENT:  No change in symptoms to report.  Has begun to experience R knee pain due to valgus collapse from R pes planus   PERTINENT HISTORY: Melinda Padilla is a 33 year old female with flat feet who presents with medial ankle pain. She was referred by [facility] for evaluation of her ankle pain.   Two weeks ago, she began experiencing pain in the medial aspect of her ankle, specifically behind the medial malleolus. The area is tender, especially when walking, and the pain is localized without symptoms on the lateral side of the ankle. She has flat feet and uses orthotics for support. She engages in physical therapy exercises, including those involving towels, to strengthen the tendon. She recalls hitting her ankle, which caused temporary coldness and pain in her toes, resolving within twenty minutes after sitting down. She noticed an increased pulling sensation in the area following the incident. PAIN:  Are you having pain? Yes: NPRS scale: 5/10 Pain location: medial R ankle Pain description: ache, sore Aggravating factors: prolonged positions Relieving factors: position change  PRECAUTIONS: None  RED FLAGS: None   WEIGHT BEARING RESTRICTIONS: No  FALLS:  Has patient fallen in last 6 months? No  OCCUPATION: custodian  PLOF: Independent  PATIENT GOALS: To manage my ankle symptoms  NEXT MD VISIT: TBD  OBJECTIVE:  Note: Objective measures were completed at Evaluation unless otherwise noted.  DIAGNOSTIC FINDINGS: Ankle X-ray: Internal fixation with plate and screws in place, no displacement, symptoms  on medial side, appears satisfactory (12/22/2023) X-ray of the right ankle show prior plate and screw construct from ORIF  without any complications.  There is degenerative spurring of the ankle  joint and of the  midfoot.  No acute abnormalities.   PATIENT SURVEYS:  LEFS 41/80    MUSCLE LENGTH: deferred  POSTURE: pes planus R with ER  PALPATION: deferred  LOWER EXTREMITY ROM:  Active ROM Right eval Left eval  Hip flexion    Hip extension    Hip abduction    Hip adduction    Hip internal rotation    Hip external rotation    Knee flexion    Knee extension    Ankle dorsiflexion 0/3d   Ankle plantarflexion    Ankle inversion    Ankle eversion     (Blank rows = not tested)  LOWER EXTREMITY MMT:  MMT Right eval Left eval  Hip flexion    Hip extension    Hip abduction    Hip adduction    Hip internal rotation    Hip external rotation    Knee flexion    Knee extension    Ankle dorsiflexion 4-   Ankle plantarflexion 3   Ankle inversion    Ankle eversion     (Blank rows = not tested)  LOWER EXTREMITY SPECIAL TESTS:  Ankle special tests: Tinel's test-Posterior tibialis: positive   FUNCTIONAL TESTS:  30 seconds chair stand test 7  SLS 30s L, 10s R  GAIT: Distance walked: 41ftx2 Assistive device utilized: None Level of assistance: Complete Independence Comments: unremarkable                                                                                                                                TREATMENT: [facility] Adult PT Treatment:                                                DATE: 03/24/24 Therapeutic Exercise: Nustep L4 8 min 60 SPM Neuromuscular re-ed: Standing gastroc stretch w/supination bias 60s x2  B heel raise with tennis ball 15x2 3 way ankle RTB 15x2 Therapeutic Activity: Runners step 6 in 5# KB 15/15 L hip flexion and R trunk rotation against wall 15x2 from blue t-band pad Seated soleus raises 25# DB 50x focus on neutral foot   [facility] Adult PT Treatment:                                                DATE: 03/22/25 Therapeutic Exercise: Nustep L4 8 min 60 SPM Neuromuscular re-ed: Standing gastroc stertch w/supination bias 60s x2  B heel  raise with tennis ball 15x2 3 way ankle RTB 15x2  Therapeutic Activity: Glute medius hip hike 15x B from airex pad L hip flexion and R trunk rotation against wall 15x2 from airex pad Seated soleus raises 25# DB 50x  [facility] Adult PT Treatment:                                                DATE: 03/01/24 Therapeutic Exercise: Nustep L4 8 min Neuromuscular re-ed: Standing gastroc stertch w/supination bias 60s x2  B heel raise with tennis ball 15x2 Therapeutic Activity: Glute medius hip hike 15x B L hip flexion and R trunk rotation against wall 15x Eversion mobs in prone  [facility] Adult PT Treatment:                                                DATE: 02/04/24  Self Care: Additional minutes spent for educating on updated Therapeutic Home Exercise Program as well as comparing current status to condition at start of symptoms. This included exercises focusing on stretching, strengthening, with focus on eccentric aspects. Long term goals include an improvement in range of motion, strength, endurance as well as avoiding reinjury. Patient's frequency would include in 1-2 times a day, 3-5 times a week for a duration of 6-12 weeks. Proper technique shown and discussed handout in great detail. All questions were discussed and addressed.     PATIENT EDUCATION:  Education details: Discussed eval findings, rehab rationale and POC and patient is in agreement  Person educated: Patient Education method: Explanation and Handouts Education comprehension: verbalized understanding and needs further education  HOME EXERCISE PROGRAM: Access Code: [email or web address] URL: [email or web address] Date: 02/04/2024 Prepared by: Reyes Kohut  Exercises - Long Sitting Plantar Fascia Stretch with Towel  - 2 x daily - 5 x weekly - 1 sets - 2 reps - 60s hold - Gastroc Stretch on Wall  - 2 x daily - 5 x weekly - 1 sets - 2 reps - 30s hold - Soleus Stretch on Wall  - 2 x daily - 5 x weekly - 1 sets - 2 reps - 30s  hold - Single Leg Stance  - 2 x daily - 5 x weekly - 1 sets - 2 reps - 30s hold  ASSESSMENT:  CLINICAL IMPRESSION: Has begun to experience R knee pain as a result from valgus collapse from pes planus.  Uncomfortable maintaining neutral posture.  Improved mobility observed during stretching.  Advanced to runners step cuing to limit valgus collapse at R knee. Discussed benefir of more supportive shoes/footwear.    Patient is a 33 y.o. female who was seen today for physical therapy evaluation and treatment for R ankle pain.  Patient presents with decreased ROM into DF, INV/EV functional with hardware in place.  Strength and balance deficits identified.  R toe out noted due to pesplanus and hip tightness, mild discomfort with palpation of posterior tibialis tendon.  OBJECTIVE IMPAIRMENTS: Abnormal gait, decreased balance, decreased knowledge of condition, decreased mobility, decreased ROM, decreased strength, and pain.   ACTIVITY LIMITATIONS: carrying, lifting, sitting, standing, squatting, and stairs  PERSONAL FACTORS: Age, Past/current experiences, and Time since onset of injury/illness/exacerbation are also affecting patient's functional outcome.   REHAB POTENTIAL: Good  CLINICAL DECISION MAKING: Stable/uncomplicated  EVALUATION COMPLEXITY: Moderate   GOALS:  Goals reviewed with patient? No  SHORT TERM GOALS: Target date: 02/25/2024   Patient to demonstrate independence in HEP  Baseline: Access Code: [email or web address] Goal status: INITIAL  2.  15s R SLS  Baseline: 10s Goal status: INITIAL    LONG TERM GOALS: Target date: 03/31/2024    Patient will acknowledge 2/10 pain at least once during episode of care   Baseline: 5/10 Goal status: INITIAL  2.  Patient will score at least 60/80 on FOTO to signify clinically meaningful improvement in functional abilities.   Baseline: 41/80 Goal status: INITIAL  3.  Patient will increase 30s chair stand reps from 7 to 10 with/without arms to  demonstrate and improved functional ability with less pain/difficulty as well as reduce fall risk.  Baseline: 7 Goal status: INITIAL  4.  Increase AROM DF to 8d Baseline: 0d Goal status: INITIAL  5.  Increase R ankle strength to 4/5 Baseline:  Ankle dorsiflexion 4-   Ankle plantarflexion 3    Goal status: INITIAL    PLAN:  PT FREQUENCY: 1-2x/week  PT DURATION: 6 weeks  PLANNED INTERVENTIONS: 97110-Therapeutic exercises, 97530- Therapeutic activity, 97112- Neuromuscular re-education, 97535- Self Care, 02859- Manual therapy, [phone]- Gait training, Patient/Family education, Balance training, and Stair training  PLAN FOR NEXT SESSION: HEP review and update, manual techniques as appropriate, aerobic tasks, ROM and flexibility activities, strengthening and PREs, TPDN, gait and balance training as needed   For all possible CPT codes, reference the Planned Interventions line above.     Check all conditions that are expected to impact treatment: {Conditions expected to impact treatment:Structural or anatomic abnormalities and Complications related to surgery   If treatment provided at initial evaluation, no treatment charged due to lack of authorization.       Rasha Ibe M Roper Tolson, PT 04/13/2024, 10:22 AM  "

## 2024-04-13 NOTE — ED Triage Notes (Signed)
 Pt reports fell at [facility] yesterday and was not seen, pt c/o lower back pain that radiates to left knee but stops there, and right ankle pain, all r/t the fall. Pain 4/10, pt tool tylenol  at 9am

## 2024-04-13 NOTE — Discharge Instructions (Signed)
 Thank you for visiting the Emergency Department today. It was a pleasure to be part of your healthcare team.  You were seen today after a fall-your imaging was negative for fracture or dislocation. As discussed, please utilize Tylenol  and ibuprofen  as needed for pain relief.  Also utilize ice, heat, and rest.  It is important to watch for warning signs such as worsening pain or inability to ambulate. If any of these happen, return to the Emergency Department or call 911. Thank you for trusting us  with your health.

## 2024-04-13 NOTE — ED Provider Notes (Signed)
 " [place] [facility]ERGENCY DEPARTMENT AT [facility] Provider Note   CSN: [id number] Arrival date & time: 04/13/24  1000     Patient presents with: Fall and Back Pain   Melinda Padilla is a 33 y.o. female presents to the ED for evaluation after a fall that happened yesterday.  Patient states that she slipped and fell onto her back in the grocery store.  Patient states that she is currently feeling pain in her left knee and lower back.  Patient states that she did not hit her head, denies LOC, denies any anticoagulation use. There is no associated numbness, tingling, weakness, joint instability, deformity, or loss of function.  Patient denies any bowel or bladder incontinence since the event. The patient has tried OTC analgesics for symptom relief with improvement. Patient is in no acute distress.      Fall  Back Pain      Prior to Admission medications  Medication Sig Start Date End Date Taking? Authorizing Provider  albuterol  (VENTOLIN  HFA) 108 (90 Base) MCG/ACT inhaler Inhale 2 puffs into the lungs every 6 (six) hours as needed for wheezing or shortness of breath. 03/30/24   Enedelia Dorna HERO, FNP  ASPIRIN  81 PO Take by mouth.    [provider]  fluticasone  (FLONASE ) 50 MCG/ACT nasal spray Place 1 spray into both nostrils daily. Begin by using 2 sprays in each nare daily for 3 to 5 days, then decrease to 1 spray in each nare daily. 04/02/24   Joesph Shaver Scales, PA-C  levocetirizine (XYZAL ) 5 MG tablet Take 1 tablet (5 mg total) by mouth every evening. 04/02/24 07/01/24  Joesph Shaver Scales, PA-C    Allergies: Latex and Dextromethorphan    Review of Systems  Musculoskeletal:  Positive for back pain.    Updated Vital Signs BP (!) 117/101 (BP Location: Right Arm)   Pulse 99   Temp 98.1 F (36.7 C) (Oral)   Resp 18   Ht 5' 10 (1.778 m)   Wt 95.3 kg   LMP 03/25/2024 (Approximate)   SpO2 99%   BMI 30.13 kg/m   Physical Exam Vitals and nursing  note reviewed.  Constitutional:      General: She is not in acute distress.    Appearance: Normal appearance. She is not toxic-appearing.  HENT:     Head: Normocephalic and atraumatic.  Eyes:     Extraocular Movements: Extraocular movements intact.     Conjunctiva/sclera: Conjunctivae normal.     Pupils: Pupils are equal, round, and reactive to light.  Cardiovascular:     Rate and Rhythm: Normal rate and regular rhythm.     Pulses: Normal pulses.  Pulmonary:     Effort: Pulmonary effort is normal. No respiratory distress.  Abdominal:     General: Abdomen is flat.     Palpations: Abdomen is soft.     Tenderness: There is no abdominal tenderness.  Musculoskeletal:     Cervical back: Normal and normal range of motion.     Thoracic back: Normal.     Lumbar back: Tenderness present. No deformity, signs of trauma or bony tenderness. Normal range of motion.     Left knee: No swelling, deformity, effusion, erythema, bony tenderness or crepitus. Normal range of motion. Tenderness present over the medial joint line and lateral joint line. No LCL laxity, MCL laxity, ACL laxity or PCL laxity.Normal alignment. Normal pulse.     Comments: Tenderness to palpation to lumbar paraspinal muscles and SI joints bilaterally. No bony  tenderness, signs of obvious trauma, step-offs, or deformity. Tenderness to palpation to left knee.  No bony tenderness. No effusion, obvious deformity, or laxity.  Skin:    General: Skin is warm and dry.     Capillary Refill: Capillary refill takes less than 2 seconds.  Neurological:     General: No focal deficit present.     Mental Status: She is alert. Mental status is at baseline.  Psychiatric:        Mood and Affect: Mood normal.     (all labs ordered are listed, but only abnormal results are displayed) Labs Reviewed - No data to display  EKG: None  Radiology: No results found.   Procedures   Medications Ordered in the ED  ketorolac  (TORADOL ) 30 MG/ML  injection 30 mg (has no administration in time range)                                 Medical Decision Making Amount and/or Complexity of Data Reviewed Radiology: ordered.  Risk Prescription drug management.   Patient presents to the ED for: Evaluation after a fall This involves an extensive number of treatment options  Differential diagnosis includes:  Minor MSK etiology Traumatic etiology Co-morbid conditions: None  Clinical Course as of 04/18/24 1535  Wed Apr 13, 2024  1135 Temp: 98.1 F (36.7 C) Afebrile, vital stable, patient in no acute distress [ML]  1200 Patient given Toradol  30 mg IM injection for pain relief [ML]  1300 DG Knee Complete 4 Views Left No acute findings [ML]  1300 DG Lumbar Spine Complete No acute findings [ML]    Clinical Course User Index [ML] [name], PA    Data Reviewed / Actions Taken: Imaging ordered/reviewed with my independent interpretation in ED course above. I agree with the radiologists interpretation.   Management / Treatments: See ED course above for medications, treatments administered, and clinical rationale.   I have reviewed the patients home medicines and have made adjustments as needed  ED Course / Reassessments: Problem List:  32 year old female presented for evaluation after a fall. Initial assessment included history, physical exam, and review of prior medical records. Based on the reassuring exam findings and low risk mechanism, there was low clinical suspicion for fracture, dislocation, or neurovascular injury.  Imaging demonstrated no acute osseous abnormality. The patient was treated conservatively with analgesia and supportive care, with no worsening of symptoms during ED course.  The patient was deemed appropriate for discharge with a diagnosis of likely musculoskeletal strain/sprain.  Discharge instructions including rest, activity modification, ice/heat as appropriate, and use of NSAIDs or acetaminophen  for pain  control.  The patient was advised to follow-up with primary care if symptoms persist or worsen.  Strict return precautions were provided for increasing pain, swelling, numbness, weakness or loss of function.     Disposition: Disposition: Discharge with close follow-up with PCP for further eval and care  Rationale for disposition: stable for discharge  The disposition plan and rationale were discussed with the patient at the bedside, all questions were addressed, and the patient demonstrated understanding.  This note was produced using Electronics Engineer. While I have reviewed and verified all clinical information, transcription errors may remain.      Final diagnoses:  None    ED Discharge Orders     None          [name], [place] 04/19/24 [phone]  Neysa Caron PARAS, DO 04/28/24 [phone]  "

## 2024-04-18 ENCOUNTER — Ambulatory Visit

## 2024-05-02 ENCOUNTER — Ambulatory Visit: Admitting: Adult Health

## 2024-05-02 ENCOUNTER — Encounter: Payer: Self-pay | Admitting: Adult Health

## 2024-05-02 ENCOUNTER — Other Ambulatory Visit (HOSPITAL_COMMUNITY): Payer: Self-pay

## 2024-05-02 VITALS — BP 113/76 | HR 79 | Ht 70.0 in | Wt 219.8 lb

## 2024-05-02 DIAGNOSIS — I7774 Dissection of vertebral artery: Secondary | ICD-10-CM

## 2024-05-02 DIAGNOSIS — R4189 Other symptoms and signs involving cognitive functions and awareness: Secondary | ICD-10-CM

## 2024-05-02 DIAGNOSIS — M542 Cervicalgia: Secondary | ICD-10-CM | POA: Diagnosis not present

## 2024-05-02 DIAGNOSIS — Z9189 Other specified personal risk factors, not elsewhere classified: Secondary | ICD-10-CM | POA: Diagnosis not present

## 2024-05-02 DIAGNOSIS — G43711 Chronic migraine without aura, intractable, with status migrainosus: Secondary | ICD-10-CM | POA: Diagnosis not present

## 2024-05-02 DIAGNOSIS — G4452 New daily persistent headache (NDPH): Secondary | ICD-10-CM

## 2024-05-02 MED ORDER — EMGALITY 120 MG/ML ~~LOC~~ SOAJ
120.0000 mg | SUBCUTANEOUS | 3 refills | Status: AC
  Filled 2024-05-02: qty 3, 90d supply, fill #0

## 2024-05-02 MED ORDER — RIZATRIPTAN BENZOATE 10 MG PO TBDP
10.0000 mg | ORAL_TABLET | ORAL | 11 refills | Status: AC | PRN
  Filled 2024-05-02: qty 9, 30d supply, fill #0
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
## 2024-05-02 NOTE — Patient Instructions (Addendum)
 Your Plan:  Start Emgality  monthly injection - initial injection provided today. Please ensure you repeat the injection in 30 days. Continue these for at least months. If headaches persist, please let me know and we can consider alternative options  Start Maxalt  as needed for migraine rescue - take at onset of migraine headache, can repeat after 2 hours if needed  Continue to follow with psychiatry and psychology as scheduled  You will be called to complete a home sleep study to rule out sleep apnea as this could be contributing to your frequent headaches      Follow up in 4 months or call earlier if needed     Thank you for coming to see us  at [facility]. I hope we have been able to provide you high quality care today.  You may receive a patient satisfaction survey over the next few weeks. We would appreciate your feedback and comments so that we may continue to improve ourselves and the health of our patients.

## 2024-05-03 ENCOUNTER — Encounter: Payer: Self-pay | Admitting: Family Medicine

## 2024-05-11 ENCOUNTER — Ambulatory Visit (INDEPENDENT_AMBULATORY_CARE_PROVIDER_SITE_OTHER): Admitting: Neurology

## 2024-05-11 DIAGNOSIS — I7774 Dissection of vertebral artery: Secondary | ICD-10-CM

## 2024-05-11 DIAGNOSIS — Z9189 Other specified personal risk factors, not elsewhere classified: Secondary | ICD-10-CM

## 2024-05-11 DIAGNOSIS — G4733 Obstructive sleep apnea (adult) (pediatric): Secondary | ICD-10-CM

## 2024-05-11 DIAGNOSIS — Z720 Tobacco use: Secondary | ICD-10-CM

## 2024-05-12 ENCOUNTER — Other Ambulatory Visit (HOSPITAL_COMMUNITY): Payer: Self-pay

## 2024-05-13 NOTE — Progress Notes (Signed)
 "      [facility]   HOME SLEEP TEST REPORT ( by Watch PAT)   STUDY DATE:  Return of data 05-13-2024   DOB:   MRN:    ORDERING CLINICIAN:  McCue, NP Stroke Team REFERRING CLINICIAN: STROKE MD Dr Rosemarie / PCP : Bruna Creighton, NP   CLINICAL INFORMATION/HISTORY:  patient with documented vertebral artery disease, vascular headaches, PTSD,  now chronic migraines followed by NP McCue, Primary neurologist: Dr. 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  At prior visit, she was started on Emgality  and received initial loading dose via sample at visit but she did not complete any additional injections as she was not aware this needed to be continued.  She continues to have headaches almost daily which can be bitemporal, parietal and occipital as well as occasionally behind her eyes.  She can have more of a severe debilitating migraine about once per week which can last several hours.  She never tried rizatriptan  or gabapentin  as previously prescribed.  She was seen by [facility] eye care Dr. Jacques last month, unable to view report, per patient vision good but there was something behind my right eye.  She was evaluated by [facility] behavioral medicine for persistent difficulties with memory, attention at time management with prior diagnosis of ADHD, note reviewed, suspected symptoms more related to PTSD and mood dysregulation although underlying attention deficit may be present.  She was recently started on fluoxetine and Vyvanse by behavioral health.  She does report chronic insomnia with frequent nighttime awakenings, daytime fatigue, and has been told that she snores and has witnessed apneas.  She occasionally wakes with morning headache.  She never previously underwent sleep study.  No new stroke/TIA symptoms.  Compliant on aspirin  81 mg daily.  Routinely follows with PCP. BMI: 31.4  kg/m   Epworth sleepiness  score: X/24. FSS at X/ 63 points   Neck Circumference: NA   FINDINGS:   Sleep Summary:   Total Recording Time (hours, min):    8 h 11 minutes    Total Sleep Time (hours, min):   7 h 31 minutes              Percent REM (%):   24.3%                                      Respiratory Indices:   Calculated pAHI (per hour, CMS based ) :  12.7/h    REM AHI was  4.6/h versus NREM AHI of  0.2/h                                            Positional AHI:   AHI in supine was the highest at 2.3/h   Minor and intermittent snoring , mean volume was 41 dB.                                             Oxygen Saturation Statistics:   Oxygen Saturation (%) Mean:    95%            O2 Saturation Range (%):  90-98 %                                      O2 Saturation (minutes) <89%:     0 minutes     Pulse Rate Statistics:   Pulse Mean (bpm):   66 bpm              Pulse Range:   54-101 bpm  Caveat: this Watch Pat device does not provide data of cardiac rhythm.              IMPRESSION:  This HST  indicates that neither sleep apnea nor sleep hypoxia were present.    RECOMMENDATION: No sleep related contribution to this patient's headaches was found.     Sleep fragmentation in the presence of normal proportional sleep stages is a nonspecific findings and per se does not signify an intrinsic sleep disorder or a cause for the patient's sleep-related symptoms.  Causes include (but are not limited to) the unfamiliarity of sleeping while recorded by HST device or sleeping in a sleep lab for a full Polysomnography sleep study, but also circadian rhythm disturbances, medication side effects or an underlying mood disorder or medical problem.     INTERPRETING PHYSICIAN:   Dedra Gores, MD                         "

## 2024-05-13 NOTE — Patient Instructions (Signed)
 SABRA

## 2024-05-17 NOTE — Therapy (Unsigned)
 " OUTPATIENT PHYSICAL THERAPY TREATMENT    Patient Name: Melinda Padilla MRN: [id number] DOB:Jan 06, 1991, 34 y.o., female Today's Date: 05/18/2024  END OF SESSION:  PT End of Session - 05/18/24 1613     Visit Number 5    Number of Visits 12    Date for Recertification  04/05/24    Authorization Type MCD    Authorization Time Period approved 6 PT visits from 02/23/24-04/05/24    Authorization - Number of Visits 6    PT Start Time 1615    Activity Tolerance Patient tolerated treatment well    Behavior During Therapy [facility] for tasks assessed/performed             Past Medical History:  Diagnosis Date   Heart murmur    Past Surgical History:  Procedure Laterality Date   ORIF ANKLE FRACTURE Right 11/26/2020   Procedure: OPEN REDUCTION INTERNAL FIXATION (ORIF) RIGHT LATERAL MALLEOLUS;  Surgeon: Jerri Kay HERO, MD;  Location: [facility];  Service: Orthopedics;  Laterality: Right;   Patient Active Problem List   Diagnosis Date Noted   Nausea and vomiting in adult 04/08/2023   Gastroesophageal reflux disease without esophagitis 04/08/2023   Screening examination for STD (sexually transmitted disease) 03/17/2023   Candidiasis 03/17/2023   Class 1 obesity due to excess calories with body mass index (BMI) of 30.0 to 30.9 in adult 03/17/2023   Symptomatic mammary hypertrophy 02/03/2023   Back pain 02/03/2023   Cerumen debris on tympanic membrane, right 01/20/2023   Encounter for gynecological examination without abnormal finding 01/14/2023   Sensation of plugged ear on right side 01/14/2023   ADHD (attention deficit hyperactivity disorder), combined type 11/20/2022   Anxiety 11/20/2022   Smoking addiction 11/20/2022   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   Tobacco abuse 10/20/2022   Impaired memory 10/20/2022   Class 1 obesity due to excess calories with body mass index (BMI) of 31.0 to 31.9 in adult 10/20/2022   Heart murmur 10/14/2022    Frequent headaches 10/14/2022   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   Recurrent epistaxis 08/19/2022   Displaced fracture of lateral malleolus of right fibula, initial encounter for closed fracture     PCP: Petrina Pries, NP   REFERRING PROVIDER: Jerri Kay HERO, MD  REFERRING DIAG: [phone] (ICD-10-CM) - Acute right ankle pain M76.821 (ICD-10-CM) - Posterior tibial tendon dysfunction (PTTD) of right lower extremity  THERAPY DIAG:  Pain in right ankle and joints of right foot  Posterior tibial tendinitis of right lower extremity  Muscle weakness (generalized)  Rationale for Evaluation and Treatment: Rehabilitation  ONSET DATE: 4 weeks  SUBJECTIVE:   SUBJECTIVE STATEMENT:  Symptoms persist as soreness, 4/10 in intensity.   PERTINENT HISTORY: Melinda Padilla is a 34 year old female with flat feet who presents with medial ankle pain. She was referred by [facility] for evaluation of her ankle pain.   Two weeks ago, she began experiencing pain in the medial aspect of her ankle, specifically behind the medial malleolus. The area is tender, especially when walking, and the pain is localized without symptoms on the lateral side of the ankle. She has flat feet and uses orthotics for support. She engages in physical therapy exercises, including those involving towels, to strengthen the tendon. She recalls hitting her ankle, which caused temporary coldness and pain in her toes, resolving within twenty minutes after sitting down. She noticed an increased pulling sensation in the area following the incident. PAIN:  Are you having pain?  Yes: NPRS scale: 5/10 Pain location: medial R ankle Pain description: ache, sore Aggravating factors: prolonged positions Relieving factors: position change  PRECAUTIONS: None  RED FLAGS: None   WEIGHT BEARING RESTRICTIONS: No  FALLS:  Has patient fallen in last 6 months? No  OCCUPATION: custodian  PLOF: Independent  PATIENT GOALS: To manage my ankle  symptoms  NEXT MD VISIT: TBD  OBJECTIVE:  Note: Objective measures were completed at Evaluation unless otherwise noted.  DIAGNOSTIC FINDINGS: Ankle X-ray: Internal fixation with plate and screws in place, no displacement, symptoms on medial side, appears satisfactory (12/22/2023) X-ray of the right ankle show prior plate and screw construct from ORIF  without any complications.  There is degenerative spurring of the ankle  joint and of the midfoot.  No acute abnormalities.   PATIENT SURVEYS:  LEFS 41/80    MUSCLE LENGTH: deferred  POSTURE: pes planus R with ER  PALPATION: deferred  LOWER EXTREMITY ROM:  A/PROM Right eval Left eval 05/18/24  Hip flexion     Hip extension     Hip abduction     Hip adduction     Hip internal rotation     Hip external rotation     Knee flexion     Knee extension     Ankle dorsiflexion 0/3d  0/2d  Ankle plantarflexion   WNL  Ankle inversion   25/30d  Ankle eversion   12/20d   (Blank rows = not tested)  LOWER EXTREMITY MMT:  MMT Right eval Left eval  Hip flexion    Hip extension    Hip abduction    Hip adduction    Hip internal rotation    Hip external rotation    Knee flexion    Knee extension    Ankle dorsiflexion 4-   Ankle plantarflexion 3   Ankle inversion    Ankle eversion     (Blank rows = not tested)  LOWER EXTREMITY SPECIAL TESTS:  Ankle special tests: Tinel's test-Posterior tibialis: positive   FUNCTIONAL TESTS:  30 seconds chair stand test 7  SLS 30s L, 10s R; 05/18/24 21s  GAIT: Distance walked: 60ftx2 Assistive device utilized: None Level of assistance: Complete Independence Comments: unremarkable                                                                                                                                TREATMENT: [facility] Adult PT Treatment:                                                DATE: 05/18/24  Therapeutic Activity: Re-assessment of goal progress, LEFS, HEP review and  update  [facility] Adult PT Treatment:  DATE: 03/24/24 Therapeutic Exercise: Nustep L4 8 min 60 SPM Neuromuscular re-ed: Standing gastroc stretch w/supination bias 60s x2  B heel raise with tennis ball 15x2 3 way ankle RTB 15x2 Therapeutic Activity: Runners step 6 in 5# KB 15/15 L hip flexion and R trunk rotation against wall 15x2 from blue t-band pad Seated soleus raises 25# DB 50x focus on neutral foot   [facility] Adult PT Treatment:                                                DATE: 03/22/25 Therapeutic Exercise: Nustep L4 8 min 60 SPM Neuromuscular re-ed: Standing gastroc stertch w/supination bias 60s x2  B heel raise with tennis ball 15x2 3 way ankle RTB 15x2 Therapeutic Activity: Glute medius hip hike 15x B from airex pad L hip flexion and R trunk rotation against wall 15x2 from airex pad Seated soleus raises 25# DB 50x  [facility] Adult PT Treatment:                                                DATE: 03/01/24 Therapeutic Exercise: Nustep L4 8 min Neuromuscular re-ed: Standing gastroc stertch w/supination bias 60s x2  B heel raise with tennis ball 15x2 Therapeutic Activity: Glute medius hip hike 15x B L hip flexion and R trunk rotation against wall 15x Eversion mobs in prone  [facility] Adult PT Treatment:                                                DATE: 02/04/24  Self Care: Additional minutes spent for educating on updated Therapeutic Home Exercise Program as well as comparing current status to condition at start of symptoms. This included exercises focusing on stretching, strengthening, with focus on eccentric aspects. Long term goals include an improvement in range of motion, strength, endurance as well as avoiding reinjury. Patient's frequency would include in 1-2 times a day, 3-5 times a week for a duration of 6-12 weeks. Proper technique shown and discussed handout in great detail. All questions were discussed and addressed.      PATIENT EDUCATION:  Education details: Discussed eval findings, rehab rationale and POC and patient is in agreement  Person educated: Patient Education method: Explanation and Handouts Education comprehension: verbalized understanding and needs further education  HOME EXERCISE PROGRAM: Access Code: [email or web address] URL: [email or web address] Date: 05/18/2024 Prepared by: Reyes Kohut  Exercises - Gastroc Stretch on Wall  - 2 x daily - 5 x weekly - 1 sets - 2 reps - 30s hold - Soleus Stretch on Wall  - 2 x daily - 5 x weekly - 1 sets - 2 reps - 30s hold - Standing Bilateral Gastroc Stretch with Step  - 2 x daily - 5 x weekly - 1 sets - 2 reps - 60s hold - Heel Toe Raises with Counter Support  - 2 x daily - 5 x weekly - 2 sets - 15 reps  ASSESSMENT:  CLINICAL IMPRESSION: Progress made towards pain/soreness as well as static balance and strength via 30s chair stand test.  DF ROM unchanged and HEP updated to  emphasize stretch.  Recommend 1w4 to improve DF ROM and consider night splint if stretching unsuccessful.  Patient is a 34 y.o. female who was seen today for physical therapy evaluation and treatment for R ankle pain.  Patient presents with decreased ROM into DF, INV/EV functional with hardware in place.  Strength and balance deficits identified.  R toe out noted due to pesplanus and hip tightness, mild discomfort with palpation of posterior tibialis tendon.  OBJECTIVE IMPAIRMENTS: Abnormal gait, decreased balance, decreased knowledge of condition, decreased mobility, decreased ROM, decreased strength, and pain.   ACTIVITY LIMITATIONS: carrying, lifting, sitting, standing, squatting, and stairs  PERSONAL FACTORS: Age, Past/current experiences, and Time since onset of injury/illness/exacerbation are also affecting patient's functional outcome.   REHAB POTENTIAL: Good  CLINICAL DECISION MAKING: Stable/uncomplicated  EVALUATION COMPLEXITY: Moderate   GOALS: Goals  reviewed with patient? No  SHORT TERM GOALS: Target date: 02/25/2024   Patient to demonstrate independence in HEP  Baseline: Access Code: [email or web address] Goal status: Met  2.  15s R SLS  Baseline: 10s; 05/18/24 21s Goal status: Met    LONG TERM GOALS: Target date: 07/16/24    Patient will acknowledge 2/10 pain at least once during episode of care   Baseline: 5/10; 05/18/24 4/10 Goal status: Ongoing  2.  Patient will score at least 60/80 on LEFS to signify clinically meaningful improvement in functional abilities.   Baseline: 41/80; 52/80 Goal status: Ongoing  3.  Patient will increase 30s chair stand reps from 7 to 10 without arms to demonstrate and improved functional ability with less pain/difficulty as well as reduce fall risk.  Baseline: 7; 05/18/24 11 Goal status: Met  4.  Increase AROM DF to 8d Baseline: 0d; 05/18/24 0d Goal status: Ongoing  5.  Increase R ankle strength to 4/5 Baseline:  Ankle dorsiflexion 4-   Ankle plantarflexion 3    Goal status: ongoing    PLAN:  PT FREQUENCY: 1x/week  PT DURATION: 4 weeks  PLANNED INTERVENTIONS: 97110-Therapeutic exercises, 97530- Therapeutic activity, 97112- Neuromuscular re-education, 97535- Self Care, [id number]- Manual therapy, [phone]- Gait training, Patient/Family education, Balance training, and Stair training  PLAN FOR NEXT SESSION: HEP review and update, manual techniques as appropriate, aerobic tasks, ROM and flexibility activities, strengthening and PREs, TPDN, gait and balance training as needed   For all possible CPT codes, reference the Planned Interventions line above.     Check all conditions that are expected to impact treatment: {Conditions expected to impact treatment:Structural or anatomic abnormalities and Complications related to surgery   If treatment provided at initial evaluation, no treatment charged due to lack of authorization.       Gardy Montanari M Cordale Manera, PT 05/18/2024, 4:14 PM  "

## 2024-05-18 ENCOUNTER — Ambulatory Visit: Attending: Orthopaedic Surgery

## 2024-05-18 DIAGNOSIS — M76821 Posterior tibial tendinitis, right leg: Secondary | ICD-10-CM | POA: Insufficient documentation

## 2024-05-18 DIAGNOSIS — M6281 Muscle weakness (generalized): Secondary | ICD-10-CM | POA: Insufficient documentation

## 2024-05-18 DIAGNOSIS — M25571 Pain in right ankle and joints of right foot: Secondary | ICD-10-CM | POA: Insufficient documentation

## 2024-05-19 NOTE — Procedures (Addendum)
 "      [facility]   HOME SLEEP TEST REPORT ( by Watch PAT)   STUDY DATE:  Return of data 05-13-2024   DOB:   MRN:    ORDERING CLINICIAN:  McCue, NP Stroke Team REFERRING CLINICIAN: STROKE MD Dr Rosemarie / PCP : Bruna Creighton, NP   CLINICAL INFORMATION/HISTORY:  patient with documented vertebral artery disease, vascular headaches, PTSD,  now chronic migraines followed by NP McCue, Primary neurologist: Dr. 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  At prior visit, she was started on Emgality  and received initial loading dose via sample at visit but she did not complete any additional injections as she was not aware this needed to be continued.  She continues to have headaches almost daily which can be bitemporal, parietal and occipital as well as occasionally behind her eyes.  She can have more of a severe debilitating migraine about once per week which can last several hours.  She never tried rizatriptan  or gabapentin  as previously prescribed.  She was seen by [facility] eye care Dr. Jacques last month, unable to view report, per patient vision good but there was something behind my right eye.  She was evaluated by [facility] behavioral medicine for persistent difficulties with memory, attention at time management with prior diagnosis of ADHD, note reviewed, suspected symptoms more related to PTSD and mood dysregulation although underlying attention deficit may be present.  She was recently started on fluoxetine and Vyvanse by behavioral health.  She does report chronic insomnia with frequent nighttime awakenings, daytime fatigue, and has been told that she snores and has witnessed apneas.  She occasionally wakes with morning headache.  She never previously underwent sleep study.  No new stroke/TIA symptoms.  Compliant on aspirin  81 mg daily.  Routinely follows with PCP. BMI: 31.4  kg/m   Epworth sleepiness  score: X/24. FSS at X/ 63 points   Neck Circumference: NA   FINDINGS:   Sleep Summary:   Total Recording Time (hours, min):    8 h 11 minutes    Total Sleep Time (hours, min):   7 h 31 minutes              Percent REM (%):   24.3%                                      Respiratory Indices:   Calculated pAHI (per hour, CMS based ) :  1.2/h    REM AHI was  4.6/h versus NREM AHI of  0.2/h                                            Positional AHI:   AHI in supine was the highest at 2.3/h   Minor and intermittent snoring , mean volume was 41 dB.                                             Oxygen Saturation Statistics:   Oxygen Saturation (%) Mean:    95%            O2 Saturation Range (%):  90-98 %                                      O2 Saturation (minutes) <89%:     0 minutes     Pulse Rate Statistics:   Pulse Mean (bpm):   66 bpm              Pulse Range:   54-101 bpm  Caveat: this Watch Pat device does not provide data of cardiac rhythm.              IMPRESSION:  This HST  indicates that neither sleep apnea nor sleep hypoxia were present.    RECOMMENDATION: No sleep related contribution to this patient's headaches was found.     Sleep fragmentation in the presence of normal proportional sleep stages is a nonspecific findings and per se does not signify an intrinsic sleep disorder or a cause for the patient's sleep-related symptoms.  Causes include (but are not limited to) the unfamiliarity of sleeping while recorded by HST device or sleeping in a sleep lab for a full Polysomnography sleep study, but also circadian rhythm disturbances, medication side effects or an underlying mood disorder or medical problem.     INTERPRETING PHYSICIAN:   Dedra Gores, MD    Addendum written on 05-25-2024, correcting AHI to 1.2/h . Dedra Gores, MD                     "

## 2024-05-24 ENCOUNTER — Ambulatory Visit: Payer: Self-pay | Admitting: Adult Health

## 2024-05-27 NOTE — Therapy (Unsigned)
 " OUTPATIENT PHYSICAL THERAPY TREATMENT    Patient Name: Melinda Padilla MRN: [id number] DOB:1990-11-04, 34 y.o., female Today's Date: 05/27/2024  END OF SESSION:       Past Medical History:  Diagnosis Date   Heart murmur    Past Surgical History:  Procedure Laterality Date   ORIF ANKLE FRACTURE Right 11/26/2020   Procedure: OPEN REDUCTION INTERNAL FIXATION (ORIF) RIGHT LATERAL MALLEOLUS;  Surgeon: Jerri Kay HERO, MD;  Location: [facility];  Service: Orthopedics;  Laterality: Right;   Patient Active Problem List   Diagnosis Date Noted   Nausea and vomiting in adult 04/08/2023   Gastroesophageal reflux disease without esophagitis 04/08/2023   Screening examination for STD (sexually transmitted disease) 03/17/2023   Candidiasis 03/17/2023   Class 1 obesity due to excess calories with body mass index (BMI) of 30.0 to 30.9 in adult 03/17/2023   Symptomatic mammary hypertrophy 02/03/2023   Back pain 02/03/2023   Cerumen debris on tympanic membrane, right 01/20/2023   Encounter for gynecological examination without abnormal finding 01/14/2023   Sensation of plugged ear on right side 01/14/2023   ADHD (attention deficit hyperactivity disorder), combined type 11/20/2022   Anxiety 11/20/2022   Smoking addiction 11/20/2022   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   Tobacco abuse 10/20/2022   Impaired memory 10/20/2022   Class 1 obesity due to excess calories with body mass index (BMI) of 31.0 to 31.9 in adult 10/20/2022   Heart murmur 10/14/2022   Frequent headaches 10/14/2022   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  Rationale for Evaluation and Treatment: Rehabilitation  ONSET DATE: 4 weeks  SUBJECTIVE:   SUBJECTIVE STATEMENT:  Symptoms persist as soreness, 4/10 in intensity.   PERTINENT HISTORY: Melinda Padilla is a 34 year old female with flat feet who presents with medial ankle pain. She was referred by [facility] for evaluation of her ankle pain.   Two weeks ago, she began experiencing pain in the medial aspect of her ankle, specifically behind the medial malleolus. The area is tender, especially when walking, and the pain is localized without symptoms on the lateral side of the ankle. She has flat feet and uses orthotics for support. She engages in physical therapy exercises, including those involving towels, to strengthen the tendon. She recalls hitting her ankle, which caused temporary coldness and pain in her toes, resolving within twenty minutes after sitting down. She noticed an increased pulling sensation in the area following the incident. PAIN:  Are you having pain? Yes: NPRS scale: 5/10 Pain location: medial R ankle Pain description: ache, sore Aggravating factors: prolonged positions Relieving factors: position change  PRECAUTIONS: None  RED FLAGS: None   WEIGHT BEARING RESTRICTIONS: No  FALLS:  Has patient fallen in last 6 months? No  OCCUPATION: custodian  PLOF: Independent  PATIENT GOALS: To manage my ankle symptoms  NEXT MD VISIT: TBD  OBJECTIVE:  Note: Objective measures were completed at Evaluation unless otherwise noted.  DIAGNOSTIC FINDINGS: Ankle X-ray: Internal fixation with plate and screws in place, no displacement, symptoms on medial side, appears satisfactory (12/22/2023) X-ray of the right ankle show prior plate  and screw construct from ORIF  without any complications.  There is degenerative spurring of the ankle  joint and of the midfoot.  No acute abnormalities.   PATIENT SURVEYS:  LEFS  41/80    MUSCLE LENGTH: deferred  POSTURE: pes planus R with ER  PALPATION: deferred  LOWER EXTREMITY ROM:  A/PROM Right eval Left eval 05/18/24  Hip flexion     Hip extension     Hip abduction     Hip adduction     Hip internal rotation     Hip external rotation     Knee flexion     Knee extension     Ankle dorsiflexion 0/3d  0/2d  Ankle plantarflexion   WNL  Ankle inversion   25/30d  Ankle eversion   12/20d   (Blank rows = not tested)  LOWER EXTREMITY MMT:  MMT Right eval Left eval  Hip flexion    Hip extension    Hip abduction    Hip adduction    Hip internal rotation    Hip external rotation    Knee flexion    Knee extension    Ankle dorsiflexion 4-   Ankle plantarflexion 3   Ankle inversion    Ankle eversion     (Blank rows = not tested)  LOWER EXTREMITY SPECIAL TESTS:  Ankle special tests: Tinel's test-Posterior tibialis: positive   FUNCTIONAL TESTS:  30 seconds chair stand test 7  SLS 30s L, 10s R; 05/18/24 21s  GAIT: Distance walked: 97ftx2 Assistive device utilized: None Level of assistance: Complete Independence Comments: unremarkable                                                                                                                                TREATMENT: [facility] Adult PT Treatment:                                                DATE: 05/18/24  Therapeutic Activity: Re-assessment of goal progress, LEFS, HEP review and update  [facility] Adult PT Treatment:                                                DATE: 03/24/24 Therapeutic Exercise: Nustep L4 8 min 60 SPM Neuromuscular re-ed: Standing gastroc stretch w/supination bias 60s x2  B heel raise with tennis ball 15x2 3 way ankle RTB 15x2 Therapeutic Activity: Runners step 6 in 5# KB 15/15 L hip flexion and R trunk rotation against wall 15x2 from blue t-band pad Seated soleus raises 25# DB 50x focus on neutral foot   [facility] Adult PT Treatment:  DATE: 03/22/25 Therapeutic Exercise: Nustep L4 8 min 60 SPM Neuromuscular re-ed: Standing gastroc stertch w/supination bias 60s x2  B heel raise with tennis ball 15x2 3 way ankle RTB 15x2 Therapeutic Activity: Glute medius hip hike 15x B from airex pad L hip flexion and R trunk rotation against wall 15x2 from airex pad Seated soleus raises 25# DB 50x  [facility] Adult PT Treatment:                                                DATE: 03/01/24 Therapeutic Exercise: Nustep L4 8 min Neuromuscular re-ed: Standing gastroc stertch w/supination bias 60s x2  B heel raise with tennis ball 15x2 Therapeutic Activity: Glute medius hip hike 15x B L hip flexion and R trunk rotation against wall 15x Eversion mobs in prone  [facility] Adult PT Treatment:                                                DATE: 02/04/24  Self Care: Additional minutes spent for educating on updated Therapeutic Home Exercise Program as well as comparing current status to condition at start of symptoms. This included exercises focusing on stretching, strengthening, with focus on eccentric aspects. Long term goals include an improvement in range of motion, strength, endurance as well as avoiding reinjury. Patient's frequency would include in 1-2 times a day, 3-5 times a week for a duration of 6-12 weeks. Proper technique shown and discussed handout in great detail. All questions were discussed and addressed.     PATIENT EDUCATION:  Education details: Discussed eval findings, rehab rationale and POC and patient is in agreement  Person educated: Patient Education method: Explanation and Handouts Education comprehension: verbalized understanding and needs further education  HOME EXERCISE PROGRAM: Access Code: [email or web address] URL: [email or web address] Date: 05/18/2024 Prepared by: Reyes Kohut  Exercises - Gastroc Stretch on Wall  - 2 x daily - 5 x weekly - 1 sets - 2 reps - 30s hold - Soleus Stretch on Wall   - 2 x daily - 5 x weekly - 1 sets - 2 reps - 30s hold - Standing Bilateral Gastroc Stretch with Step  - 2 x daily - 5 x weekly - 1 sets - 2 reps - 60s hold - Heel Toe Raises with Counter Support  - 2 x daily - 5 x weekly - 2 sets - 15 reps  ASSESSMENT:  CLINICAL IMPRESSION: Progress made towards pain/soreness as well as static balance and strength via 30s chair stand test.  DF ROM unchanged and HEP updated to emphasize stretch.  Recommend 1w4 to improve DF ROM and consider night splint if stretching unsuccessful.  Patient is a 34 y.o. female who was seen today for physical therapy evaluation and treatment for R ankle pain.  Patient presents with decreased ROM into DF, INV/EV functional with hardware in place.  Strength and balance deficits identified.  R toe out noted due to pesplanus and hip tightness, mild discomfort with palpation of posterior tibialis tendon.  OBJECTIVE IMPAIRMENTS: Abnormal gait, decreased balance, decreased knowledge of condition, decreased mobility, decreased ROM, decreased strength, and pain.   ACTIVITY LIMITATIONS: carrying, lifting, sitting, standing, squatting, and stairs  PERSONAL FACTORS: Age, Past/current experiences, and Time since onset of injury/illness/exacerbation  are also affecting patient's functional outcome.   REHAB POTENTIAL: Good  CLINICAL DECISION MAKING: Stable/uncomplicated  EVALUATION COMPLEXITY: Moderate   GOALS: Goals reviewed with patient? No  SHORT TERM GOALS: Target date: 02/25/2024   Patient to demonstrate independence in HEP  Baseline: Access Code: [email or web address] Goal status: Met  2.  15s R SLS  Baseline: 10s; 05/18/24 21s Goal status: Met    LONG TERM GOALS: Target date: 07/16/24    Patient will acknowledge 2/10 pain at least once during episode of care   Baseline: 5/10; 05/18/24 4/10 Goal status: Ongoing  2.  Patient will score at least 60/80 on LEFS to signify clinically meaningful improvement in functional abilities.    Baseline: 41/80; 52/80 Goal status: Ongoing  3.  Patient will increase 30s chair stand reps from 7 to 10 without arms to demonstrate and improved functional ability with less pain/difficulty as well as reduce fall risk.  Baseline: 7; 05/18/24 11 Goal status: Met  4.  Increase AROM DF to 8d Baseline: 0d; 05/18/24 0d Goal status: Ongoing  5.  Increase R ankle strength to 4/5 Baseline:  Ankle dorsiflexion 4-   Ankle plantarflexion 3    Goal status: ongoing    PLAN:  PT FREQUENCY: 1x/week  PT DURATION: 4 weeks  PLANNED INTERVENTIONS: 97110-Therapeutic exercises, 97530- Therapeutic activity, 97112- Neuromuscular re-education, 97535- Self Care, [id number]- Manual therapy, [phone]- Gait training, Patient/Family education, Balance training, and Stair training  PLAN FOR NEXT SESSION: HEP review and update, manual techniques as appropriate, aerobic tasks, ROM and flexibility activities, strengthening and PREs, TPDN, gait and balance training as needed   For all possible CPT codes, reference the Planned Interventions line above.     Check all conditions that are expected to impact treatment: {Conditions expected to impact treatment:Structural or anatomic abnormalities and Complications related to surgery   If treatment provided at initial evaluation, no treatment charged due to lack of authorization.       Haidee Stogsdill M Tracey Hermance, PT 05/27/2024, 11:47 AM  "

## 2024-05-30 ENCOUNTER — Ambulatory Visit: Attending: Orthopaedic Surgery

## 2024-06-06 ENCOUNTER — Ambulatory Visit

## 2024-06-13 ENCOUNTER — Ambulatory Visit

## 2024-06-16 ENCOUNTER — Encounter: Admitting: Family Medicine

## 2024-06-20 ENCOUNTER — Ambulatory Visit
